# Patient Record
Sex: Male | Born: 1950 | Race: White | Hispanic: No | Smoking: Never smoker
Health system: Southern US, Community
[De-identification: ages and names within clinical notes are randomized; demographics above are authoritative.]

## PROBLEM LIST (undated history)

## (undated) DIAGNOSIS — Z974 Presence of external hearing-aid: Secondary | ICD-10-CM

## (undated) DIAGNOSIS — I10 Essential (primary) hypertension: Secondary | ICD-10-CM

## (undated) DIAGNOSIS — H3500 Unspecified background retinopathy: Secondary | ICD-10-CM

## (undated) DIAGNOSIS — K219 Gastro-esophageal reflux disease without esophagitis: Secondary | ICD-10-CM

## (undated) DIAGNOSIS — E785 Hyperlipidemia, unspecified: Secondary | ICD-10-CM

## (undated) DIAGNOSIS — M1990 Unspecified osteoarthritis, unspecified site: Secondary | ICD-10-CM

## (undated) HISTORY — DX: Essential (primary) hypertension: I10

## (undated) HISTORY — DX: Unspecified background retinopathy: H35.00

## (undated) HISTORY — PX: STAPEDES SURGERY: SHX789

## (undated) HISTORY — DX: Hyperlipidemia, unspecified: E78.5

## (undated) HISTORY — DX: Gastro-esophageal reflux disease without esophagitis: K21.9

---

## 2008-06-08 ENCOUNTER — Ambulatory Visit: Payer: Self-pay | Admitting: Unknown Physician Specialty

## 2008-08-26 ENCOUNTER — Ambulatory Visit: Payer: Self-pay | Admitting: Internal Medicine

## 2009-02-26 ENCOUNTER — Ambulatory Visit: Payer: Self-pay | Admitting: Internal Medicine

## 2010-09-15 ENCOUNTER — Emergency Department: Payer: Self-pay | Admitting: Emergency Medicine

## 2010-10-02 ENCOUNTER — Ambulatory Visit: Payer: Self-pay | Admitting: Surgery

## 2011-07-31 HISTORY — PX: COLONOSCOPY: SHX174

## 2011-08-11 ENCOUNTER — Ambulatory Visit: Payer: Self-pay

## 2011-08-11 LAB — URINALYSIS, COMPLETE
Bacteria: NEGATIVE
Bilirubin,UR: NEGATIVE
Glucose,UR: NEGATIVE mg/dL (ref 0–75)
Ketone: NEGATIVE
Specific Gravity: 1.015 (ref 1.003–1.030)
WBC UR: NONE SEEN /HPF (ref 0–5)

## 2011-08-11 LAB — OCCULT BLOOD X 1 CARD TO LAB, STOOL: Occult Blood, Feces: POSITIVE

## 2011-10-12 ENCOUNTER — Ambulatory Visit: Payer: Self-pay | Admitting: Unknown Physician Specialty

## 2011-10-15 LAB — PATHOLOGY REPORT

## 2014-03-01 DIAGNOSIS — H43819 Vitreous degeneration, unspecified eye: Secondary | ICD-10-CM | POA: Diagnosis not present

## 2014-03-08 DIAGNOSIS — Z79899 Other long term (current) drug therapy: Secondary | ICD-10-CM | POA: Diagnosis not present

## 2014-03-08 DIAGNOSIS — E785 Hyperlipidemia, unspecified: Secondary | ICD-10-CM | POA: Diagnosis not present

## 2014-03-08 DIAGNOSIS — I10 Essential (primary) hypertension: Secondary | ICD-10-CM | POA: Diagnosis not present

## 2014-03-08 DIAGNOSIS — K219 Gastro-esophageal reflux disease without esophagitis: Secondary | ICD-10-CM | POA: Diagnosis not present

## 2014-04-06 DIAGNOSIS — I10 Essential (primary) hypertension: Secondary | ICD-10-CM | POA: Diagnosis not present

## 2014-04-06 DIAGNOSIS — I498 Other specified cardiac arrhythmias: Secondary | ICD-10-CM | POA: Diagnosis not present

## 2014-04-06 DIAGNOSIS — E860 Dehydration: Secondary | ICD-10-CM | POA: Diagnosis not present

## 2014-04-09 ENCOUNTER — Ambulatory Visit: Payer: Self-pay | Admitting: Family Medicine

## 2014-04-09 DIAGNOSIS — R42 Dizziness and giddiness: Secondary | ICD-10-CM | POA: Diagnosis not present

## 2014-04-09 DIAGNOSIS — R7309 Other abnormal glucose: Secondary | ICD-10-CM | POA: Diagnosis not present

## 2014-04-09 LAB — CBC WITH DIFFERENTIAL/PLATELET
BASOS ABS: 0 10*3/uL (ref 0.0–0.1)
Basophil %: 0.4 %
EOS ABS: 0.1 10*3/uL (ref 0.0–0.7)
EOS PCT: 1.4 %
HCT: 50.7 % (ref 40.0–52.0)
HGB: 16.5 g/dL (ref 13.0–18.0)
Lymphocyte #: 1.8 10*3/uL (ref 1.0–3.6)
Lymphocyte %: 16.7 %
MCH: 29.5 pg (ref 26.0–34.0)
MCHC: 32.5 g/dL (ref 32.0–36.0)
MCV: 91 fL (ref 80–100)
MONO ABS: 0.9 x10 3/mm (ref 0.2–1.0)
Monocyte %: 8.4 %
Neutrophil #: 8 10*3/uL — ABNORMAL HIGH (ref 1.4–6.5)
Neutrophil %: 73.1 %
Platelet: 314 10*3/uL (ref 150–440)
RBC: 5.59 10*6/uL (ref 4.40–5.90)
RDW: 14 % (ref 11.5–14.5)
WBC: 10.9 10*3/uL — ABNORMAL HIGH (ref 3.8–10.6)

## 2014-04-09 LAB — RENAL FUNCTION PANEL
ANION GAP: 9 (ref 7–16)
Albumin: 4.1 g/dL (ref 3.4–5.0)
BUN: 14 mg/dL (ref 7–18)
CHLORIDE: 98 mmol/L (ref 98–107)
CO2: 30 mmol/L (ref 21–32)
CREATININE: 1.27 mg/dL (ref 0.60–1.30)
Calcium, Total: 9.3 mg/dL (ref 8.5–10.1)
EGFR (African American): >60
EGFR (Non-African Amer.): 59 — ABNORMAL LOW
Glucose: 129 mg/dL — ABNORMAL HIGH (ref 65–99)
OSMOLALITY: 276 (ref 275–301)
Phosphorus: 3 mg/dL (ref 2.5–4.9)
Potassium: 4.4 mmol/L (ref 3.5–5.1)
Sodium: 137 mmol/L (ref 136–145)

## 2014-04-09 LAB — HEMOGLOBIN A1C: HEMOGLOBIN A1C: 5.9 % (ref 4.2–6.3)

## 2014-04-14 DIAGNOSIS — H43819 Vitreous degeneration, unspecified eye: Secondary | ICD-10-CM | POA: Diagnosis not present

## 2014-04-14 DIAGNOSIS — H33309 Unspecified retinal break, unspecified eye: Secondary | ICD-10-CM | POA: Diagnosis not present

## 2014-04-14 DIAGNOSIS — H35719 Central serous chorioretinopathy, unspecified eye: Secondary | ICD-10-CM | POA: Diagnosis not present

## 2014-04-20 DIAGNOSIS — R42 Dizziness and giddiness: Secondary | ICD-10-CM | POA: Diagnosis not present

## 2014-04-20 DIAGNOSIS — J309 Allergic rhinitis, unspecified: Secondary | ICD-10-CM | POA: Diagnosis not present

## 2014-04-20 DIAGNOSIS — H902 Conductive hearing loss, unspecified: Secondary | ICD-10-CM | POA: Diagnosis not present

## 2014-04-20 DIAGNOSIS — H6120 Impacted cerumen, unspecified ear: Secondary | ICD-10-CM | POA: Diagnosis not present

## 2014-04-20 DIAGNOSIS — H8010 Otosclerosis involving oval window, obliterative, unspecified ear: Secondary | ICD-10-CM | POA: Diagnosis not present

## 2014-05-12 DIAGNOSIS — H33002 Unspecified retinal detachment with retinal break, left eye: Secondary | ICD-10-CM | POA: Diagnosis not present

## 2014-05-14 DIAGNOSIS — Z23 Encounter for immunization: Secondary | ICD-10-CM | POA: Diagnosis not present

## 2014-08-24 DIAGNOSIS — H6120 Impacted cerumen, unspecified ear: Secondary | ICD-10-CM | POA: Diagnosis not present

## 2014-08-24 DIAGNOSIS — H903 Sensorineural hearing loss, bilateral: Secondary | ICD-10-CM | POA: Diagnosis not present

## 2014-08-26 ENCOUNTER — Ambulatory Visit: Payer: Self-pay | Admitting: Family Medicine

## 2014-08-26 DIAGNOSIS — I10 Essential (primary) hypertension: Secondary | ICD-10-CM | POA: Diagnosis not present

## 2014-08-26 DIAGNOSIS — R000 Tachycardia, unspecified: Secondary | ICD-10-CM | POA: Diagnosis not present

## 2014-08-26 DIAGNOSIS — R6883 Chills (without fever): Secondary | ICD-10-CM | POA: Diagnosis not present

## 2014-08-26 DIAGNOSIS — K219 Gastro-esophageal reflux disease without esophagitis: Secondary | ICD-10-CM | POA: Diagnosis not present

## 2014-08-26 LAB — CBC WITH DIFFERENTIAL/PLATELET
BASOS PCT: 0.8 %
Basophil #: 0.1 10*3/uL (ref 0.0–0.1)
EOS ABS: 0.4 10*3/uL (ref 0.0–0.7)
Eosinophil %: 4.1 %
HCT: 45.1 % (ref 40.0–52.0)
HGB: 15.2 g/dL (ref 13.0–18.0)
Lymphocyte #: 2.8 10*3/uL (ref 1.0–3.6)
Lymphocyte %: 28.8 %
MCH: 29.3 pg (ref 26.0–34.0)
MCHC: 33.8 g/dL (ref 32.0–36.0)
MCV: 87 fL (ref 80–100)
MONO ABS: 1 x10 3/mm (ref 0.2–1.0)
Monocyte %: 9.7 %
NEUTROS ABS: 5.6 10*3/uL (ref 1.4–6.5)
Neutrophil %: 56.6 %
Platelet: 261 10*3/uL (ref 150–440)
RBC: 5.2 10*6/uL (ref 4.40–5.90)
RDW: 14.7 % — AB (ref 11.5–14.5)
WBC: 9.8 10*3/uL (ref 3.8–10.6)

## 2014-08-26 LAB — BASIC METABOLIC PANEL
ANION GAP: 10 (ref 7–16)
BUN: 16 mg/dL (ref 7–18)
CALCIUM: 8.8 mg/dL (ref 8.5–10.1)
CHLORIDE: 101 mmol/L (ref 98–107)
Co2: 26 mmol/L (ref 21–32)
Creatinine: 1.5 mg/dL — ABNORMAL HIGH (ref 0.60–1.30)
EGFR (African American): >60
EGFR (Non-African Amer.): 50 — ABNORMAL LOW
Glucose: 149 mg/dL — ABNORMAL HIGH (ref 65–99)
Osmolality: 278 (ref 275–301)
POTASSIUM: 3.7 mmol/L (ref 3.5–5.1)
Sodium: 137 mmol/L (ref 136–145)

## 2014-08-26 LAB — URINALYSIS, COMPLETE
BACTERIA: NEGATIVE
Bilirubin,UR: NEGATIVE
Blood: NEGATIVE
GLUCOSE, UR: NEGATIVE
Ketone: NEGATIVE
LEUKOCYTE ESTERASE: NEGATIVE
Nitrite: NEGATIVE
Ph: 6 (ref 5.0–8.0)
Protein: NEGATIVE
Specific Gravity: 1.005 (ref 1.000–1.030)
Squamous Epithelial: NONE SEEN
Transitional Epi: NONE SEEN
WBC UR: NONE SEEN /HPF (ref 0–5)

## 2014-08-26 LAB — TSH: Thyroid Stimulating Horm: 2.36 u[IU]/mL

## 2014-08-26 LAB — RAPID INFLUENZA A&B ANTIGENS

## 2014-09-14 DIAGNOSIS — R351 Nocturia: Secondary | ICD-10-CM | POA: Diagnosis not present

## 2014-09-14 DIAGNOSIS — E784 Other hyperlipidemia: Secondary | ICD-10-CM | POA: Diagnosis not present

## 2014-09-14 DIAGNOSIS — L309 Dermatitis, unspecified: Secondary | ICD-10-CM | POA: Diagnosis not present

## 2014-09-14 DIAGNOSIS — K219 Gastro-esophageal reflux disease without esophagitis: Secondary | ICD-10-CM | POA: Diagnosis not present

## 2014-09-14 DIAGNOSIS — I10 Essential (primary) hypertension: Secondary | ICD-10-CM | POA: Diagnosis not present

## 2014-09-14 DIAGNOSIS — Z1389 Encounter for screening for other disorder: Secondary | ICD-10-CM | POA: Diagnosis not present

## 2014-09-17 DIAGNOSIS — H33002 Unspecified retinal detachment with retinal break, left eye: Secondary | ICD-10-CM | POA: Diagnosis not present

## 2015-03-21 ENCOUNTER — Encounter: Payer: Self-pay | Admitting: Family Medicine

## 2015-03-21 ENCOUNTER — Ambulatory Visit (INDEPENDENT_AMBULATORY_CARE_PROVIDER_SITE_OTHER): Payer: Medicare Other | Admitting: Family Medicine

## 2015-03-21 ENCOUNTER — Other Ambulatory Visit: Payer: Self-pay

## 2015-03-21 VITALS — BP 130/80 | HR 80 | Ht 72.0 in | Wt 242.0 lb

## 2015-03-21 DIAGNOSIS — I10 Essential (primary) hypertension: Secondary | ICD-10-CM | POA: Insufficient documentation

## 2015-03-21 DIAGNOSIS — E785 Hyperlipidemia, unspecified: Secondary | ICD-10-CM | POA: Diagnosis not present

## 2015-03-21 DIAGNOSIS — H33002 Unspecified retinal detachment with retinal break, left eye: Secondary | ICD-10-CM | POA: Diagnosis not present

## 2015-03-21 DIAGNOSIS — K2100 Gastro-esophageal reflux disease with esophagitis, without bleeding: Secondary | ICD-10-CM | POA: Insufficient documentation

## 2015-03-21 MED ORDER — LANSOPRAZOLE 30 MG PO CPDR: 30.0000 mg | DELAYED_RELEASE_CAPSULE | Freq: Every day | ORAL | Status: DC

## 2015-03-21 MED ORDER — AMLODIPINE BESY-BENAZEPRIL HCL 10-20 MG PO CAPS: 1.0000 | ORAL_CAPSULE | Freq: Every day | ORAL | Status: DC

## 2015-03-21 MED ORDER — ATORVASTATIN CALCIUM 10 MG PO TABS
10.0000 mg | ORAL_TABLET | Freq: Every day | ORAL | Status: DC
Start: 2015-03-21 — End: 2015-09-19

## 2015-03-21 NOTE — Progress Notes (Signed)
Name: Juan Moreno   MRN: [id number]    DOB: [birth year]   Date:03/21/2015       Progress Note  Subjective  Chief Complaint  Chief Complaint  Patient presents with  . Gastrophageal Reflux  . Hypertension  . Hyperlipidemia    Gastrophageal Reflux He complains of heartburn. He reports no abdominal pain, no belching, no chest pain, no choking, no coughing, no dysphagia, no early satiety, no globus sensation, no hoarse voice, no nausea, no sore throat, no stridor, no tooth decay, no water brash or no wheezing. This is a recurrent problem. The current episode started more than 1 year ago. The problem occurs rarely. The problem has been gradually improving. The heartburn is located in the substernum. The heartburn is of mild intensity. The heartburn does not wake him from sleep. The heartburn does not limit his activity. The heartburn doesn't change with position. Nothing aggravates the symptoms. Pertinent negatives include no anemia, fatigue, melena, muscle weakness, orthopnea or weight loss. There are no known risk factors. Past procedures include an EGD.  Hypertension This is a chronic problem. The current episode started more than 1 year ago. The problem has been gradually improving since onset. Pertinent negatives include no anxiety, blurred vision, chest pain, headaches, malaise/fatigue, neck pain, orthopnea, palpitations, peripheral edema, PND, shortness of breath or sweats. There are no associated agents to hypertension. Risk factors for coronary artery disease include dyslipidemia and obesity. The current treatment provides moderate improvement. There are no compliance problems.  There is no history of angina, kidney disease, CAD/MI, CVA, heart failure, left ventricular hypertrophy, PVD, renovascular disease or retinopathy. There is no history of chronic renal disease.  Hyperlipidemia This is a chronic problem. The current episode started more than 1 year ago. The problem is controlled.  Recent lipid tests were reviewed and are normal. He has no history of chronic renal disease. There are no known factors aggravating his hyperlipidemia. Pertinent negatives include no chest pain, focal sensory loss, focal weakness, myalgias or shortness of breath. Current antihyperlipidemic treatment includes statins. The current treatment provides moderate improvement of lipids.    No problem-specific assessment & plan notes found for this encounter.   Past Medical History  Diagnosis Date  . Hypertension   . Hyperlipidemia   . GERD (gastroesophageal reflux disease)     Past Surgical History  Procedure Laterality Date  . Stapedes surgery    . Colonoscopy  2013    cleared for 4 yrs- Dr Vira Agar- [facility]    History reviewed. No pertinent family history.  Social History   Social History  . Marital Status: Married    Spouse Name: N/A  . Number of Children: N/A  . Years of Education: N/A   Occupational History  . Not on file.   Social History Main Topics  . Smoking status: Never Smoker   . Smokeless tobacco: Not on file  . Alcohol Use: 0.0 oz/week    0 Standard drinks or equivalent per week  . Drug Use: No  . Sexual Activity: No   Other Topics Concern  . Not on file   Social History Narrative  . No narrative on file    Allergies  Allergen Reactions  . Penicillins Rash     Review of Systems  Constitutional: Negative for fever, chills, weight loss, malaise/fatigue and fatigue.  HENT: Negative for ear discharge, ear pain, hoarse voice and sore throat.   Eyes: Negative for blurred vision.  Respiratory: Negative for cough, sputum production, choking, shortness  of breath and wheezing.   Cardiovascular: Negative for chest pain, palpitations, orthopnea, leg swelling and PND.  Gastrointestinal: Positive for heartburn. Negative for dysphagia, nausea, abdominal pain, diarrhea, constipation, blood in stool and melena.  Genitourinary: Negative for dysuria, urgency, frequency  and hematuria.  Musculoskeletal: Negative for myalgias, back pain, joint pain, muscle weakness and neck pain.  Skin: Negative for rash.  Neurological: Negative for dizziness, tingling, sensory change, focal weakness and headaches.  Endo/Heme/Allergies: Negative for environmental allergies and polydipsia. Does not bruise/bleed easily.  Psychiatric/Behavioral: Negative for depression and suicidal ideas. The patient is not nervous/anxious and does not have insomnia.      Objective  Filed Vitals:   03/21/15 0803  BP: 130/80  Pulse: 80  Height: 6' (1.829 m)  Weight: 242 lb (109.77 kg)    Physical Exam  Constitutional: He is oriented to person, place, and time and well-developed, well-nourished, and in no distress.  HENT:  Head: Normocephalic.  Right Ear: External ear normal.  Left Ear: External ear normal.  Nose: Nose normal.  Mouth/Throat: Oropharynx is clear and moist.  Eyes: Conjunctivae and EOM are normal. Pupils are equal, round, and reactive to light. Right eye exhibits no discharge. Left eye exhibits no discharge. No scleral icterus.  Neck: Normal range of motion. Neck supple. No JVD present. No tracheal deviation present. No thyromegaly present.  Cardiovascular: Normal rate, regular rhythm, normal heart sounds and intact distal pulses.  Exam reveals no gallop and no friction rub.   No murmur heard. Pulmonary/Chest: Breath sounds normal. No respiratory distress. He has no wheezes. He has no rales.  Abdominal: Soft. Bowel sounds are normal. He exhibits no mass. There is no hepatosplenomegaly. There is no tenderness. There is no rebound, no guarding and no CVA tenderness.  Musculoskeletal: Normal range of motion. He exhibits no edema or tenderness.  Lymphadenopathy:    He has no cervical adenopathy.  Neurological: He is alert and oriented to person, place, and time. He has normal sensation, normal strength, normal reflexes and intact cranial nerves. No cranial nerve deficit.   Skin: Skin is warm. No rash noted.  Psychiatric: Mood and affect normal.      Assessment & Plan  Problem List Items Addressed This Visit      Cardiovascular and Mediastinum   Essential hypertension - Primary   Relevant Medications   amLODipine-benazepril (LOTREL) 10-20 MG per capsule   atorvastatin (LIPITOR) 10 MG tablet   Other Relevant Orders   Renal Function Panel     Digestive   Gastroesophageal reflux disease with esophagitis   Relevant Medications   lansoprazole (PREVACID) 30 MG capsule     Other   Hyperlipidemia   Relevant Medications   amLODipine-benazepril (LOTREL) 10-20 MG per capsule   atorvastatin (LIPITOR) 10 MG tablet   Other Relevant Orders   Lipid Profile        Dr. Otilio Miu [facility]  03/21/2015

## 2015-03-22 LAB — LIPID PANEL
Chol/HDL Ratio: 2.9 ratio units (ref 0.0–5.0)
Cholesterol, Total: 127 mg/dL (ref 100–199)
HDL: 44 mg/dL (ref >39)
LDL Calculated: 61 mg/dL (ref 0–99)
TRIGLYCERIDES: 110 mg/dL (ref 0–149)
VLDL Cholesterol Cal: 22 mg/dL (ref 5–40)

## 2015-03-22 LAB — RENAL FUNCTION PANEL
Albumin: 4.5 g/dL (ref 3.6–4.8)
BUN/Creatinine Ratio: 10 (ref 10–22)
BUN: 12 mg/dL (ref 8–27)
CALCIUM: 9.3 mg/dL (ref 8.6–10.2)
CHLORIDE: 94 mmol/L — AB (ref 97–108)
CO2: 21 mmol/L (ref 18–29)
Creatinine, Ser: 1.21 mg/dL (ref 0.76–1.27)
GFR calc Af Amer: 72 mL/min/{1.73_m2} (ref >59)
GFR calc non Af Amer: 62 mL/min/{1.73_m2} (ref >59)
GLUCOSE: 71 mg/dL (ref 65–99)
PHOSPHORUS: 2.6 mg/dL (ref 2.5–4.5)
POTASSIUM: 4.4 mmol/L (ref 3.5–5.2)
SODIUM: 138 mmol/L (ref 134–144)

## 2015-04-11 ENCOUNTER — Ambulatory Visit (INDEPENDENT_AMBULATORY_CARE_PROVIDER_SITE_OTHER): Payer: Medicare Other | Admitting: Family Medicine

## 2015-04-11 ENCOUNTER — Encounter: Payer: Self-pay | Admitting: Family Medicine

## 2015-04-11 VITALS — BP 130/78 | HR 80 | Ht 72.0 in | Wt 247.0 lb

## 2015-04-11 DIAGNOSIS — R079 Chest pain, unspecified: Secondary | ICD-10-CM

## 2015-04-11 DIAGNOSIS — D229 Melanocytic nevi, unspecified: Secondary | ICD-10-CM

## 2015-04-11 DIAGNOSIS — E669 Obesity, unspecified: Secondary | ICD-10-CM | POA: Diagnosis not present

## 2015-04-11 DIAGNOSIS — E785 Hyperlipidemia, unspecified: Secondary | ICD-10-CM

## 2015-04-11 DIAGNOSIS — I10 Essential (primary) hypertension: Secondary | ICD-10-CM | POA: Diagnosis not present

## 2015-04-11 DIAGNOSIS — R05 Cough: Secondary | ICD-10-CM

## 2015-04-11 DIAGNOSIS — R059 Cough, unspecified: Secondary | ICD-10-CM

## 2015-04-11 MED ORDER — AMLODIPINE BESYLATE 10 MG PO TABS: 10.0000 mg | ORAL_TABLET | Freq: Every day | ORAL | Status: DC

## 2015-04-11 MED ORDER — LOSARTAN POTASSIUM 50 MG PO TABS: 50.0000 mg | ORAL_TABLET | Freq: Every day | ORAL | Status: DC

## 2015-04-11 NOTE — Progress Notes (Signed)
Name: Juan Moreno   MRN: [id number]    DOB: [birth year]   Date:04/11/2015       Progress Note  Subjective  Chief Complaint  Chief Complaint  Patient presents with  . Chest Pain    Chest Pain  This is a recurrent problem. The current episode started more than 1 year ago. The onset quality is gradual. The problem occurs intermittently. The problem has been waxing and waning. The pain is present in the lateral region. The pain is mild. Quality: "dull ache" The pain does not radiate. Associated symptoms include a cough. Pertinent negatives include no abdominal pain, back pain, diaphoresis, dizziness, exertional chest pressure, fever, headaches, irregular heartbeat, lower extremity edema, malaise/fatigue, nausea, near-syncope, palpitations, PND, shortness of breath or sputum production. Risk factors include male gender and obesity.  His past medical history is significant for hyperlipidemia and hypertension.  Pertinent negatives for past medical history include no CAD, no MI and no PE.    No problem-specific assessment & plan notes found for this encounter.   Past Medical History  Diagnosis Date  . Hypertension   . Hyperlipidemia   . GERD (gastroesophageal reflux disease)     Past Surgical History  Procedure Laterality Date  . Stapedes surgery    . Colonoscopy  2013    cleared for 4 yrs- Dr Vira Agar- [facility]    No family history on file.  Social History   Social History  . Marital Status: Married    Spouse Name: N/A  . Number of Children: N/A  . Years of Education: N/A   Occupational History  . Not on file.   Social History Main Topics  . Smoking status: Never Smoker   . Smokeless tobacco: Not on file  . Alcohol Use: 0.0 oz/week    0 Standard drinks or equivalent per week  . Drug Use: No  . Sexual Activity: No   Other Topics Concern  . Not on file   Social History Narrative    Allergies  Allergen Reactions  . Penicillins Rash     Review of Systems    Constitutional: Negative for fever, chills, weight loss, malaise/fatigue and diaphoresis.  HENT: Negative for ear discharge, ear pain and sore throat.   Eyes: Negative for blurred vision.  Respiratory: Positive for cough. Negative for sputum production, shortness of breath and wheezing.   Cardiovascular: Positive for chest pain. Negative for palpitations, leg swelling, PND and near-syncope.  Gastrointestinal: Negative for heartburn, nausea, abdominal pain, diarrhea, constipation, blood in stool and melena.  Genitourinary: Negative for dysuria, urgency, frequency and hematuria.  Musculoskeletal: Negative for myalgias, back pain, joint pain and neck pain.  Skin: Negative for rash.  Neurological: Negative for dizziness, tingling, sensory change, focal weakness and headaches.  Endo/Heme/Allergies: Negative for environmental allergies and polydipsia. Does not bruise/bleed easily.  Psychiatric/Behavioral: Negative for depression and suicidal ideas. The patient is not nervous/anxious and does not have insomnia.      Objective  Filed Vitals:   04/11/15 1459  BP: 130/78  Pulse: 80  Height: 6' (1.829 m)  Weight: 247 lb (112.038 kg)    Physical Exam  Constitutional: He is oriented to person, place, and time and well-developed, well-nourished, and in no distress.  HENT:  Head: Normocephalic.  Right Ear: External ear normal.  Left Ear: External ear normal.  Nose: Nose normal.  Mouth/Throat: Oropharynx is clear and moist.  Eyes: Conjunctivae and EOM are normal. Pupils are equal, round, and reactive to light. Right eye exhibits no  discharge. Left eye exhibits no discharge. No scleral icterus.  Neck: Normal range of motion. Neck supple. No JVD present. No tracheal deviation present. No thyromegaly present.  Cardiovascular: Normal rate, regular rhythm, normal heart sounds and intact distal pulses.  Exam reveals no gallop and no friction rub.   No murmur heard. Pulmonary/Chest: Breath sounds  normal. No respiratory distress. He has no wheezes. He has no rales.  Abdominal: Soft. Bowel sounds are normal. He exhibits no mass. There is no hepatosplenomegaly. There is no tenderness. There is no rebound, no guarding and no CVA tenderness.  Musculoskeletal: Normal range of motion. He exhibits no edema or tenderness.  No chest wall tenderness  Lymphadenopathy:    He has no cervical adenopathy.  Neurological: He is alert and oriented to person, place, and time. He has normal sensation, normal strength, normal reflexes and intact cranial nerves. No cranial nerve deficit.  Skin: Skin is warm. Lesion noted. No rash noted.     Nodular base with partial pigmentation  Psychiatric: Mood and affect normal.      Assessment & Plan  Problem List Items Addressed This Visit      Cardiovascular and Mediastinum   Essential hypertension   Relevant Medications   amLODipine (NORVASC) 10 MG tablet   losartan (COZAAR) 50 MG tablet     Other   Hyperlipidemia   Relevant Medications   amLODipine (NORVASC) 10 MG tablet   losartan (COZAAR) 50 MG tablet   Obesity    Other Visit Diagnoses    Chest pain at rest    -  Primary    Relevant Orders    EKG 12-Lead (Completed)    Ambulatory referral to Cardiology    Pigmented nevus        Relevant Orders    Ambulatory referral to Dermatology    Cough        likely ACE         Dr. Deanna Jones [facility]  04/11/2015

## 2015-04-14 DIAGNOSIS — L570 Actinic keratosis: Secondary | ICD-10-CM | POA: Diagnosis not present

## 2015-04-14 DIAGNOSIS — L821 Other seborrheic keratosis: Secondary | ICD-10-CM | POA: Diagnosis not present

## 2015-04-28 DIAGNOSIS — I209 Angina pectoris, unspecified: Secondary | ICD-10-CM | POA: Diagnosis not present

## 2015-04-28 DIAGNOSIS — E782 Mixed hyperlipidemia: Secondary | ICD-10-CM | POA: Diagnosis not present

## 2015-04-28 DIAGNOSIS — I495 Sick sinus syndrome: Secondary | ICD-10-CM | POA: Diagnosis not present

## 2015-04-28 DIAGNOSIS — I10 Essential (primary) hypertension: Secondary | ICD-10-CM | POA: Diagnosis not present

## 2015-05-12 DIAGNOSIS — I495 Sick sinus syndrome: Secondary | ICD-10-CM | POA: Diagnosis not present

## 2015-05-17 DIAGNOSIS — I495 Sick sinus syndrome: Secondary | ICD-10-CM | POA: Diagnosis not present

## 2015-05-17 DIAGNOSIS — E782 Mixed hyperlipidemia: Secondary | ICD-10-CM | POA: Diagnosis not present

## 2015-05-17 DIAGNOSIS — R0789 Other chest pain: Secondary | ICD-10-CM | POA: Diagnosis not present

## 2015-05-17 DIAGNOSIS — I209 Angina pectoris, unspecified: Secondary | ICD-10-CM | POA: Diagnosis not present

## 2015-05-17 DIAGNOSIS — I10 Essential (primary) hypertension: Secondary | ICD-10-CM | POA: Diagnosis not present

## 2015-08-22 ENCOUNTER — Other Ambulatory Visit: Payer: Self-pay

## 2015-08-29 ENCOUNTER — Ambulatory Visit (INDEPENDENT_AMBULATORY_CARE_PROVIDER_SITE_OTHER): Payer: Medicare Other | Admitting: Family Medicine

## 2015-08-29 ENCOUNTER — Encounter: Payer: Self-pay | Admitting: Family Medicine

## 2015-08-29 ENCOUNTER — Other Ambulatory Visit: Payer: Self-pay

## 2015-08-29 VITALS — BP 130/80 | HR 98 | Temp 98.0°F | Ht 72.0 in | Wt 245.0 lb

## 2015-08-29 DIAGNOSIS — J40 Bronchitis, not specified as acute or chronic: Secondary | ICD-10-CM | POA: Diagnosis not present

## 2015-08-29 DIAGNOSIS — J0100 Acute maxillary sinusitis, unspecified: Secondary | ICD-10-CM

## 2015-08-29 MED ORDER — GUAIFENESIN-CODEINE 100-10 MG/5ML PO SYRP: 5.0000 mL | ORAL_SOLUTION | Freq: Three times a day (TID) | ORAL | Status: DC | PRN

## 2015-08-29 MED ORDER — AZITHROMYCIN 250 MG PO TABS: ORAL_TABLET | ORAL | Status: DC

## 2015-08-29 NOTE — Progress Notes (Addendum)
Name: Juan Moreno   MRN: [id number]    DOB: 04/20/1951   Date:08/29/2015       Progress Note  Subjective  Chief Complaint  Chief Complaint  Patient presents with  . Sinusitis    cough and cong- thick production    Sinusitis This is a new problem. The current episode started in the past 7 days. The problem has been gradually worsening since onset. There has been no fever. He is experiencing no pain. Associated symptoms include congestion, coughing, sinus pressure and a sore throat. Pertinent negatives include no chills, diaphoresis, ear pain, headaches, hoarse voice, neck pain, shortness of breath, sneezing or swollen glands. Past treatments include nothing. The treatment provided mild relief.  Cough This is a new problem. The current episode started in the past 7 days. The problem has been gradually worsening. The cough is non-productive. Associated symptoms include a sore throat. Pertinent negatives include no chest pain, chills, ear congestion, ear pain, fever, headaches, heartburn, myalgias, rash, shortness of breath, weight loss or wheezing. There is no history of environmental allergies.    No problem-specific assessment & plan notes found for this encounter.   Past Medical History  Diagnosis Date  . Hypertension   . Hyperlipidemia   . GERD (gastroesophageal reflux disease)     Past Surgical History  Procedure Laterality Date  . Stapedes surgery    . Colonoscopy  2013    cleared for 4 yrs- Dr Vira Agar- [facility]    History reviewed. No pertinent family history.  Social History   Social History  . Marital Status: Married    Spouse Name: N/A  . Number of Children: N/A  . Years of Education: N/A   Occupational History  . Not on file.   Social History Main Topics  . Smoking status: Never Smoker   . Smokeless tobacco: Not on file  . Alcohol Use: 0.0 oz/week    0 Standard drinks or equivalent per week  . Drug Use: No  . Sexual Activity: No   Other Topics  Concern  . Not on file   Social History Narrative    Allergies  Allergen Reactions  . Penicillins Rash     Review of Systems  Constitutional: Negative for fever, chills, weight loss, malaise/fatigue and diaphoresis.  HENT: Positive for congestion, sinus pressure and sore throat. Negative for ear discharge, ear pain, hoarse voice and sneezing.   Eyes: Negative for blurred vision.  Respiratory: Positive for cough. Negative for sputum production, shortness of breath and wheezing.   Cardiovascular: Negative for chest pain, palpitations and leg swelling.  Gastrointestinal: Negative for heartburn, nausea, abdominal pain, diarrhea, constipation, blood in stool and melena.  Genitourinary: Negative for dysuria, urgency, frequency and hematuria.  Musculoskeletal: Negative for myalgias, back pain, joint pain and neck pain.  Skin: Negative for rash.  Neurological: Negative for dizziness, tingling, sensory change, focal weakness and headaches.  Endo/Heme/Allergies: Negative for environmental allergies and polydipsia. Does not bruise/bleed easily.  Psychiatric/Behavioral: Negative for depression and suicidal ideas. The patient is not nervous/anxious and does not have insomnia.      Objective  Filed Vitals:   08/29/15 1053  BP: 130/80  Pulse: 98  Temp: 98 F (36.7 C)  TempSrc: Oral  Height: 6' (1.829 m)  Weight: 245 lb (111.131 kg)    Physical Exam  Constitutional: He is oriented to person, place, and time and well-developed, well-nourished, and in no distress.  HENT:  Head: Normocephalic.  Right Ear: Tympanic membrane, external ear and  ear canal normal.  Left Ear: Tympanic membrane, external ear and ear canal normal.  Nose: Nose normal.  Mouth/Throat: Oropharynx is clear and moist.  Eyes: Conjunctivae and EOM are normal. Pupils are equal, round, and reactive to light. Right eye exhibits no discharge. Left eye exhibits no discharge. No scleral icterus.  Neck: Normal range of  motion. Neck supple. No JVD present. No tracheal deviation present. No thyromegaly present.  Cardiovascular: Normal rate, regular rhythm, normal heart sounds and intact distal pulses.  Exam reveals no gallop and no friction rub.   No murmur heard. Pulmonary/Chest: Breath sounds normal. No respiratory distress. He has no wheezes. He has no rales.  Abdominal: Soft. Bowel sounds are normal. He exhibits no mass. There is no hepatosplenomegaly. There is no tenderness. There is no rebound, no guarding and no CVA tenderness.  Musculoskeletal: Normal range of motion. He exhibits no edema or tenderness.  Lymphadenopathy:    He has no cervical adenopathy.  Neurological: He is alert and oriented to person, place, and time. He has normal sensation, normal strength, normal reflexes and intact cranial nerves. No cranial nerve deficit.  Skin: Skin is warm. No rash noted.  Psychiatric: Mood and affect normal.  Nursing note and vitals reviewed.     Assessment & Plan  Problem List Items Addressed This Visit    None    Visit Diagnoses    Bronchitis    -  Primary    Relevant Medications    azithromycin (ZITHROMAX) 250 MG tablet    Acute maxillary sinusitis, recurrence not specified        Relevant Medications    azithromycin (ZITHROMAX) 250 MG tablet    guaiFENesin-codeine (ROBITUSSIN AC) 100-10 MG/5ML syrup         Dr. Macon [facility]  08/29/2015

## 2015-09-19 ENCOUNTER — Ambulatory Visit (INDEPENDENT_AMBULATORY_CARE_PROVIDER_SITE_OTHER): Payer: Medicare Other | Admitting: Family Medicine

## 2015-09-19 ENCOUNTER — Encounter: Payer: Self-pay | Admitting: Family Medicine

## 2015-09-19 VITALS — BP 138/80 | HR 90 | Ht 72.0 in | Wt 247.0 lb

## 2015-09-19 DIAGNOSIS — K2100 Gastro-esophageal reflux disease with esophagitis, without bleeding: Secondary | ICD-10-CM

## 2015-09-19 DIAGNOSIS — Z23 Encounter for immunization: Secondary | ICD-10-CM | POA: Diagnosis not present

## 2015-09-19 DIAGNOSIS — I10 Essential (primary) hypertension: Secondary | ICD-10-CM | POA: Diagnosis not present

## 2015-09-19 DIAGNOSIS — Z1159 Encounter for screening for other viral diseases: Secondary | ICD-10-CM

## 2015-09-19 DIAGNOSIS — E785 Hyperlipidemia, unspecified: Secondary | ICD-10-CM | POA: Diagnosis not present

## 2015-09-19 MED ORDER — AMLODIPINE BESYLATE 10 MG PO TABS: 10.0000 mg | ORAL_TABLET | Freq: Every day | ORAL | Status: DC

## 2015-09-19 MED ORDER — LOSARTAN POTASSIUM 50 MG PO TABS: 50.0000 mg | ORAL_TABLET | Freq: Every day | ORAL | Status: DC

## 2015-09-19 MED ORDER — ATORVASTATIN CALCIUM 10 MG PO TABS: 10.0000 mg | ORAL_TABLET | Freq: Every day | ORAL | Status: DC

## 2015-09-19 MED ORDER — PANTOPRAZOLE SODIUM 40 MG PO TBEC: 40.0000 mg | DELAYED_RELEASE_TABLET | Freq: Every day | ORAL | Status: DC

## 2015-09-19 NOTE — Progress Notes (Signed)
Name: Juan Moreno   MRN: [id number]    DOB: 1951/04/10   Date:09/19/2015       Progress Note  Subjective  Chief Complaint  Chief Complaint  Patient presents with  . Hypertension  . Hyperlipidemia  . Gastroesophageal Reflux    change Prevacid to Protonix due to insurance    Hypertension This is a chronic problem. The current episode started more than 1 year ago. The problem is unchanged. The problem is controlled. Pertinent negatives include no anxiety, blurred vision, chest pain, headaches, malaise/fatigue, neck pain, orthopnea, palpitations, peripheral edema, PND, shortness of breath or sweats. There are no associated agents to hypertension. There are no known risk factors for coronary artery disease. Past treatments include calcium channel blockers and angiotensin blockers. The current treatment provides mild improvement. There are no compliance problems.  There is no history of angina, kidney disease, CAD/MI, CVA, heart failure, left ventricular hypertrophy, PVD, renovascular disease or retinopathy. There is no history of chronic renal disease or a hypertension causing med.  Hyperlipidemia This is a chronic problem. The current episode started more than 1 month ago. The problem is controlled. He has no history of chronic renal disease. There are no known factors aggravating his hyperlipidemia. Pertinent negatives include no chest pain, focal sensory loss, focal weakness, leg pain, myalgias or shortness of breath. Current antihyperlipidemic treatment includes statins. The current treatment provides moderate improvement of lipids. There are no compliance problems.   Gastroesophageal Reflux He complains of heartburn. He reports no abdominal pain, no chest pain, no coughing, no nausea, no sore throat or no wheezing. This is a recurrent problem. The problem has been waxing and waning. The heartburn duration is less than a minute. The heartburn is located in the substernum. The symptoms are  aggravated by certain foods. Pertinent negatives include no melena or weight loss. He has tried a histamine-2 antagonist for the symptoms. The treatment provided moderate relief. Past procedures do not include an abdominal ultrasound, an EGD, esophageal manometry, esophageal pH monitoring, H. pylori antibody titer or a UGI.    No problem-specific assessment & plan notes found for this encounter.   Past Medical History  Diagnosis Date  . Hypertension   . Hyperlipidemia   . GERD (gastroesophageal reflux disease)     Past Surgical History  Procedure Laterality Date  . Stapedes surgery    . Colonoscopy  2013    cleared for 4 yrs- Dr Vira Agar- [facility]    History reviewed. No pertinent family history.  Social History   Social History  . Marital Status: Married    Spouse Name: N/A  . Number of Children: N/A  . Years of Education: N/A   Occupational History  . Not on file.   Social History Main Topics  . Smoking status: Never Smoker   . Smokeless tobacco: Not on file  . Alcohol Use: 0.0 oz/week    0 Standard drinks or equivalent per week  . Drug Use: No  . Sexual Activity: No   Other Topics Concern  . Not on file   Social History Narrative    Allergies  Allergen Reactions  . Penicillins Rash     Review of Systems  Constitutional: Negative for fever, chills, weight loss and malaise/fatigue.  HENT: Negative for ear discharge, ear pain and sore throat.   Eyes: Negative for blurred vision.  Respiratory: Negative for cough, sputum production, shortness of breath and wheezing.   Cardiovascular: Negative for chest pain, palpitations, orthopnea, leg swelling and PND.  Gastrointestinal: Positive for heartburn. Negative for nausea, abdominal pain, diarrhea, constipation, blood in stool and melena.  Genitourinary: Negative for dysuria, urgency, frequency and hematuria.  Musculoskeletal: Negative for myalgias, back pain, joint pain and neck pain.  Skin: Negative for rash.   Neurological: Negative for dizziness, tingling, sensory change, focal weakness and headaches.  Endo/Heme/Allergies: Negative for environmental allergies and polydipsia. Does not bruise/bleed easily.  Psychiatric/Behavioral: Negative for depression and suicidal ideas. The patient is not nervous/anxious and does not have insomnia.      Objective  Filed Vitals:   09/19/15 0801  BP: 138/80  Pulse: 90  Height: 6' (1.829 m)  Weight: 247 lb (112.038 kg)    Physical Exam  Constitutional: He is oriented to person, place, and time and well-developed, well-nourished, and in no distress.  HENT:  Head: Normocephalic.  Right Ear: External ear normal.  Left Ear: External ear normal.  Nose: Nose normal.  Mouth/Throat: Oropharynx is clear and moist.  Eyes: Conjunctivae and EOM are normal. Pupils are equal, round, and reactive to light. Right eye exhibits no discharge. Left eye exhibits no discharge. No scleral icterus.  Neck: Normal range of motion. Neck supple. No JVD present. No tracheal deviation present. No thyromegaly present.  Cardiovascular: Normal rate, regular rhythm, normal heart sounds and intact distal pulses.  Exam reveals no gallop and no friction rub.   No murmur heard. Pulmonary/Chest: Breath sounds normal. No respiratory distress. He has no wheezes. He has no rales.  Abdominal: Soft. Bowel sounds are normal. He exhibits no mass. There is no hepatosplenomegaly. There is no tenderness. There is no rebound, no guarding and no CVA tenderness.  Musculoskeletal: Normal range of motion. He exhibits no edema or tenderness.  Lymphadenopathy:    He has no cervical adenopathy.  Neurological: He is alert and oriented to person, place, and time. He has normal sensation, normal strength and intact cranial nerves. No cranial nerve deficit.  Skin: Skin is warm. No rash noted.  Psychiatric: Mood and affect normal.      Assessment & Plan  Problem List Items Addressed This Visit       Cardiovascular and Mediastinum   Essential hypertension   Relevant Medications   losartan (COZAAR) 50 MG tablet   atorvastatin (LIPITOR) 10 MG tablet   amLODipine (NORVASC) 10 MG tablet     Digestive   Gastroesophageal reflux disease with esophagitis - Primary   Relevant Medications   pantoprazole (PROTONIX) 40 MG tablet     Other   Hyperlipidemia   Relevant Medications   losartan (COZAAR) 50 MG tablet   atorvastatin (LIPITOR) 10 MG tablet   amLODipine (NORVASC) 10 MG tablet    Other Visit Diagnoses    Need for hepatitis C screening test        Need for Tdap vaccination        Relevant Orders    Tdap vaccine greater than or equal to 7yo IM (Completed)    Need for pneumococcal vaccination        Relevant Orders    Pneumococcal conjugate vaccine 13-valent (Completed)         Dr. Macon [facility]  09/19/2015   -

## 2016-03-21 ENCOUNTER — Encounter: Payer: Self-pay | Admitting: Family Medicine

## 2016-03-21 ENCOUNTER — Ambulatory Visit (INDEPENDENT_AMBULATORY_CARE_PROVIDER_SITE_OTHER): Payer: Medicare Other | Admitting: Family Medicine

## 2016-03-21 VITALS — BP 120/82 | HR 64 | Ht 72.0 in | Wt 250.0 lb

## 2016-03-21 DIAGNOSIS — K2100 Gastro-esophageal reflux disease with esophagitis, without bleeding: Secondary | ICD-10-CM

## 2016-03-21 DIAGNOSIS — E785 Hyperlipidemia, unspecified: Secondary | ICD-10-CM | POA: Diagnosis not present

## 2016-03-21 DIAGNOSIS — I10 Essential (primary) hypertension: Secondary | ICD-10-CM

## 2016-03-21 LAB — POCT URINALYSIS DIPSTICK
Bilirubin, UA: NEGATIVE
Blood, UA: NEGATIVE
Glucose, UA: NEGATIVE
Ketones, UA: NEGATIVE
Leukocytes, UA: NEGATIVE
Nitrite, UA: NEGATIVE
Protein, UA: NEGATIVE
Spec Grav, UA: 1.015
Urobilinogen, UA: 0.2
pH, UA: 6

## 2016-03-21 MED ORDER — PANTOPRAZOLE SODIUM 40 MG PO TBEC: 40.0000 mg | DELAYED_RELEASE_TABLET | Freq: Every day | ORAL | 1 refills | Status: DC

## 2016-03-21 MED ORDER — AMLODIPINE BESYLATE 10 MG PO TABS: 10.0000 mg | ORAL_TABLET | Freq: Every day | ORAL | 1 refills | Status: DC

## 2016-03-21 MED ORDER — ATORVASTATIN CALCIUM 10 MG PO TABS: 10.0000 mg | ORAL_TABLET | Freq: Every day | ORAL | 1 refills | Status: DC

## 2016-03-21 MED ORDER — LOSARTAN POTASSIUM 50 MG PO TABS: 50.0000 mg | ORAL_TABLET | Freq: Every day | ORAL | 1 refills | Status: DC

## 2016-03-21 NOTE — Progress Notes (Signed)
Name: Juan Moreno   MRN: [id number]    DOB: 25-Jul-1951   Date:03/21/2016       Progress Note  Subjective  Chief Complaint  Chief Complaint  Patient presents with  . Gastroesophageal Reflux  . Hyperlipidemia  . Hypertension    Gastroesophageal Reflux  He reports no abdominal pain, no belching, no chest pain, no choking, no coughing, no dysphagia, no early satiety, no globus sensation, no heartburn, no hoarse voice, no nausea, no sore throat, no stridor, no tooth decay, no water brash or no wheezing. This is a chronic problem. The current episode started more than 1 year ago. The problem occurs rarely. The problem has been gradually improving. The symptoms are aggravated by certain foods. Pertinent negatives include no anemia, fatigue, melena, muscle weakness, orthopnea or weight loss. There are no known risk factors. He has tried a PPI for the symptoms. The treatment provided moderate relief.  Hyperlipidemia  This is a chronic problem. The current episode started more than 1 year ago. The problem is controlled. Recent lipid tests were reviewed and are normal. He has no history of chronic renal disease, diabetes, hypothyroidism, liver disease, obesity or nephrotic syndrome. Factors aggravating his hyperlipidemia include thiazides. Pertinent negatives include no chest pain, focal sensory loss, focal weakness, leg pain, myalgias or shortness of breath. Current antihyperlipidemic treatment includes statins. The current treatment provides mild improvement of lipids. There are no compliance problems.  There are no known risk factors for coronary artery disease.  Hypertension  This is a chronic problem. The current episode started more than 1 year ago. The problem has been waxing and waning since onset. The problem is controlled. Pertinent negatives include no anxiety, blurred vision, chest pain, headaches, malaise/fatigue, neck pain, orthopnea, palpitations, peripheral edema, PND, shortness of  breath or sweats. There are no associated agents to hypertension. There are no known risk factors for coronary artery disease. Past treatments include calcium channel blockers and angiotensin blockers. The current treatment provides moderate improvement. There are no compliance problems.  There is no history of angina, kidney disease, CAD/MI, CVA, heart failure, left ventricular hypertrophy, PVD, renovascular disease or retinopathy. There is no history of chronic renal disease or a hypertension causing med.    No problem-specific Assessment & Plan notes found for this encounter.   Past Medical History:  Diagnosis Date  . GERD (gastroesophageal reflux disease)   . Hyperlipidemia   . Hypertension     Past Surgical History:  Procedure Laterality Date  . COLONOSCOPY  2013   cleared for 4 yrs- Dr Vira Agar- [place]  . STAPEDES SURGERY      No family history on file.  Social History   Social History  . Marital status: Married    Spouse name: N/A  . Number of children: N/A  . Years of education: N/A   Occupational History  . Not on file.   Social History Main Topics  . Smoking status: Never Smoker  . Smokeless tobacco: Former Systems developer  . Alcohol use 0.0 oz/week  . Drug use: No  . Sexual activity: No   Other Topics Concern  . Not on file   Social History Narrative  . No narrative on file    Allergies  Allergen Reactions  . Penicillins Rash     Review of Systems  Constitutional: Negative for chills, fatigue, fever, malaise/fatigue and weight loss.  HENT: Negative for ear discharge, ear pain, hoarse voice and sore throat.   Eyes: Negative for blurred vision.  Respiratory:  Negative for cough, sputum production, choking, shortness of breath and wheezing.   Cardiovascular: Negative for chest pain, palpitations, orthopnea, leg swelling and PND.  Gastrointestinal: Negative for abdominal pain, blood in stool, constipation, diarrhea, dysphagia, heartburn, melena and nausea.   Genitourinary: Negative for dysuria, frequency, hematuria and urgency.  Musculoskeletal: Negative for back pain, joint pain, myalgias, muscle weakness and neck pain.  Skin: Negative for rash.  Neurological: Negative for dizziness, tingling, sensory change, focal weakness and headaches.  Endo/Heme/Allergies: Negative for environmental allergies and polydipsia. Does not bruise/bleed easily.  Psychiatric/Behavioral: Negative for depression and suicidal ideas. The patient is not nervous/anxious and does not have insomnia.      Objective  Vitals:   03/21/16 0758  BP: 120/82  Pulse: 64  Weight: 250 lb (113.4 kg)  Height: 6' (1.829 m)    Physical Exam  Constitutional: He is oriented to person, place, and time and well-developed, well-nourished, and in no distress.  HENT:  Head: Normocephalic.  Right Ear: External ear normal.  Left Ear: External ear normal.  Nose: Nose normal.  Mouth/Throat: Oropharynx is clear and moist.  Eyes: Conjunctivae and EOM are normal. Pupils are equal, round, and reactive to light. Right eye exhibits no discharge. Left eye exhibits no discharge. No scleral icterus.  Neck: Normal range of motion. Neck supple. No JVD present. No tracheal deviation present. No thyromegaly present.  Cardiovascular: Normal rate, regular rhythm, normal heart sounds and intact distal pulses.  Exam reveals no gallop and no friction rub.   No murmur heard. Pulmonary/Chest: Breath sounds normal. No respiratory distress. He has no wheezes. He has no rales.  Abdominal: Soft. Bowel sounds are normal. He exhibits no mass. There is no hepatosplenomegaly. There is no tenderness. There is no rebound, no guarding and no CVA tenderness.  Musculoskeletal: Normal range of motion. He exhibits no edema or tenderness.  Lymphadenopathy:    He has no cervical adenopathy.  Neurological: He is alert and oriented to person, place, and time. He has normal sensation, normal strength, normal reflexes and  intact cranial nerves. No cranial nerve deficit.  Skin: Skin is warm. No rash noted.  Psychiatric: Mood and affect normal.      Assessment & Plan  Problem List Items Addressed This Visit      Cardiovascular and Mediastinum   Essential hypertension - Primary   Relevant Medications   amLODipine (NORVASC) 10 MG tablet   losartan (COZAAR) 50 MG tablet   atorvastatin (LIPITOR) 10 MG tablet   Other Relevant Orders   Renal Function Panel   POCT urinalysis dipstick (Completed)     Digestive   Gastroesophageal reflux disease with esophagitis   Relevant Medications   pantoprazole (PROTONIX) 40 MG tablet     Other   Hyperlipidemia   Relevant Medications   amLODipine (NORVASC) 10 MG tablet   losartan (COZAAR) 50 MG tablet   atorvastatin (LIPITOR) 10 MG tablet   Other Relevant Orders   Lipid Profile    Other Visit Diagnoses   None.       Dr. [facility]  03/21/16

## 2016-03-22 LAB — RENAL FUNCTION PANEL
ALBUMIN: 4.5 g/dL (ref 3.6–4.8)
BUN/Creatinine Ratio: 10 (ref 10–24)
BUN: 12 mg/dL (ref 8–27)
CALCIUM: 9.5 mg/dL (ref 8.6–10.2)
CHLORIDE: 96 mmol/L (ref 96–106)
CO2: 25 mmol/L (ref 18–29)
Creatinine, Ser: 1.16 mg/dL (ref 0.76–1.27)
GFR calc Af Amer: 75 mL/min/{1.73_m2} (ref >59)
GFR calc non Af Amer: 65 mL/min/{1.73_m2} (ref >59)
GLUCOSE: 85 mg/dL (ref 65–99)
PHOSPHORUS: 3.2 mg/dL (ref 2.5–4.5)
POTASSIUM: 4.6 mmol/L (ref 3.5–5.2)
Sodium: 138 mmol/L (ref 134–144)

## 2016-03-22 LAB — LIPID PANEL
Chol/HDL Ratio: 2.7 ratio units (ref 0.0–5.0)
Cholesterol, Total: 118 mg/dL (ref 100–199)
HDL: 43 mg/dL (ref >39)
LDL Calculated: 52 mg/dL (ref 0–99)
Triglycerides: 116 mg/dL (ref 0–149)
VLDL Cholesterol Cal: 23 mg/dL (ref 5–40)

## 2016-04-16 DIAGNOSIS — Z872 Personal history of diseases of the skin and subcutaneous tissue: Secondary | ICD-10-CM | POA: Diagnosis not present

## 2016-04-16 DIAGNOSIS — D485 Neoplasm of uncertain behavior of skin: Secondary | ICD-10-CM | POA: Diagnosis not present

## 2016-04-16 DIAGNOSIS — Z08 Encounter for follow-up examination after completed treatment for malignant neoplasm: Secondary | ICD-10-CM | POA: Diagnosis not present

## 2016-04-16 DIAGNOSIS — Z1283 Encounter for screening for malignant neoplasm of skin: Secondary | ICD-10-CM | POA: Diagnosis not present

## 2016-04-16 DIAGNOSIS — L570 Actinic keratosis: Secondary | ICD-10-CM | POA: Diagnosis not present

## 2016-04-16 DIAGNOSIS — Z85828 Personal history of other malignant neoplasm of skin: Secondary | ICD-10-CM | POA: Diagnosis not present

## 2016-05-07 DIAGNOSIS — D485 Neoplasm of uncertain behavior of skin: Secondary | ICD-10-CM | POA: Diagnosis not present

## 2016-05-07 DIAGNOSIS — D225 Melanocytic nevi of trunk: Secondary | ICD-10-CM | POA: Diagnosis not present

## 2016-05-15 DIAGNOSIS — K219 Gastro-esophageal reflux disease without esophagitis: Secondary | ICD-10-CM | POA: Diagnosis not present

## 2016-05-15 DIAGNOSIS — I10 Essential (primary) hypertension: Secondary | ICD-10-CM | POA: Diagnosis not present

## 2016-05-15 DIAGNOSIS — E782 Mixed hyperlipidemia: Secondary | ICD-10-CM | POA: Diagnosis not present

## 2016-05-18 DIAGNOSIS — Z23 Encounter for immunization: Secondary | ICD-10-CM | POA: Diagnosis not present

## 2016-09-17 ENCOUNTER — Ambulatory Visit (INDEPENDENT_AMBULATORY_CARE_PROVIDER_SITE_OTHER): Payer: Medicare Other | Admitting: Family Medicine

## 2016-09-17 ENCOUNTER — Encounter: Payer: Self-pay | Admitting: Family Medicine

## 2016-09-17 VITALS — BP 130/80 | HR 80 | Ht 72.0 in | Wt 245.0 lb

## 2016-09-17 DIAGNOSIS — Z1211 Encounter for screening for malignant neoplasm of colon: Secondary | ICD-10-CM | POA: Diagnosis not present

## 2016-09-17 DIAGNOSIS — Z1159 Encounter for screening for other viral diseases: Secondary | ICD-10-CM

## 2016-09-17 DIAGNOSIS — K2100 Gastro-esophageal reflux disease with esophagitis, without bleeding: Secondary | ICD-10-CM

## 2016-09-17 DIAGNOSIS — Z6833 Body mass index (BMI) 33.0-33.9, adult: Secondary | ICD-10-CM | POA: Diagnosis not present

## 2016-09-17 DIAGNOSIS — I10 Essential (primary) hypertension: Secondary | ICD-10-CM

## 2016-09-17 DIAGNOSIS — E782 Mixed hyperlipidemia: Secondary | ICD-10-CM

## 2016-09-17 DIAGNOSIS — E669 Obesity, unspecified: Secondary | ICD-10-CM | POA: Diagnosis not present

## 2016-09-17 LAB — HEMOCCULT GUIAC POC 1CARD (OFFICE): Fecal Occult Blood, POC: NEGATIVE

## 2016-09-17 MED ORDER — PANTOPRAZOLE SODIUM 40 MG PO TBEC: 40.0000 mg | DELAYED_RELEASE_TABLET | Freq: Every day | ORAL | 1 refills | Status: DC

## 2016-09-17 MED ORDER — LOSARTAN POTASSIUM 50 MG PO TABS: 50.0000 mg | ORAL_TABLET | Freq: Every day | ORAL | 1 refills | Status: DC

## 2016-09-17 MED ORDER — ATORVASTATIN CALCIUM 10 MG PO TABS: 10.0000 mg | ORAL_TABLET | Freq: Every day | ORAL | 1 refills | Status: DC

## 2016-09-17 MED ORDER — AMLODIPINE BESYLATE 10 MG PO TABS: 10.0000 mg | ORAL_TABLET | Freq: Every day | ORAL | 1 refills | Status: DC

## 2016-09-17 NOTE — Patient Instructions (Signed)

## 2016-09-17 NOTE — Progress Notes (Signed)
Name: Juan Moreno   MRN: [id number]    DOB: Nov 08, 1950   Date:09/17/2016       Progress Note  Subjective  Chief Complaint  Chief Complaint  Patient presents with  . Hypertension  . Hyperlipidemia  . Gastroesophageal Reflux    Hypertension  This is a chronic problem. The current episode started more than 1 year ago. The problem has been gradually improving since onset. The problem is controlled. Pertinent negatives include no anxiety, blurred vision, chest pain, headaches, malaise/fatigue, neck pain, orthopnea, palpitations, peripheral edema, PND, shortness of breath or sweats. There are no associated agents to hypertension. Risk factors for coronary artery disease include male gender, dyslipidemia and obesity. Past treatments include angiotensin blockers and calcium channel blockers. The current treatment provides moderate improvement. There are no compliance problems.  There is no history of angina, kidney disease, CAD/MI, heart failure, left ventricular hypertrophy, PVD, renovascular disease or retinopathy. There is no history of chronic renal disease or a hypertension causing med.  Hyperlipidemia  This is a chronic problem. The current episode started more than 1 year ago. The problem is controlled. Recent lipid tests were reviewed and are normal. He has no history of chronic renal disease, diabetes, hypothyroidism, liver disease, obesity or nephrotic syndrome. There are no known factors aggravating his hyperlipidemia. Pertinent negatives include no chest pain, focal sensory loss, focal weakness, leg pain, myalgias or shortness of breath. Current antihyperlipidemic treatment includes statins. The current treatment provides moderate improvement of lipids. There are no compliance problems.   Gastroesophageal Reflux  He reports no abdominal pain, no belching, no chest pain, no choking, no coughing, no dysphagia, no early satiety, no globus sensation, no heartburn, no hoarse voice, no  nausea, no sore throat, no stridor, no tooth decay, no water brash or no wheezing. This is a recurrent problem. The problem occurs frequently. The problem has been gradually worsening. The symptoms are aggravated by certain foods. Pertinent negatives include no anemia, fatigue, melena, muscle weakness, orthopnea or weight loss. There are no known risk factors. He has tried a PPI for the symptoms. The treatment provided mild relief.    No problem-specific Assessment & Plan notes found for this encounter.   Past Medical History:  Diagnosis Date  . GERD (gastroesophageal reflux disease)   . Hyperlipidemia   . Hypertension     Past Surgical History:  Procedure Laterality Date  . COLONOSCOPY  2013   cleared for 4 yrs- Dr Vira Agar- [place]  . STAPEDES SURGERY      No family history on file.  Social History   Social History  . Marital status: Married    Spouse name: N/A  . Number of children: N/A  . Years of education: N/A   Occupational History  . Not on file.   Social History Main Topics  . Smoking status: Never Smoker  . Smokeless tobacco: Former Systems developer  . Alcohol use 0.0 oz/week  . Drug use: No  . Sexual activity: No   Other Topics Concern  . Not on file   Social History Narrative  . No narrative on file    Allergies  Allergen Reactions  . Penicillins Rash     Review of Systems  Constitutional: Negative for chills, fatigue, fever, malaise/fatigue and weight loss.  HENT: Negative for ear discharge, ear pain, hoarse voice and sore throat.   Eyes: Negative for blurred vision.  Respiratory: Negative for cough, sputum production, choking, shortness of breath and wheezing.   Cardiovascular: Negative for chest  pain, palpitations, orthopnea, leg swelling and PND.  Gastrointestinal: Negative for abdominal pain, blood in stool, constipation, diarrhea, dysphagia, heartburn, melena and nausea.  Genitourinary: Negative for dysuria, frequency, hematuria and urgency.   Musculoskeletal: Negative for back pain, joint pain, myalgias, muscle weakness and neck pain.  Skin: Negative for rash.  Neurological: Negative for dizziness, tingling, sensory change, focal weakness and headaches.  Endo/Heme/Allergies: Negative for environmental allergies and polydipsia. Does not bruise/bleed easily.  Psychiatric/Behavioral: Negative for depression and suicidal ideas. The patient is not nervous/anxious and does not have insomnia.      Objective  Vitals:   09/17/16 0809  BP: 130/80  Pulse: 80  Weight: 245 lb (111.1 kg)  Height: 6' (1.829 m)    Physical Exam  Constitutional: He is oriented to person, place, and time and well-developed, well-nourished, and in no distress.  HENT:  Head: Normocephalic.  Right Ear: Tympanic membrane, external ear and ear canal normal.  Left Ear: Tympanic membrane, external ear and ear canal normal.  Nose: Nose normal. No mucosal edema.  Mouth/Throat: Uvula is midline and oropharynx is clear and moist. No oropharyngeal exudate, posterior oropharyngeal edema or posterior oropharyngeal erythema.  Eyes: Conjunctivae and EOM are normal. Pupils are equal, round, and reactive to light. Right eye exhibits no discharge. Left eye exhibits no discharge. No scleral icterus.  Neck: Trachea normal and normal range of motion. Neck supple. Normal carotid pulses, no hepatojugular reflux and no JVD present. Carotid bruit is not present. No tracheal deviation present. No thyromegaly present.  Cardiovascular: Normal rate, regular rhythm, S1 normal, S2 normal, normal heart sounds and intact distal pulses.  Exam reveals no gallop, no S3, no S4 and no friction rub.   No murmur heard. Pulmonary/Chest: Breath sounds normal. No respiratory distress. He has no decreased breath sounds. He has no wheezes. He has no rhonchi. He has no rales.  Abdominal: Soft. Bowel sounds are normal. He exhibits no mass. There is no hepatosplenomegaly. There is no tenderness. There is  no rebound, no guarding and no CVA tenderness.  Genitourinary: Rectum normal and prostate normal. Rectal exam shows guaiac negative stool.  Musculoskeletal: Normal range of motion. He exhibits no edema or tenderness.  Lymphadenopathy:       Head (right side): No submental and no submandibular adenopathy present.       Head (left side): No submental and no submandibular adenopathy present.    He has no cervical adenopathy.    He has no axillary adenopathy.  Neurological: He is alert and oriented to person, place, and time. He has normal sensation, normal strength, normal reflexes and intact cranial nerves. No cranial nerve deficit.  Skin: Skin is warm. No rash noted.  Psychiatric: Mood and affect normal.  Nursing note and vitals reviewed.     Assessment & Plan  Problem List Items Addressed This Visit      Cardiovascular and Mediastinum   Essential hypertension - Primary   Relevant Medications   losartan (COZAAR) 50 MG tablet   amLODipine (NORVASC) 10 MG tablet   atorvastatin (LIPITOR) 10 MG tablet   Other Relevant Orders   Renal function panel     Digestive   Gastroesophageal reflux disease with esophagitis   Relevant Medications   pantoprazole (PROTONIX) 40 MG tablet     Other   Hyperlipidemia   Relevant Medications   losartan (COZAAR) 50 MG tablet   amLODipine (NORVASC) 10 MG tablet   atorvastatin (LIPITOR) 10 MG tablet   Other Relevant Orders  Lipid panel   Obesity   Relevant Orders   Lipid panel   Colon cancer screening   Relevant Orders   POCT occult blood stool (Completed)    Other Visit Diagnoses    Need for hepatitis C screening test       Relevant Orders   Hepatitis C antibody        Dr. [facility]  09/17/16

## 2016-09-18 LAB — RENAL FUNCTION PANEL
Albumin: 4.8 g/dL (ref 3.6–4.8)
BUN / CREAT RATIO: 11 (ref 10–24)
BUN: 14 mg/dL (ref 8–27)
CO2: 25 mmol/L (ref 18–29)
CREATININE: 1.24 mg/dL (ref 0.76–1.27)
Calcium: 9.8 mg/dL (ref 8.6–10.2)
Chloride: 95 mmol/L — ABNORMAL LOW (ref 96–106)
GFR, EST AFRICAN AMERICAN: 69 mL/min/{1.73_m2} (ref >59)
GFR, EST NON AFRICAN AMERICAN: 60 mL/min/{1.73_m2} (ref >59)
GLUCOSE: 76 mg/dL (ref 65–99)
PHOSPHORUS: 2.9 mg/dL (ref 2.5–4.5)
Potassium: 4.6 mmol/L (ref 3.5–5.2)
SODIUM: 139 mmol/L (ref 134–144)

## 2016-09-18 LAB — HEPATITIS C ANTIBODY

## 2016-09-18 LAB — LIPID PANEL
Chol/HDL Ratio: 2.8 ratio units (ref 0.0–5.0)
Cholesterol, Total: 119 mg/dL (ref 100–199)
HDL: 42 mg/dL (ref >39)
LDL CALC: 51 mg/dL (ref 0–99)
TRIGLYCERIDES: 129 mg/dL (ref 0–149)
VLDL Cholesterol Cal: 26 mg/dL (ref 5–40)

## 2016-10-22 DIAGNOSIS — Z86010 Personal history of colonic polyps: Secondary | ICD-10-CM | POA: Diagnosis not present

## 2016-10-22 DIAGNOSIS — K219 Gastro-esophageal reflux disease without esophagitis: Secondary | ICD-10-CM | POA: Diagnosis not present

## 2016-11-05 DIAGNOSIS — Z859 Personal history of malignant neoplasm, unspecified: Secondary | ICD-10-CM | POA: Diagnosis not present

## 2016-11-05 DIAGNOSIS — L570 Actinic keratosis: Secondary | ICD-10-CM | POA: Diagnosis not present

## 2016-11-05 DIAGNOSIS — Z86018 Personal history of other benign neoplasm: Secondary | ICD-10-CM | POA: Diagnosis not present

## 2016-11-12 DIAGNOSIS — H43813 Vitreous degeneration, bilateral: Secondary | ICD-10-CM | POA: Diagnosis not present

## 2016-12-26 ENCOUNTER — Encounter
Admission: RE | Disposition: A | Discharge status: Home / Self Care | Payer: Self-pay | Source: Ambulatory Visit | Attending: Unknown Physician Specialty

## 2016-12-26 ENCOUNTER — Ambulatory Visit: Payer: Medicare Other | Admitting: Certified Registered"

## 2016-12-26 ENCOUNTER — Ambulatory Visit
Admission: RE | Admit: 2016-12-26 | Discharge: 2016-12-26 | Disposition: A | Discharge status: Home / Self Care | Payer: Medicare Other | Source: Ambulatory Visit | Attending: Unknown Physician Specialty | Admitting: Unknown Physician Specialty

## 2016-12-26 DIAGNOSIS — Z7982 Long term (current) use of aspirin: Secondary | ICD-10-CM | POA: Diagnosis not present

## 2016-12-26 DIAGNOSIS — K219 Gastro-esophageal reflux disease without esophagitis: Secondary | ICD-10-CM | POA: Insufficient documentation

## 2016-12-26 DIAGNOSIS — E785 Hyperlipidemia, unspecified: Secondary | ICD-10-CM | POA: Diagnosis not present

## 2016-12-26 DIAGNOSIS — I10 Essential (primary) hypertension: Secondary | ICD-10-CM | POA: Diagnosis not present

## 2016-12-26 DIAGNOSIS — K5730 Diverticulosis of large intestine without perforation or abscess without bleeding: Secondary | ICD-10-CM | POA: Diagnosis not present

## 2016-12-26 DIAGNOSIS — K640 First degree hemorrhoids: Secondary | ICD-10-CM | POA: Diagnosis not present

## 2016-12-26 DIAGNOSIS — K648 Other hemorrhoids: Secondary | ICD-10-CM | POA: Diagnosis not present

## 2016-12-26 DIAGNOSIS — Z880 Allergy status to penicillin: Secondary | ICD-10-CM | POA: Insufficient documentation

## 2016-12-26 DIAGNOSIS — Z1211 Encounter for screening for malignant neoplasm of colon: Secondary | ICD-10-CM | POA: Diagnosis not present

## 2016-12-26 DIAGNOSIS — Z86010 Personal history of colonic polyps: Secondary | ICD-10-CM | POA: Insufficient documentation

## 2016-12-26 DIAGNOSIS — Z79899 Other long term (current) drug therapy: Secondary | ICD-10-CM | POA: Insufficient documentation

## 2016-12-26 HISTORY — PX: COLONOSCOPY WITH PROPOFOL: SHX5780

## 2016-12-26 SURGERY — COLONOSCOPY WITH PROPOFOL
Anesthesia: General

## 2016-12-26 MED ORDER — LIDOCAINE 2% (20 MG/ML) 5 ML SYRINGE
INTRAMUSCULAR | Status: DC | PRN
  Administered 2016-12-26: 60 mg via INTRAVENOUS

## 2016-12-26 MED ORDER — PROPOFOL 10 MG/ML IV BOLUS
INTRAVENOUS | Status: DC | PRN
  Administered 2016-12-26: 60 mg via INTRAVENOUS

## 2016-12-26 MED ORDER — SODIUM CHLORIDE 0.9 % IV SOLN
INTRAVENOUS | Status: DC
  Administered 2016-12-26: 1000 mL via INTRAVENOUS

## 2016-12-26 MED ORDER — PROPOFOL 500 MG/50ML IV EMUL
INTRAVENOUS | Status: DC | PRN
  Administered 2016-12-26: 120 ug/kg/min via INTRAVENOUS

## 2016-12-26 MED ORDER — PROPOFOL 500 MG/50ML IV EMUL
INTRAVENOUS | Status: AC
  Filled 2016-12-26: qty 50

## 2016-12-26 MED ORDER — SODIUM CHLORIDE 0.9 % IV SOLN: INTRAVENOUS | Status: DC

## 2016-12-26 NOTE — OR Nursing (Signed)
IV removed frp, Right hand with 22-gauge angiocath.  Catheter intact.  Site without redness or swelling.

## 2016-12-26 NOTE — Anesthesia Post-op Follow-up Note (Cosign Needed)
Anesthesia QCDR form completed.        

## 2016-12-26 NOTE — Anesthesia Preprocedure Evaluation (Signed)
Anesthesia Evaluation  Patient identified by MRN, date of birth, ID band Patient awake    Reviewed: Allergy & Precautions, NPO status , Patient's Chart, lab work & pertinent test results  Airway Mallampati: II       Dental  (+) Teeth Intact   Pulmonary neg pulmonary ROS,    Pulmonary exam normal        Cardiovascular hypertension, Pt. on medications  Rhythm:Regular     Neuro/Psych    GI/Hepatic Neg liver ROS, GERD  Medicated,  Endo/Other  negative endocrine ROS  Renal/GU negative Renal ROS     Musculoskeletal negative musculoskeletal ROS (+)   Abdominal Normal abdominal exam  (+)   Peds negative pediatric ROS (+)  Hematology negative hematology ROS (+)   Anesthesia Other Findings   Reproductive/Obstetrics                             Anesthesia Physical Anesthesia Plan  ASA: II  Anesthesia Plan: General   Post-op Pain Management:    Induction: Intravenous  Airway Management Planned: Natural Airway and Nasal Cannula  Additional Equipment:   Intra-op Plan:   Post-operative Plan:   Informed Consent: I have reviewed the patients History and Physical, chart, labs and discussed the procedure including the risks, benefits and alternatives for the proposed anesthesia with the patient or authorized representative who has indicated his/her understanding and acceptance.     Plan Discussed with: CRNA  Anesthesia Plan Comments:         Anesthesia Quick Evaluation

## 2016-12-26 NOTE — Transfer of Care (Addendum)
Immediate Anesthesia Transfer of Care Note  Patient: Juan Moreno  Procedure(s) Performed: Procedure(s): COLONOSCOPY WITH PROPOFOL (N/A)  Patient Location: PACU  Anesthesia Type:General  Level of Consciousness: sedated  Airway & Oxygen Therapy: Patient Spontanous Breathing and Patient connected to nasal cannula oxygen  Post-op Assessment: Report given to RN and Post -op Vital signs reviewed and stable  Post vital signs: Reviewed and stable  Last Vitals:  Vitals:   12/26/16 1434  BP: (!) 148/87  Pulse: 97  Resp: 20  Temp: 36.4 C    Last Pain:  Vitals:   12/26/16 1434  TempSrc: Oral      Patients Stated Pain Goal: 0 (47/20/72 1828)  Complications: No apparent anesthesia complications

## 2016-12-26 NOTE — H&P (Signed)
   Primary Care Physician:  Juline Patch, MD Primary Gastroenterologist:  Dr. Vira Agar  Pre-Procedure History & Physical: HPI:  Juan Moreno is a 66 y.o. male is here for an colonoscopy.   Past Medical History:  Diagnosis Date  . GERD (gastroesophageal reflux disease)   . Hyperlipidemia   . Hypertension     Past Surgical History:  Procedure Laterality Date  . COLONOSCOPY  2013   cleared for 4 yrs- Dr Vira Agar- Divert  . STAPEDES SURGERY      Prior to Admission medications   Medication Sig Start Date End Date Taking? Authorizing Provider  amLODipine (NORVASC) 10 MG tablet Take 1 tablet (10 mg total) by mouth daily. 09/17/16  Yes Juline Patch, MD  aspirin EC 81 MG tablet Take 81 mg by mouth daily.   Yes [provider]  atorvastatin (LIPITOR) 10 MG tablet Take 1 tablet (10 mg total) by mouth daily at 6 (six) AM. 09/17/16  Yes Juline Patch, MD  loratadine (CLARITIN) 10 MG tablet Take 10 mg by mouth daily.   Yes [provider]  losartan (COZAAR) 50 MG tablet Take 1 tablet (50 mg total) by mouth daily. 09/17/16  Yes Juline Patch, MD  pantoprazole (PROTONIX) 40 MG tablet Take 1 tablet (40 mg total) by mouth daily. 09/17/16  Yes Juline Patch, MD    Allergies as of 11/09/2016 - Review Complete 09/17/2016  Allergen Reaction Noted  . Penicillins Rash 03/21/2015    No family history on file.  Social History   Social History  . Marital status: Married    Spouse name: N/A  . Number of children: N/A  . Years of education: N/A   Occupational History  . Not on file.   Social History Main Topics  . Smoking status: Never Smoker  . Smokeless tobacco: Former Systems developer  . Alcohol use 0.0 oz/week  . Drug use: No  . Sexual activity: No   Other Topics Concern  . Not on file   Social History Narrative  . No narrative on file    Review of Systems: See HPI, otherwise negative ROS  Physical Exam: BP (!) 148/87   Pulse 97   Temp 97.5 F (36.4  C) (Oral)   Resp 20   Ht 6' (1.829 m)   Wt 113.4 kg (250 lb)   SpO2 100%   BMI 33.91 kg/m  General:   Alert,  pleasant and cooperative in NAD Head:  Normocephalic and atraumatic. Neck:  Supple; no masses or thyromegaly. Lungs:  Clear throughout to auscultation.    Heart:  Regular rate and rhythm. Abdomen:  Soft, nontender and nondistended. Normal bowel sounds, without guarding, and without rebound.   Neurologic:  Alert and  oriented x4;  grossly normal neurologically.  Impression/Plan: Juan Moreno is here for an colonoscopy to be performed for [facility] colon polyps  Risks, benefits, limitations, and alternatives regarding  colonoscopy have been reviewed with the patient.  Questions have been answered.  All parties agreeable.   Gaylyn Cheers, MD  12/26/2016, 3:07 PM

## 2016-12-26 NOTE — Anesthesia Postprocedure Evaluation (Signed)
Anesthesia Post Note  Patient: Kaulana Brindle  Procedure(s) Performed: Procedure(s) (LRB): COLONOSCOPY WITH PROPOFOL (N/A)  Patient location during evaluation: Endoscopy Anesthesia Type: General Level of consciousness: awake and alert Pain management: pain level controlled Vital Signs Assessment: post-procedure vital signs reviewed and stable Respiratory status: spontaneous breathing, nonlabored ventilation, respiratory function stable and patient connected to nasal cannula oxygen Cardiovascular status: blood pressure returned to baseline and stable Postop Assessment: no signs of nausea or vomiting Anesthetic complications: no     Last Vitals:  Vitals:   12/26/16 1554 12/26/16 1604  BP: (!) 140/94 140/84  Pulse: 93 85  Resp: 16 16  Temp:      Last Pain:  Vitals:   12/26/16 1434  TempSrc: Oral                 Precious Haws Buzz Axel

## 2016-12-26 NOTE — Op Note (Signed)
[facility] Gastroenterology Patient Name: Juan Moreno Procedure Date: 12/26/2016 3:08 PM MRN: [id number] Account #: [id number] Date of Birth: May 14, 1951 Admit Type: Outpatient Age: 66 Room: [facility] ENDO ROOM 3 Gender: Male Note Status: Finalized Procedure:            Colonoscopy Indications:          High risk colon cancer surveillance: Personal history                        of colonic polyps Providers:            Manya Silvas, MD Referring MD:         Juline Patch, MD (Referring MD) Medicines:            Propofol per Anesthesia Complications:        No immediate complications. Procedure:            Pre-Anesthesia Assessment:                       - After reviewing the risks and benefits, the patient                        was deemed in satisfactory condition to undergo the                        procedure.                       After obtaining informed consent, the colonoscope was                        passed under direct vision. Throughout the procedure,                        the patient's blood pressure, pulse, and oxygen                        saturations were monitored continuously. The                        Colonoscope was introduced through the anus and                        advanced to the the cecum, identified by appendiceal                        orifice and ileocecal valve. The colonoscopy was                        performed without difficulty. The patient tolerated the                        procedure well. The quality of the bowel preparation                        was excellent. Findings:      A few small-mouthed diverticula were found in the sigmoid colon.      Internal hemorrhoids were found during endoscopy. The hemorrhoids were       small and Grade I (internal hemorrhoids that do not prolapse).      The exam was  otherwise without abnormality. Impression:           - Diverticulosis in the sigmoid colon.                       -  Internal hemorrhoids.                       - The examination was otherwise normal.                       - No specimens collected. Recommendation:       - Repeat colonoscopy in 5 years for surveillance. Manya Silvas, MD 12/26/2016 3:26:21 PM This report has been signed electronically. Number of Addenda: 0 Note Initiated On: 12/26/2016 3:08 PM Scope Withdrawal Time: 0 hours 6 minutes 37 seconds  Total Procedure Duration: 0 hours 10 minutes 19 seconds       [facility]

## 2016-12-27 ENCOUNTER — Encounter: Payer: Self-pay | Admitting: Unknown Physician Specialty

## 2017-03-20 ENCOUNTER — Encounter: Payer: Self-pay | Admitting: Family Medicine

## 2017-03-20 ENCOUNTER — Ambulatory Visit (INDEPENDENT_AMBULATORY_CARE_PROVIDER_SITE_OTHER): Payer: Medicare Other | Admitting: Family Medicine

## 2017-03-20 VITALS — BP 120/80 | HR 80 | Ht 72.0 in | Wt 257.0 lb

## 2017-03-20 DIAGNOSIS — R351 Nocturia: Secondary | ICD-10-CM

## 2017-03-20 DIAGNOSIS — E782 Mixed hyperlipidemia: Secondary | ICD-10-CM

## 2017-03-20 DIAGNOSIS — I10 Essential (primary) hypertension: Secondary | ICD-10-CM | POA: Diagnosis not present

## 2017-03-20 DIAGNOSIS — Z6833 Body mass index (BMI) 33.0-33.9, adult: Secondary | ICD-10-CM

## 2017-03-20 DIAGNOSIS — Z23 Encounter for immunization: Secondary | ICD-10-CM

## 2017-03-20 DIAGNOSIS — E669 Obesity, unspecified: Secondary | ICD-10-CM | POA: Diagnosis not present

## 2017-03-20 DIAGNOSIS — K2100 Gastro-esophageal reflux disease with esophagitis, without bleeding: Secondary | ICD-10-CM

## 2017-03-20 MED ORDER — AMLODIPINE BESYLATE 10 MG PO TABS: 10.0000 mg | ORAL_TABLET | Freq: Every day | ORAL | 1 refills | Status: DC

## 2017-03-20 MED ORDER — ATORVASTATIN CALCIUM 10 MG PO TABS: 10.0000 mg | ORAL_TABLET | Freq: Every day | ORAL | 1 refills | Status: DC

## 2017-03-20 MED ORDER — PANTOPRAZOLE SODIUM 40 MG PO TBEC: 40.0000 mg | DELAYED_RELEASE_TABLET | Freq: Every day | ORAL | 1 refills | Status: DC

## 2017-03-20 MED ORDER — LOSARTAN POTASSIUM 50 MG PO TABS: 50.0000 mg | ORAL_TABLET | Freq: Every day | ORAL | 1 refills | Status: DC

## 2017-03-20 NOTE — Patient Instructions (Signed)

## 2017-03-20 NOTE — Progress Notes (Signed)
Name: Juan Moreno   MRN: [id number]    DOB: 11-27-1950   Date:03/20/2017       Progress Note  Subjective  Chief Complaint  Chief Complaint  Patient presents with  . Hypertension  . Hyperlipidemia  . Gastroesophageal Reflux    Hypertension  This is a chronic problem. The current episode started more than 1 year ago. The problem is unchanged. The problem is controlled. Pertinent negatives include no anxiety, blurred vision, chest pain, headaches, malaise/fatigue, neck pain, orthopnea, palpitations, peripheral edema, PND, shortness of breath or sweats. There are no associated agents to hypertension. Risk factors for coronary artery disease include dyslipidemia, obesity and male gender. Past treatments include angiotensin blockers and calcium channel blockers. The current treatment provides mild improvement. There are no compliance problems.  There is no history of angina, kidney disease, CAD/MI, CVA, heart failure, left ventricular hypertrophy or PVD. There is no history of chronic renal disease, a hypertension causing med or renovascular disease.  Hyperlipidemia  This is a chronic problem. The problem is controlled. Recent lipid tests were reviewed and are normal. He has no history of chronic renal disease. Pertinent negatives include no chest pain, focal weakness, myalgias or shortness of breath. Current antihyperlipidemic treatment includes statins. The current treatment provides mild improvement of lipids. There are no compliance problems.   Gastroesophageal Reflux  He reports no abdominal pain, no belching, no chest pain, no choking, no coughing, no dysphagia, no early satiety, no heartburn, no hoarse voice, no nausea, no sore throat, no stridor, no tooth decay, no water brash or no wheezing. This is a chronic problem. The problem occurs occasionally. The symptoms are aggravated by certain foods. Pertinent negatives include no anemia, fatigue, melena, muscle weakness, orthopnea or weight  loss. He has tried a PPI for the symptoms. The treatment provided moderate relief.  Benign Prostatic Hypertrophy  This is a new problem. The problem has been waxing and waning since onset. Irritative symptoms include frequency and nocturia. Irritative symptoms do not include urgency. Obstructive symptoms include incomplete emptying and a weak stream. Obstructive symptoms do not include dribbling, an intermittent stream, a slower stream or straining. Pertinent negatives include no chills, dysuria, hematuria, hesitancy, nausea or vomiting. Past treatments include nothing. The treatment provided mild relief.    No problem-specific Assessment & Plan notes found for this encounter.   Past Medical History:  Diagnosis Date  . GERD (gastroesophageal reflux disease)   . Hyperlipidemia   . Hypertension     Past Surgical History:  Procedure Laterality Date  . COLONOSCOPY  2013   cleared for 4 yrs- Dr Vira Agar- [place]  . COLONOSCOPY WITH PROPOFOL N/A 12/26/2016   Procedure: COLONOSCOPY WITH PROPOFOL;  Surgeon: Manya Silvas, MD;  Location: [facility] ENDOSCOPY;  Service: Endoscopy;  Laterality: N/A;  . STAPEDES SURGERY      No family history on file.  Social History   Social History  . Marital status: Married    Spouse name: N/A  . Number of children: N/A  . Years of education: N/A   Occupational History  . Not on file.   Social History Main Topics  . Smoking status: Never Smoker  . Smokeless tobacco: Former Systems developer  . Alcohol use 0.0 oz/week  . Drug use: No  . Sexual activity: No   Other Topics Concern  . Not on file   Social History Narrative  . No narrative on file    Allergies  Allergen Reactions  . Penicillins Rash  Outpatient Medications Prior to Visit  Medication Sig Dispense Refill  . aspirin EC 81 MG tablet Take 81 mg by mouth daily.    Marland Kitchen loratadine (CLARITIN) 10 MG tablet Take 10 mg by mouth daily. otc    . amLODipine (NORVASC) 10 MG tablet Take 1 tablet (10 mg  total) by mouth daily. 90 tablet 1  . atorvastatin (LIPITOR) 10 MG tablet Take 1 tablet (10 mg total) by mouth daily at 6 (six) AM. 90 tablet 1  . losartan (COZAAR) 50 MG tablet Take 1 tablet (50 mg total) by mouth daily. 90 tablet 1  . pantoprazole (PROTONIX) 40 MG tablet Take 1 tablet (40 mg total) by mouth daily. 90 tablet 1   No facility-administered medications prior to visit.     Review of Systems  Constitutional: Negative for chills, fatigue, fever, malaise/fatigue and weight loss.  HENT: Negative for ear discharge, ear pain, hoarse voice and sore throat.   Eyes: Negative for blurred vision.  Respiratory: Negative for cough, sputum production, choking, shortness of breath and wheezing.   Cardiovascular: Negative for chest pain, palpitations, orthopnea, leg swelling and PND.  Gastrointestinal: Negative for abdominal pain, blood in stool, constipation, diarrhea, dysphagia, heartburn, melena, nausea and vomiting.  Genitourinary: Positive for frequency, incomplete emptying and nocturia. Negative for dysuria, hematuria, hesitancy and urgency.       Nocturia  Musculoskeletal: Negative for back pain, joint pain, myalgias, muscle weakness and neck pain.  Skin: Negative for rash.  Neurological: Negative for dizziness, tingling, sensory change, focal weakness and headaches.  Endo/Heme/Allergies: Negative for environmental allergies and polydipsia. Does not bruise/bleed easily.  Psychiatric/Behavioral: Negative for depression and suicidal ideas. The patient is not nervous/anxious and does not have insomnia.      Objective  Vitals:   03/20/17 0806  BP: 120/80  Pulse: 80  Weight: 257 lb (116.6 kg)  Height: 6' (1.829 m)    Physical Exam  Constitutional: He is oriented to person, place, and time and well-developed, well-nourished, and in no distress.  HENT:  Head: Normocephalic.  Right Ear: Tympanic membrane, external ear and ear canal normal.  Left Ear: Tympanic membrane, external  ear and ear canal normal.  Nose: Nose normal.  Mouth/Throat: Oropharynx is clear and moist.  Eyes: Pupils are equal, round, and reactive to light. Conjunctivae and EOM are normal. Right eye exhibits no discharge. Left eye exhibits no discharge. No scleral icterus.  Neck: Normal range of motion. Neck supple. No JVD present. No tracheal deviation present. No thyromegaly present.  Cardiovascular: Normal rate, regular rhythm, normal heart sounds and intact distal pulses.  Exam reveals no gallop and no friction rub.   No murmur heard. Pulmonary/Chest: Breath sounds normal. No respiratory distress. He has no wheezes. He has no rales.  Abdominal: Soft. Normal aorta and bowel sounds are normal. He exhibits no mass. There is no hepatosplenomegaly. There is no tenderness. There is no rebound, no guarding and no CVA tenderness.  Genitourinary: Rectum normal and prostate normal. Prostate is not enlarged.  Musculoskeletal: Normal range of motion. He exhibits no edema or tenderness.  Lymphadenopathy:    He has no cervical adenopathy.  Neurological: He is alert and oriented to person, place, and time. He has normal sensation, normal strength, normal reflexes and intact cranial nerves. No cranial nerve deficit.  Skin: Skin is warm. No rash noted.  Psychiatric: Mood and affect normal.  Nursing note and vitals reviewed.     Assessment & Plan  Problem List Items Addressed This Visit  Cardiovascular and Mediastinum   Essential hypertension - Primary   Relevant Medications   amLODipine (NORVASC) 10 MG tablet   losartan (COZAAR) 50 MG tablet   atorvastatin (LIPITOR) 10 MG tablet   Other Relevant Orders   Renal function panel     Digestive   Gastroesophageal reflux disease with esophagitis   Relevant Medications   pantoprazole (PROTONIX) 40 MG tablet     Other   Hyperlipidemia   Relevant Medications   amLODipine (NORVASC) 10 MG tablet   losartan (COZAAR) 50 MG tablet   atorvastatin (LIPITOR)  10 MG tablet   Obesity    Other Visit Diagnoses    Nocturia       >2x/night   Relevant Orders   PSA   Need for 23-polyvalent pneumococcal polysaccharide vaccine          Meds ordered this encounter  Medications  . amLODipine (NORVASC) 10 MG tablet    Sig: Take 1 tablet (10 mg total) by mouth daily.    Dispense:  90 tablet    Refill:  1  . losartan (COZAAR) 50 MG tablet    Sig: Take 1 tablet (50 mg total) by mouth daily.    Dispense:  90 tablet    Refill:  1  . pantoprazole (PROTONIX) 40 MG tablet    Sig: Take 1 tablet (40 mg total) by mouth daily.    Dispense:  90 tablet    Refill:  1  . atorvastatin (LIPITOR) 10 MG tablet    Sig: Take 1 tablet (10 mg total) by mouth daily at 6 (six) AM.    Dispense:  90 tablet    Refill:  1      Dr. Dorota Heinrichs [facility]  03/20/17

## 2017-03-21 LAB — RENAL FUNCTION PANEL
Albumin: 4.6 g/dL (ref 3.6–4.8)
BUN/Creatinine Ratio: 11 (ref 10–24)
BUN: 14 mg/dL (ref 8–27)
CALCIUM: 9.7 mg/dL (ref 8.6–10.2)
CO2: 23 mmol/L (ref 20–29)
CREATININE: 1.31 mg/dL — AB (ref 0.76–1.27)
Chloride: 97 mmol/L (ref 96–106)
GFR calc Af Amer: 64 mL/min/{1.73_m2} (ref >59)
GFR, EST NON AFRICAN AMERICAN: 56 mL/min/{1.73_m2} — AB (ref >59)
GLUCOSE: 78 mg/dL (ref 65–99)
POTASSIUM: 4.4 mmol/L (ref 3.5–5.2)
Phosphorus: 3.2 mg/dL (ref 2.5–4.5)
Sodium: 140 mmol/L (ref 134–144)

## 2017-03-21 LAB — PSA: Prostate Specific Ag, Serum: 0.7 ng/mL (ref 0.0–4.0)

## 2017-04-11 ENCOUNTER — Ambulatory Visit (INDEPENDENT_AMBULATORY_CARE_PROVIDER_SITE_OTHER): Payer: Medicare Other | Admitting: Family Medicine

## 2017-04-11 ENCOUNTER — Encounter: Payer: Self-pay | Admitting: Family Medicine

## 2017-04-11 VITALS — BP 134/70 | HR 84 | Ht 72.0 in | Wt 257.0 lb

## 2017-04-11 DIAGNOSIS — L600 Ingrowing nail: Secondary | ICD-10-CM | POA: Diagnosis not present

## 2017-04-11 DIAGNOSIS — L03032 Cellulitis of left toe: Secondary | ICD-10-CM | POA: Diagnosis not present

## 2017-04-11 DIAGNOSIS — L603 Nail dystrophy: Secondary | ICD-10-CM

## 2017-04-11 MED ORDER — TRAMADOL HCL 50 MG PO TABS: 50.0000 mg | ORAL_TABLET | Freq: Three times a day (TID) | ORAL | 0 refills | Status: DC | PRN

## 2017-04-11 MED ORDER — CEPHALEXIN 500 MG PO CAPS: 500.0000 mg | ORAL_CAPSULE | Freq: Four times a day (QID) | ORAL | 1 refills | Status: DC

## 2017-04-11 NOTE — Progress Notes (Signed)
Name: Juan Moreno   MRN: [id number]    DOB: 30-Sep-1950   Date:04/11/2017       Progress Note  Subjective  Chief Complaint  Chief Complaint  Patient presents with  . Ingrown Toenail    L) great toe- nail is down in skin    Patient has history of ingrowing toenails. He is usually able to treat himself except for this episode.  No fever or purulence. Pain level 5/10 aggravated by walking.    No problem-specific Assessment & Plan notes found for this encounter.   Past Medical History:  Diagnosis Date  . GERD (gastroesophageal reflux disease)   . Hyperlipidemia   . Hypertension     Past Surgical History:  Procedure Laterality Date  . COLONOSCOPY  2013   cleared for 4 yrs- Dr Vira Agar- [place]  . COLONOSCOPY WITH PROPOFOL N/A 12/26/2016   Procedure: COLONOSCOPY WITH PROPOFOL;  Surgeon: Manya Silvas, MD;  Location: [facility] ENDOSCOPY;  Service: Endoscopy;  Laterality: N/A;  . STAPEDES SURGERY      No family history on file.  Social History   Social History  . Marital status: Married    Spouse name: N/A  . Number of children: N/A  . Years of education: N/A   Occupational History  . Not on file.   Social History Main Topics  . Smoking status: Never Smoker  . Smokeless tobacco: Former Systems developer  . Alcohol use 0.0 oz/week  . Drug use: No  . Sexual activity: No   Other Topics Concern  . Not on file   Social History Narrative  . No narrative on file    Allergies  Allergen Reactions  . Penicillins Rash    Outpatient Medications Prior to Visit  Medication Sig Dispense Refill  . amLODipine (NORVASC) 10 MG tablet Take 1 tablet (10 mg total) by mouth daily. 90 tablet 1  . aspirin EC 81 MG tablet Take 81 mg by mouth daily.    Marland Kitchen atorvastatin (LIPITOR) 10 MG tablet Take 1 tablet (10 mg total) by mouth daily at 6 (six) AM. 90 tablet 1  . loratadine (CLARITIN) 10 MG tablet Take 10 mg by mouth daily. otc    . losartan (COZAAR) 50 MG tablet Take 1 tablet (50 mg  total) by mouth daily. 90 tablet 1  . pantoprazole (PROTONIX) 40 MG tablet Take 1 tablet (40 mg total) by mouth daily. 90 tablet 1   No facility-administered medications prior to visit.     Review of Systems  Constitutional: Negative for chills, fever, malaise/fatigue and weight loss.  HENT: Negative for ear discharge, ear pain and sore throat.   Eyes: Negative for blurred vision.  Respiratory: Negative for cough, sputum production, shortness of breath and wheezing.   Cardiovascular: Negative for chest pain, palpitations and leg swelling.  Gastrointestinal: Negative for abdominal pain, blood in stool, constipation, diarrhea, heartburn, melena and nausea.  Genitourinary: Negative for dysuria, frequency, hematuria and urgency.  Musculoskeletal: Negative for back pain, joint pain, myalgias and neck pain.  Skin: Negative for rash.  Neurological: Negative for dizziness, tingling, sensory change, focal weakness and headaches.  Endo/Heme/Allergies: Negative for environmental allergies and polydipsia. Does not bruise/bleed easily.  Psychiatric/Behavioral: Negative for depression and suicidal ideas. The patient is not nervous/anxious and does not have insomnia.      Objective  Vitals:   04/11/17 1045  BP: 134/70  Pulse: 84  Weight: 257 lb (116.6 kg)  Height: 6' (1.829 m)    Physical Exam  Cardiovascular:  Normal heart sounds.   No murmur heard. Pulmonary/Chest: He has no wheezes. He has no rales.  Abdominal: Soft.  Skin: There is erythema.  Dystrophic/thickened nail / with medial erythema.  Nursing note and vitals reviewed.     Assessment & Plan  Problem List Items Addressed This Visit    None    Visit Diagnoses    Ingrown nail of great toe of left foot    -  Primary   cleaned betadine/ aneth 2 % xylocaine plain/ ecision  of medial aspect/ dressing   Relevant Medications   traMADol (ULTRAM) 50 MG tablet   Dystrophic nail       Cellulitis of toe of left foot        Relevant Medications   traMADol (ULTRAM) 50 MG tablet      Meds ordered this encounter  Medications  . cephALEXin (KEFLEX) 500 MG capsule    Sig: Take 1 capsule (500 mg total) by mouth 4 (four) times daily.    Dispense:  28 capsule    Refill:  1  . traMADol (ULTRAM) 50 MG tablet    Sig: Take 1 tablet (50 mg total) by mouth every 8 (eight) hours as needed.    Dispense:  30 tablet    Refill:  0      Dr. Zackeriah Kissler [facility]  04/11/17

## 2017-05-16 DIAGNOSIS — I10 Essential (primary) hypertension: Secondary | ICD-10-CM | POA: Diagnosis not present

## 2017-05-16 DIAGNOSIS — E782 Mixed hyperlipidemia: Secondary | ICD-10-CM | POA: Diagnosis not present

## 2017-05-17 DIAGNOSIS — Z23 Encounter for immunization: Secondary | ICD-10-CM | POA: Diagnosis not present

## 2017-06-04 DIAGNOSIS — H35711 Central serous chorioretinopathy, right eye: Secondary | ICD-10-CM | POA: Diagnosis not present

## 2017-06-18 DIAGNOSIS — H35711 Central serous chorioretinopathy, right eye: Secondary | ICD-10-CM | POA: Diagnosis not present

## 2017-07-29 DIAGNOSIS — H35051 Retinal neovascularization, unspecified, right eye: Secondary | ICD-10-CM | POA: Diagnosis not present

## 2017-08-09 ENCOUNTER — Ambulatory Visit: Payer: Medicare Other | Admitting: Family Medicine

## 2017-08-09 ENCOUNTER — Ambulatory Visit
Admission: EM | Admit: 2017-08-09 | Discharge: 2017-08-09 | Disposition: A | Discharge status: Home / Self Care | Payer: Medicare Other | Attending: Family Medicine | Admitting: Family Medicine

## 2017-08-09 ENCOUNTER — Other Ambulatory Visit: Payer: Self-pay

## 2017-08-09 ENCOUNTER — Encounter: Payer: Self-pay | Admitting: Emergency Medicine

## 2017-08-09 DIAGNOSIS — R221 Localized swelling, mass and lump, neck: Secondary | ICD-10-CM | POA: Diagnosis not present

## 2017-08-09 DIAGNOSIS — L723 Sebaceous cyst: Secondary | ICD-10-CM

## 2017-08-09 NOTE — ED Triage Notes (Signed)
Patient c/o abscess on his neck that has been there for years.  Patient states that last night it started to become tender.

## 2017-08-09 NOTE — Discharge Instructions (Signed)
Remove the packing in 24 hours.  Then routine care and keep it covered.  Call with concerns.  Take care  Dr. Lacinda Axon

## 2017-08-09 NOTE — ED Provider Notes (Signed)
[facility]    CSN: [id number] Arrival date & time: 08/09/17  1425  History   Chief Complaint Chief Complaint  Patient presents with  . Abscess   HPI  67 year old male presents with a lump of his posterior neck.  Patient states that he has had this area for many years.  Last night, it began to become tender and "inflamed".  Mild redness.  No drainage.  Given the tenderness and "inflammation" patient decided to come in for evaluation.  He is concerned that this is a developing abscess.  No fevers or chills.  No known exacerbating relieving factors.  No other associated symptoms.  No other complaints at this time.  Past Medical History:  Diagnosis Date  . GERD (gastroesophageal reflux disease)   . Hyperlipidemia   . Hypertension     Patient Active Problem List   Diagnosis Date Noted  . Colon cancer screening 09/17/2016  . Obesity 04/11/2015  . Essential hypertension 03/21/2015  . Hyperlipidemia 03/21/2015  . Gastroesophageal reflux disease with esophagitis 03/21/2015    Past Surgical History:  Procedure Laterality Date  . COLONOSCOPY  2013   cleared for 4 yrs- Dr Vira Agar- Divert  . COLONOSCOPY WITH PROPOFOL N/A 12/26/2016   Procedure: COLONOSCOPY WITH PROPOFOL;  Surgeon: Manya Silvas, MD;  Location: [facility] ENDOSCOPY;  Service: Endoscopy;  Laterality: N/A;  . STAPEDES SURGERY     Home Medications    Prior to Admission medications   Medication Sig Start Date End Date Taking? Authorizing Provider  amLODipine (NORVASC) 10 MG tablet Take 1 tablet (10 mg total) by mouth daily. 03/20/17  Yes Juline Patch, MD  atorvastatin (LIPITOR) 10 MG tablet Take 1 tablet (10 mg total) by mouth daily at 6 (six) AM. 03/20/17  Yes Juline Patch, MD  loratadine (CLARITIN) 10 MG tablet Take 10 mg by mouth daily. otc   Yes [provider]  losartan (COZAAR) 50 MG tablet Take 1 tablet (50 mg total) by mouth daily. 03/20/17  Yes Juline Patch, MD  pantoprazole  (PROTONIX) 40 MG tablet Take 1 tablet (40 mg total) by mouth daily. 03/20/17  Yes Juline Patch, MD    Family History Family History  Problem Relation Age of Onset  . Hypertension Mother   . Hypertension Father     Social History Social History   Tobacco Use  . Smoking status: Never Smoker  . Smokeless tobacco: Former Network engineer Use Topics  . Alcohol use: Yes    Alcohol/week: 0.0 oz  . Drug use: No     Allergies   Penicillins   Review of Systems Review of Systems  Constitutional: Negative.   Skin:       Lump, concern for abscess. No drainage. Mild redness.   Physical Exam Triage Vital Signs ED Triage Vitals  Enc Vitals Group     BP 08/09/17 1437 (!) 159/92     Pulse Rate 08/09/17 1437 (!) 106     Resp 08/09/17 1437 16     Temp 08/09/17 1437 97.9 F (36.6 C)     Temp Source 08/09/17 1437 Oral     SpO2 08/09/17 1437 98 %     Weight 08/09/17 1433 250 lb (113.4 kg)     Height 08/09/17 1433 6' (1.829 m)     Head Circumference --      Peak Flow --      Pain Score 08/09/17 1433 2     Pain Loc --  Pain Edu? --      Excl. in [place]? --    Updated Vital Signs BP (!) 159/92 (BP Location: Right Arm)   Pulse (!) 106   Temp 97.9 F (36.6 C) (Oral)   Resp 16   Ht 6' (1.829 m)   Wt 250 lb (113.4 kg)   SpO2 98%   BMI 33.91 kg/m   Physical Exam  Constitutional: He is oriented to person, place, and time. He appears well-developed. No distress.  Pulmonary/Chest: Effort normal. No respiratory distress.  Neurological: He is alert and oriented to person, place, and time.  Skin:  Posterior neck - 3 cm x 2.5 cm firm raised nodule noted.  No drainage.  Mild erythema.  Slightly tender.  Psychiatric: He has a normal mood and affect. His behavior is normal.  Nursing note and vitals reviewed.  UC Treatments / Results  Labs (all labs ordered are listed, but only abnormal results are displayed) Labs Reviewed - No data to display  EKG  EKG Interpretation None        Radiology No results found.  Procedures Incision and Drainage Date/Time: 08/09/2017 4:34 PM Performed by: Coral Spikes, DO Authorized by: Coral Spikes, DO   Consent:    Consent obtained:  Verbal   Consent given by:  Patient Location:    Type:  Cyst   Size:  3 cm x 2.5 cm   Location:  Neck   Neck location:  R posterior Pre-procedure details:    Skin preparation:  Betadine Anesthesia (see MAR for exact dosages):    Anesthesia method:  Local infiltration   Local anesthetic:  Lidocaine 1% WITH epi Procedure type:    Complexity:  Simple Procedure details:    Needle aspiration: no     Incision types:  Stab incision   Scalpel blade:  11   Drainage characteristics: Blood and cyst contents were removed along with cyst sac.   Drainage amount:  Moderate   Wound treatment:  Wound left open   Packing materials:  1/2 in iodoform gauze Post-procedure details:    Patient tolerance of procedure:  Tolerated well, no immediate complications   (including critical care time)  Medications Ordered in UC Medications - No data to display   Initial Impression / Assessment and Plan / UC Course  I have reviewed the triage vital signs and the nursing notes.  Pertinent labs & imaging results that were available during my care of the patient were reviewed by me and considered in my medical decision making (see chart for details).    67 year old male presents with a sebaceous cyst.  Given reports of tenderness and mild erythema, incision and drainage was performed with removal of cyst contents and sac.  Wound was left open and packed with iodoform gauze.  Patient can remove packing in 24 hours and then proceed with routine wound care.  Final Clinical Impressions(s) / UC Diagnoses   Final diagnoses:  Sebaceous cyst    ED Discharge Orders    None       Controlled Substance Prescriptions [place] Controlled Substance Registry consulted? Not Applicable   Coral Spikes, DO 08/09/17  1636

## 2017-08-12 ENCOUNTER — Telehealth: Payer: Self-pay | Admitting: Emergency Medicine

## 2017-08-12 NOTE — Telephone Encounter (Signed)
Called to follow up with patient after recent visit. Left message to call with any questions or concerns.

## 2017-09-03 DIAGNOSIS — H35051 Retinal neovascularization, unspecified, right eye: Secondary | ICD-10-CM | POA: Diagnosis not present

## 2017-09-23 ENCOUNTER — Encounter: Payer: Self-pay | Admitting: Family Medicine

## 2017-09-23 ENCOUNTER — Ambulatory Visit (INDEPENDENT_AMBULATORY_CARE_PROVIDER_SITE_OTHER): Payer: Medicare Other | Admitting: Family Medicine

## 2017-09-23 VITALS — BP 124/82 | HR 92 | Resp 16 | Ht 72.0 in | Wt 258.0 lb

## 2017-09-23 DIAGNOSIS — R351 Nocturia: Secondary | ICD-10-CM | POA: Diagnosis not present

## 2017-09-23 DIAGNOSIS — K2100 Gastro-esophageal reflux disease with esophagitis, without bleeding: Secondary | ICD-10-CM

## 2017-09-23 DIAGNOSIS — E782 Mixed hyperlipidemia: Secondary | ICD-10-CM | POA: Diagnosis not present

## 2017-09-23 DIAGNOSIS — I10 Essential (primary) hypertension: Secondary | ICD-10-CM

## 2017-09-23 MED ORDER — LOSARTAN POTASSIUM 50 MG PO TABS: 50.0000 mg | ORAL_TABLET | Freq: Every day | ORAL | 1 refills | Status: DC

## 2017-09-23 MED ORDER — ATORVASTATIN CALCIUM 10 MG PO TABS: 10.0000 mg | ORAL_TABLET | Freq: Every day | ORAL | 1 refills | Status: DC

## 2017-09-23 MED ORDER — AMLODIPINE BESYLATE 10 MG PO TABS: 10.0000 mg | ORAL_TABLET | Freq: Every day | ORAL | 1 refills | Status: DC

## 2017-09-23 MED ORDER — PANTOPRAZOLE SODIUM 40 MG PO TBEC: 40.0000 mg | DELAYED_RELEASE_TABLET | Freq: Every day | ORAL | 1 refills | Status: DC

## 2017-09-23 NOTE — Progress Notes (Signed)
Name: Job Holtsclaw   MRN: [id number]    DOB: 1951/04/05   Date:09/23/2017       Progress Note  Subjective  Chief Complaint  Chief Complaint  Patient presents with  . Hypertension    needs all refills   . Hyperlipidemia  . Gastroesophageal Reflux    Hypertension  This is a chronic problem. The current episode started more than 1 year ago. The problem is unchanged. The problem is controlled. Pertinent negatives include no anxiety, blurred vision, chest pain, headaches, malaise/fatigue, neck pain, orthopnea, palpitations, peripheral edema, PND, shortness of breath or sweats. There are no associated agents to hypertension. There are no known risk factors for coronary artery disease. Past treatments include calcium channel blockers and angiotensin blockers. The current treatment provides moderate improvement. There are no compliance problems.  There is no history of angina, kidney disease, CAD/MI, CVA, heart failure, left ventricular hypertrophy, PVD or retinopathy. There is no history of chronic renal disease, a hypertension causing med or renovascular disease.  Hyperlipidemia  This is a chronic problem. The problem is controlled. Recent lipid tests were reviewed and are normal. He has no history of chronic renal disease, diabetes, hypothyroidism, liver disease, obesity or nephrotic syndrome. There are no known factors aggravating his hyperlipidemia. Pertinent negatives include no chest pain, focal sensory loss, focal weakness, leg pain, myalgias or shortness of breath. Current antihyperlipidemic treatment includes statins. The current treatment provides moderate improvement of lipids. There are no compliance problems.  There are no known risk factors for coronary artery disease.  Gastroesophageal Reflux  He reports no abdominal pain, no belching, no chest pain, no choking, no coughing, no dysphagia, no early satiety, no globus sensation, no heartburn, no hoarse voice, no nausea, no sore  throat, no stridor or no wheezing. This is a chronic problem. The current episode started more than 1 year ago. The problem has been waxing and waning. The symptoms are aggravated by certain foods. Pertinent negatives include no anemia, fatigue, melena, muscle weakness, orthopnea or weight loss. There are no known risk factors. He has tried a PPI for the symptoms. The treatment provided moderate relief. Past procedures do not include an abdominal ultrasound, an EGD, esophageal manometry, esophageal pH monitoring, H. pylori antibody titer or a UGI.    No problem-specific Assessment & Plan notes found for this encounter.   Past Medical History:  Diagnosis Date  . GERD (gastroesophageal reflux disease)   . Hyperlipidemia   . Hypertension     Past Surgical History:  Procedure Laterality Date  . COLONOSCOPY  2013   cleared for 4 yrs- Dr Vira Agar- Divert  . COLONOSCOPY WITH PROPOFOL N/A 12/26/2016   Procedure: COLONOSCOPY WITH PROPOFOL;  Surgeon: Manya Silvas, MD;  Location: [facility] ENDOSCOPY;  Service: Endoscopy;  Laterality: N/A;  . STAPEDES SURGERY      Family History  Problem Relation Age of Onset  . Hypertension Mother   . Hypertension Father     Social History   Socioeconomic History  . Marital status: Married    Spouse name: Not on file  . Number of children: Not on file  . Years of education: Not on file  . Highest education level: Not on file  Social Needs  . Financial resource strain: Not on file  . Food insecurity - worry: Not on file  . Food insecurity - inability: Not on file  . Transportation needs - medical: Not on file  . Transportation needs - non-medical: Not on file  Occupational  History  . Not on file  Tobacco Use  . Smoking status: Never Smoker  . Smokeless tobacco: Former Network engineer and Sexual Activity  . Alcohol use: Yes    Alcohol/week: 0.0 oz  . Drug use: No  . Sexual activity: No  Other Topics Concern  . Not on file  Social History  Narrative  . Not on file    Allergies  Allergen Reactions  . Penicillins Rash    Outpatient Medications Prior to Visit  Medication Sig Dispense Refill  . loratadine (CLARITIN) 10 MG tablet Take 10 mg by mouth daily. otc    . amLODipine (NORVASC) 10 MG tablet Take 1 tablet (10 mg total) by mouth daily. 90 tablet 1  . atorvastatin (LIPITOR) 10 MG tablet Take 1 tablet (10 mg total) by mouth daily at 6 (six) AM. 90 tablet 1  . losartan (COZAAR) 50 MG tablet Take 1 tablet (50 mg total) by mouth daily. 90 tablet 1  . pantoprazole (PROTONIX) 40 MG tablet Take 1 tablet (40 mg total) by mouth daily. 90 tablet 1   No facility-administered medications prior to visit.     Review of Systems  Constitutional: Negative for chills, fatigue, fever, malaise/fatigue and weight loss.  HENT: Negative for ear discharge, ear pain, hoarse voice and sore throat.   Eyes: Negative for blurred vision.  Respiratory: Negative for cough, sputum production, choking, shortness of breath and wheezing.   Cardiovascular: Negative for chest pain, palpitations, orthopnea, leg swelling and PND.  Gastrointestinal: Negative for abdominal pain, blood in stool, constipation, diarrhea, dysphagia, heartburn, melena and nausea.  Genitourinary: Negative for dysuria, frequency, hematuria and urgency.  Musculoskeletal: Negative for back pain, joint pain, myalgias, muscle weakness and neck pain.  Skin: Negative for rash.  Neurological: Negative for dizziness, tingling, sensory change, focal weakness and headaches.  Endo/Heme/Allergies: Negative for environmental allergies and polydipsia. Does not bruise/bleed easily.  Psychiatric/Behavioral: Negative for depression and suicidal ideas. The patient is not nervous/anxious and does not have insomnia.      Objective  Vitals:   09/23/17 0817  BP: 124/82  Pulse: 92  Resp: 16  SpO2: 96%  Weight: 258 lb (117 kg)  Height: 6' (1.829 m)    Physical Exam  Constitutional: He is  oriented to person, place, and time and well-developed, well-nourished, and in no distress.  HENT:  Head: Normocephalic.  Right Ear: Tympanic membrane and external ear normal.  Left Ear: Tympanic membrane and external ear normal.  Nose: Nose normal.  Mouth/Throat: Oropharynx is clear and moist.  Eyes: Conjunctivae and EOM are normal. Pupils are equal, round, and reactive to light. Right eye exhibits no discharge. Left eye exhibits no discharge. No scleral icterus.  Neck: Normal range of motion. Neck supple. No JVD present. No tracheal deviation present. No thyromegaly present.  Cardiovascular: Normal rate, regular rhythm, S1 normal, S2 normal, normal heart sounds and intact distal pulses. Exam reveals no gallop, no S3, no S4 and no friction rub.  No murmur heard. Pulmonary/Chest: Breath sounds normal. No respiratory distress. He has no wheezes. He has no rales.  Abdominal: Soft. Normal aorta and bowel sounds are normal. He exhibits no mass. There is no hepatosplenomegaly. There is no tenderness. There is no rebound, no guarding and no CVA tenderness.  Genitourinary: Rectum normal and prostate normal.  Musculoskeletal: Normal range of motion. He exhibits no edema or tenderness.  Lymphadenopathy:    He has no cervical adenopathy.  Neurological: He is alert and oriented to person,  place, and time. He has normal sensation, normal strength, normal reflexes and intact cranial nerves. No cranial nerve deficit.  Skin: Skin is warm. No rash noted.  Psychiatric: Mood and affect normal.  Nursing note and vitals reviewed.     Assessment & Plan  Problem List Items Addressed This Visit      Cardiovascular and Mediastinum   Essential hypertension - Primary   Relevant Medications   atorvastatin (LIPITOR) 10 MG tablet   losartan (COZAAR) 50 MG tablet   amLODipine (NORVASC) 10 MG tablet   Other Relevant Orders   Renal Function Panel     Digestive   Gastroesophageal reflux disease with  esophagitis   Relevant Medications   pantoprazole (PROTONIX) 40 MG tablet     Other   Hyperlipidemia   Relevant Medications   atorvastatin (LIPITOR) 10 MG tablet   losartan (COZAAR) 50 MG tablet   amLODipine (NORVASC) 10 MG tablet    Other Visit Diagnoses    Nocturia more than twice per night       new   Relevant Orders   PSA      Meds ordered this encounter  Medications  . atorvastatin (LIPITOR) 10 MG tablet    Sig: Take 1 tablet (10 mg total) by mouth daily at 6 (six) AM.    Dispense:  90 tablet    Refill:  1  . pantoprazole (PROTONIX) 40 MG tablet    Sig: Take 1 tablet (40 mg total) by mouth daily.    Dispense:  90 tablet    Refill:  1  . losartan (COZAAR) 50 MG tablet    Sig: Take 1 tablet (50 mg total) by mouth daily.    Dispense:  90 tablet    Refill:  1  . amLODipine (NORVASC) 10 MG tablet    Sig: Take 1 tablet (10 mg total) by mouth daily.    Dispense:  90 tablet    Refill:  1      Dr. Otilio Miu [facility]  09/23/17

## 2017-09-24 LAB — RENAL FUNCTION PANEL
Albumin: 4.6 g/dL (ref 3.6–4.8)
BUN / CREAT RATIO: 10 (ref 10–24)
BUN: 14 mg/dL (ref 8–27)
CALCIUM: 9.6 mg/dL (ref 8.6–10.2)
CHLORIDE: 98 mmol/L (ref 96–106)
CO2: 26 mmol/L (ref 20–29)
CREATININE: 1.38 mg/dL — AB (ref 0.76–1.27)
GFR calc Af Amer: 60 mL/min/{1.73_m2} (ref >59)
GFR calc non Af Amer: 52 mL/min/{1.73_m2} — ABNORMAL LOW (ref >59)
Glucose: 107 mg/dL — ABNORMAL HIGH (ref 65–99)
PHOSPHORUS: 3.6 mg/dL (ref 2.5–4.5)
Potassium: 4.5 mmol/L (ref 3.5–5.2)
Sodium: 139 mmol/L (ref 134–144)

## 2017-09-24 LAB — PSA: PROSTATE SPECIFIC AG, SERUM: 0.8 ng/mL (ref 0.0–4.0)

## 2017-10-08 DIAGNOSIS — H35051 Retinal neovascularization, unspecified, right eye: Secondary | ICD-10-CM | POA: Diagnosis not present

## 2017-11-13 DIAGNOSIS — Z859 Personal history of malignant neoplasm, unspecified: Secondary | ICD-10-CM | POA: Diagnosis not present

## 2017-11-13 DIAGNOSIS — Z872 Personal history of diseases of the skin and subcutaneous tissue: Secondary | ICD-10-CM | POA: Diagnosis not present

## 2017-11-13 DIAGNOSIS — C44311 Basal cell carcinoma of skin of nose: Secondary | ICD-10-CM | POA: Diagnosis not present

## 2017-11-13 DIAGNOSIS — L578 Other skin changes due to chronic exposure to nonionizing radiation: Secondary | ICD-10-CM | POA: Diagnosis not present

## 2017-11-13 DIAGNOSIS — L570 Actinic keratosis: Secondary | ICD-10-CM | POA: Diagnosis not present

## 2017-11-13 DIAGNOSIS — D485 Neoplasm of uncertain behavior of skin: Secondary | ICD-10-CM | POA: Diagnosis not present

## 2017-11-13 DIAGNOSIS — Z86018 Personal history of other benign neoplasm: Secondary | ICD-10-CM | POA: Diagnosis not present

## 2017-11-26 DIAGNOSIS — H35711 Central serous chorioretinopathy, right eye: Secondary | ICD-10-CM | POA: Diagnosis not present

## 2017-11-26 DIAGNOSIS — H35051 Retinal neovascularization, unspecified, right eye: Secondary | ICD-10-CM | POA: Diagnosis not present

## 2017-11-27 DIAGNOSIS — C44311 Basal cell carcinoma of skin of nose: Secondary | ICD-10-CM | POA: Diagnosis not present

## 2017-12-29 ENCOUNTER — Ambulatory Visit
Admission: EM | Admit: 2017-12-29 | Discharge: 2017-12-29 | Disposition: A | Discharge status: Home / Self Care | Payer: Medicare Other

## 2017-12-29 ENCOUNTER — Other Ambulatory Visit: Payer: Self-pay

## 2017-12-29 DIAGNOSIS — K5900 Constipation, unspecified: Secondary | ICD-10-CM

## 2017-12-29 DIAGNOSIS — R1030 Lower abdominal pain, unspecified: Secondary | ICD-10-CM

## 2017-12-29 NOTE — ED Triage Notes (Signed)
Pt with low abd pain and constipation. Took Dulcolax last night and had BM in the middle of the night. Still has low abd discomfort. Concern over diverticulitis

## 2017-12-29 NOTE — Discharge Instructions (Signed)
Drink plenty of water.  Increase fiber in your diet.  Consider using Metamucil daily.  Follow-up with your primary care physician next week

## 2017-12-29 NOTE — ED Provider Notes (Signed)
[facility]    CSN: [id number] Arrival date & time: 12/29/17  1791     History   Chief Complaint Chief Complaint  Patient presents with  . Constipation  . Abdominal Pain  . Appointment    HPI Shomari Scicchitano is a 67 y.o. male.   HPI  67 year old male presents with lower (Indicating suprapubic) pain and constipation.  No vomiting has had loose stools from the use of the Dulcolax but no bleeding or mucus seen.  He has a history of diverticulitis which has been colonoscopy proven.  He had one attack of diverticulitis approximate 5 years ago.  Did not require hospitalization.  That time he has severe tenderness and pain in the left lower quadrant. This Time does not feel anything like previous diverticulitis and the pain is only mild and is much better today than it was yesterday and last night.  He is afebrile.  Pulse rate of 115 blood pressure 130/115 O2 sats are 100%.  He states that he has a tendency of increasing his pulse rate and blood pressure due to whitecoat syndrome.  States that he had a fairly good BM last night in the middle the night and small amounts today.  He does have a feeling of having to move his bowels more frequently than usual.  Is a widower and lives alone and has a very poor diet according to the patient.  He does not adhere to a high-fiber diet.        Past Medical History:  Diagnosis Date  . GERD (gastroesophageal reflux disease)   . Hyperlipidemia   . Hypertension     Patient Active Problem List   Diagnosis Date Noted  . Colon cancer screening 09/17/2016  . Obesity 04/11/2015  . Essential hypertension 03/21/2015  . Hyperlipidemia 03/21/2015  . Gastroesophageal reflux disease with esophagitis 03/21/2015    Past Surgical History:  Procedure Laterality Date  . COLONOSCOPY  2013   cleared for 4 yrs- Dr Vira Agar- Divert  . COLONOSCOPY WITH PROPOFOL N/A 12/26/2016   Procedure: COLONOSCOPY WITH PROPOFOL;  Surgeon: Manya Silvas, MD;  Location: [facility] ENDOSCOPY;  Service: Endoscopy;  Laterality: N/A;  . STAPEDES SURGERY         Home Medications    Prior to Admission medications   Medication Sig Start Date End Date Taking? Authorizing Provider  amLODipine (NORVASC) 10 MG tablet Take 1 tablet (10 mg total) by mouth daily. 09/23/17   Juline Patch, MD  atorvastatin (LIPITOR) 10 MG tablet Take 1 tablet (10 mg total) by mouth daily at 6 (six) AM. 09/23/17   Juline Patch, MD  loratadine (CLARITIN) 10 MG tablet Take 10 mg by mouth daily. otc    [provider]  losartan (COZAAR) 50 MG tablet Take 1 tablet (50 mg total) by mouth daily. 09/23/17   Juline Patch, MD  pantoprazole (PROTONIX) 40 MG tablet Take 1 tablet (40 mg total) by mouth daily. 09/23/17   Juline Patch, MD    Family History Family History  Problem Relation Age of Onset  . Hypertension Mother   . Hypertension Father     Social History Social History   Tobacco Use  . Smoking status: Never Smoker  . Smokeless tobacco: Former Network engineer Use Topics  . Alcohol use: Yes    Alcohol/week: 0.0 oz    Comment: rare  . Drug use: No     Allergies   Penicillins   Review of Systems  Review of Systems  Constitutional: Positive for activity change. Negative for appetite change, chills, fatigue and fever.  Gastrointestinal: Positive for abdominal pain and constipation. Negative for abdominal distention, blood in stool, nausea, rectal pain and vomiting.  All other systems reviewed and are negative.    Physical Exam Triage Vital Signs ED Triage Vitals  Enc Vitals Group     BP 12/29/17 0936 (!) 138/115     Pulse Rate 12/29/17 0936 (!) 115     Resp 12/29/17 0936 18     Temp 12/29/17 0936 98.4 F (36.9 C)     Temp Source 12/29/17 0936 Oral     SpO2 12/29/17 0936 100 %     Weight 12/29/17 0938 245 lb (111.1 kg)     Height 12/29/17 0938 5\' 11"  (1.803 m)     Head Circumference --      Peak Flow --      Pain Score 12/29/17  0938 3     Pain Loc --      Pain Edu? --      Excl. in [place]? --    No data found.  Updated Vital Signs BP (!) 138/115 (BP Location: Left Arm)   Pulse (!) 115   Temp 98.4 F (36.9 C) (Oral)   Resp 18   Ht 5\' 11"  (1.803 m)   Wt 245 lb (111.1 kg)   SpO2 100%   BMI 34.17 kg/m   Visual Acuity Right Eye Distance:   Left Eye Distance:   Bilateral Distance:    Right Eye Near:   Left Eye Near:    Bilateral Near:     Physical Exam  Constitutional: He is oriented to person, place, and time. He appears well-developed and well-nourished.  Non-toxic appearance. He does not appear ill. No distress.  HENT:  Head: Normocephalic.  Mouth/Throat: Oropharynx is clear and moist. No oropharyngeal exudate.  Eyes: Pupils are equal, round, and reactive to light.  Pulmonary/Chest: Effort normal and breath sounds normal.  Abdominal: Bowel sounds are normal. He exhibits no distension and no mass. There is no tenderness. There is no rigidity, no rebound and no guarding.  Neurological: He is alert and oriented to person, place, and time.  Skin: Skin is warm and dry.  Psychiatric: He has a normal mood and affect. His behavior is normal.  Nursing note and vitals reviewed.    UC Treatments / Results  Labs (all labs ordered are listed, but only abnormal results are displayed) Labs Reviewed - No data to display  EKG None  Radiology No results found.  Procedures Procedures (including critical care time)  Medications Ordered in UC Medications - No data to display  Initial Impression / Assessment and Plan / UC Course  I have reviewed the triage vital signs and the nursing notes.  Pertinent labs & imaging results that were available during my care of the patient were reviewed by me and considered in my medical decision making (see chart for details).     Plan: 1. Test/x-ray results and diagnosis reviewed with patient 2. rx as per orders; risks, benefits, potential side effects reviewed  with patient 3. Recommend supportive treatment with increased fluids and fiber in diet.  Guidelines were provided to the patient.  Recommended the use of the Metamucil to supplement his fiber since he is very poor in a good diet by his own admission.  Given parameters for returning to our clinic or going to the emergency room for increasing pain fever or chills secondary  to his diverticular disease that has had in the past.  decided against  radiation today because of his benign exam.  Also recommended he follow-up with his primary care physician next week. 4. F/u prn if symptoms worsen or don't improve  Final Clinical Impressions(s) / UC Diagnoses   Final diagnoses:  Constipation, unspecified constipation type     Discharge Instructions     Drink plenty of water.  Increase fiber in your diet.  Consider using Metamucil daily.  Follow-up with your primary care physician next week    ED Prescriptions    None     Controlled Substance Prescriptions [place] Controlled Substance Registry consulted? Not Applicable   Vivian, [place], PA-C 12/29/17 1018

## 2018-01-07 DIAGNOSIS — H35711 Central serous chorioretinopathy, right eye: Secondary | ICD-10-CM | POA: Diagnosis not present

## 2018-02-12 ENCOUNTER — Other Ambulatory Visit: Payer: Self-pay

## 2018-02-12 ENCOUNTER — Emergency Department: Payer: Medicare Other

## 2018-02-12 ENCOUNTER — Observation Stay
Admission: EM | Admit: 2018-02-12 | Discharge: 2018-02-13 | Disposition: A | Discharge status: Home / Self Care | Payer: Medicare Other | Attending: Internal Medicine | Admitting: Internal Medicine

## 2018-02-12 ENCOUNTER — Observation Stay
Admit: 2018-02-12 | Discharge: 2018-02-12 | Disposition: A | Discharge status: Home / Self Care | Payer: Medicare Other | Attending: Internal Medicine | Admitting: Internal Medicine

## 2018-02-12 ENCOUNTER — Observation Stay: Payer: Medicare Other

## 2018-02-12 DIAGNOSIS — I739 Peripheral vascular disease, unspecified: Secondary | ICD-10-CM | POA: Insufficient documentation

## 2018-02-12 DIAGNOSIS — E782 Mixed hyperlipidemia: Secondary | ICD-10-CM | POA: Diagnosis not present

## 2018-02-12 DIAGNOSIS — K219 Gastro-esophageal reflux disease without esophagitis: Secondary | ICD-10-CM | POA: Insufficient documentation

## 2018-02-12 DIAGNOSIS — Z8673 Personal history of transient ischemic attack (TIA), and cerebral infarction without residual deficits: Secondary | ICD-10-CM | POA: Diagnosis not present

## 2018-02-12 DIAGNOSIS — Z79899 Other long term (current) drug therapy: Secondary | ICD-10-CM | POA: Diagnosis not present

## 2018-02-12 DIAGNOSIS — I10 Essential (primary) hypertension: Secondary | ICD-10-CM | POA: Diagnosis not present

## 2018-02-12 DIAGNOSIS — G459 Transient cerebral ischemic attack, unspecified: Principal | ICD-10-CM | POA: Insufficient documentation

## 2018-02-12 DIAGNOSIS — J342 Deviated nasal septum: Secondary | ICD-10-CM | POA: Insufficient documentation

## 2018-02-12 DIAGNOSIS — Z880 Allergy status to penicillin: Secondary | ICD-10-CM | POA: Insufficient documentation

## 2018-02-12 DIAGNOSIS — I6522 Occlusion and stenosis of left carotid artery: Secondary | ICD-10-CM | POA: Diagnosis not present

## 2018-02-12 DIAGNOSIS — R42 Dizziness and giddiness: Secondary | ICD-10-CM | POA: Diagnosis present

## 2018-02-12 DIAGNOSIS — Z7982 Long term (current) use of aspirin: Secondary | ICD-10-CM | POA: Diagnosis not present

## 2018-02-12 DIAGNOSIS — R000 Tachycardia, unspecified: Secondary | ICD-10-CM | POA: Diagnosis not present

## 2018-02-12 DIAGNOSIS — R270 Ataxia, unspecified: Secondary | ICD-10-CM | POA: Insufficient documentation

## 2018-02-12 DIAGNOSIS — R55 Syncope and collapse: Secondary | ICD-10-CM | POA: Diagnosis not present

## 2018-02-12 LAB — COMPREHENSIVE METABOLIC PANEL
ALBUMIN: 4.7 g/dL (ref 3.5–5.0)
ALT: 39 U/L (ref 0–44)
AST: 39 U/L (ref 15–41)
Alkaline Phosphatase: 81 U/L (ref 38–126)
Anion gap: 9 (ref 5–15)
BUN: 16 mg/dL (ref 8–23)
CHLORIDE: 99 mmol/L (ref 98–111)
CO2: 26 mmol/L (ref 22–32)
CREATININE: 1.22 mg/dL (ref 0.61–1.24)
Calcium: 9.3 mg/dL (ref 8.9–10.3)
GFR calc Af Amer: >60 mL/min (ref >60)
GFR, EST NON AFRICAN AMERICAN: 59 mL/min — AB (ref >60)
GLUCOSE: 119 mg/dL — AB (ref 70–99)
POTASSIUM: 3.9 mmol/L (ref 3.5–5.1)
Sodium: 134 mmol/L — ABNORMAL LOW (ref 135–145)
Total Bilirubin: 1.7 mg/dL — ABNORMAL HIGH (ref 0.3–1.2)
Total Protein: 9 g/dL — ABNORMAL HIGH (ref 6.5–8.1)

## 2018-02-12 LAB — ECHOCARDIOGRAM COMPLETE
HEIGHTINCHES: 72 in
WEIGHTICAEL: 4054.4 [oz_av]

## 2018-02-12 LAB — CBC
HCT: 49.1 % (ref 40.0–52.0)
Hemoglobin: 16.6 g/dL (ref 13.0–18.0)
MCH: 28.7 pg (ref 26.0–34.0)
MCHC: 33.8 g/dL (ref 32.0–36.0)
MCV: 84.9 fL (ref 80.0–100.0)
PLATELETS: 301 10*3/uL (ref 150–440)
RBC: 5.78 MIL/uL (ref 4.40–5.90)
RDW: 15.6 % — ABNORMAL HIGH (ref 11.5–14.5)
WBC: 9.6 10*3/uL (ref 3.8–10.6)

## 2018-02-12 LAB — URINALYSIS, COMPLETE (UACMP) WITH MICROSCOPIC
BILIRUBIN URINE: NEGATIVE
Bacteria, UA: NONE SEEN
GLUCOSE, UA: NEGATIVE mg/dL
HGB URINE DIPSTICK: NEGATIVE
KETONES UR: NEGATIVE mg/dL
LEUKOCYTES UA: NEGATIVE
NITRITE: NEGATIVE
PH: 6 (ref 5.0–8.0)
Protein, ur: NEGATIVE mg/dL
SQUAMOUS EPITHELIAL / LPF: NONE SEEN (ref 0–5)
Specific Gravity, Urine: 1.004 — ABNORMAL LOW (ref 1.005–1.030)
WBC, UA: NONE SEEN WBC/hpf (ref 0–5)

## 2018-02-12 LAB — TSH: TSH: 1.943 u[IU]/mL (ref 0.350–4.500)

## 2018-02-12 LAB — TROPONIN I

## 2018-02-12 LAB — GLUCOSE, CAPILLARY: Glucose-Capillary: 121 mg/dL — ABNORMAL HIGH (ref 70–99)

## 2018-02-12 MED ORDER — ENOXAPARIN SODIUM 40 MG/0.4ML ~~LOC~~ SOLN
40.0000 mg | SUBCUTANEOUS | Status: DC
  Administered 2018-02-12: 22:00:00 40 mg via SUBCUTANEOUS
  Filled 2018-02-12: qty 0.4

## 2018-02-12 MED ORDER — LOSARTAN POTASSIUM 50 MG PO TABS
50.0000 mg | ORAL_TABLET | Freq: Every day | ORAL | Status: DC
  Administered 2018-02-13: 10:00:00 50 mg via ORAL
  Filled 2018-02-12: qty 1

## 2018-02-12 MED ORDER — ATORVASTATIN CALCIUM 20 MG PO TABS: 10.0000 mg | ORAL_TABLET | Freq: Every day | ORAL | Status: DC

## 2018-02-12 MED ORDER — ACETAMINOPHEN 325 MG PO TABS: 650.0000 mg | ORAL_TABLET | ORAL | Status: DC | PRN

## 2018-02-12 MED ORDER — LORATADINE 10 MG PO TABS
10.0000 mg | ORAL_TABLET | Freq: Every day | ORAL | Status: DC
  Administered 2018-02-13: 10:00:00 10 mg via ORAL
  Filled 2018-02-12: qty 1

## 2018-02-12 MED ORDER — PANTOPRAZOLE SODIUM 40 MG PO TBEC
40.0000 mg | DELAYED_RELEASE_TABLET | Freq: Every day | ORAL | Status: DC
  Administered 2018-02-13: 10:00:00 40 mg via ORAL
  Filled 2018-02-12: qty 1

## 2018-02-12 MED ORDER — AMLODIPINE BESYLATE 10 MG PO TABS
10.0000 mg | ORAL_TABLET | Freq: Every day | ORAL | Status: DC
  Administered 2018-02-13: 10:00:00 10 mg via ORAL
  Filled 2018-02-12: qty 1

## 2018-02-12 MED ORDER — ACETAMINOPHEN 650 MG RE SUPP: 650.0000 mg | RECTAL | Status: DC | PRN

## 2018-02-12 MED ORDER — ACETAMINOPHEN 160 MG/5ML PO SOLN
650.0000 mg | ORAL | Status: DC | PRN
  Filled 2018-02-12: qty 20.3

## 2018-02-12 MED ORDER — ASPIRIN 81 MG PO CHEW
324.0000 mg | CHEWABLE_TABLET | Freq: Once | ORAL | Status: AC
  Administered 2018-02-12: 324 mg via ORAL
  Filled 2018-02-12: qty 4

## 2018-02-12 MED ORDER — METOPROLOL TARTRATE 25 MG PO TABS
25.0000 mg | ORAL_TABLET | Freq: Two times a day (BID) | ORAL | Status: DC
  Administered 2018-02-12 – 2018-02-13 (×3): 25 mg via ORAL
  Filled 2018-02-12 (×3): qty 1

## 2018-02-12 MED ORDER — ASPIRIN EC 81 MG PO TBEC
81.0000 mg | DELAYED_RELEASE_TABLET | Freq: Every day | ORAL | Status: DC
Start: 2018-02-12 — End: 2018-02-13
  Administered 2018-02-12 – 2018-02-13 (×2): 81 mg via ORAL
  Filled 2018-02-12 (×2): qty 1

## 2018-02-12 MED ORDER — ATORVASTATIN CALCIUM 20 MG PO TABS
40.0000 mg | ORAL_TABLET | Freq: Every day | ORAL | Status: DC
  Administered 2018-02-13: 40 mg via ORAL
  Filled 2018-02-12: qty 2

## 2018-02-12 MED ORDER — STROKE: EARLY STAGES OF RECOVERY BOOK
Freq: Once | Status: AC
  Administered 2018-02-12: 11:00:00

## 2018-02-12 NOTE — Care Management Obs Status (Signed)
[place] NOTIFICATION   Patient Details  Name: Juan Moreno MRN: [id number] Date of Birth: 16-Apr-1951   Medicare Observation Status Notification Given:  Yes    Shelbie Ammons, RN 02/12/2018, 2:57 PM

## 2018-02-12 NOTE — Progress Notes (Signed)
Called from US-patient c/o "dizzy and room spinning" during carotid US.  Vitals checked-see flowsheet.  Sinus tach-rate 109-114.  A&O X 4.  No dyspnea.  Color pink/skin warm and dry.  Episode lasted approx. 2-3 minutes.    Report called to floor nurse.  Pt. Stable with no c/o at present.

## 2018-02-12 NOTE — Consult Note (Signed)
[facility] Cardiology Consultation Note  Patient ID: Juan Moreno, MRN: [id number], DOB/AGE: 1951-05-24 67 y.o. Admit date: 02/12/2018   Date of Consult: 02/12/2018 Primary Physician: Juline Patch, MD Primary Cardiologist: Nehemiah Massed  Chief Complaint:  Chief Complaint  Patient presents with  . Dizziness   Reason for Consult: Dizziness presyncope tachycardia  HPI: 67 y.o. male with known essential hypertension mixed hyperlipidemia who has had new onset of significant dizziness.  The patient woke up this morning having difficulty with unable to stand up or walk around due to severe significant dizziness.  This worsened with changing his head position.  He also was seen in the emergency room at the time with an EKG showing sinus tachycardia and prior levels which were normal.  There is been no chest pain shortness of breath or heart failure type symptoms.  Since then he had a carotid Doppler showing no evidence of significant atherosclerosis and had worsening vertigo type symptoms at that time.  Currently he is more stable.  The patient has had a CAT scan showing no evidence concerning vascular disease that is acute  Past Medical History:  Diagnosis Date  . GERD (gastroesophageal reflux disease)   . Hyperlipidemia   . Hypertension       Surgical History:  Past Surgical History:  Procedure Laterality Date  . COLONOSCOPY  2013   cleared for 4 yrs- Dr Vira Agar- Divert  . COLONOSCOPY WITH PROPOFOL N/A 12/26/2016   Procedure: COLONOSCOPY WITH PROPOFOL;  Surgeon: Manya Silvas, MD;  [facility]: [facility] END[facility]COPY;  Service: Endoscopy;  Laterality: N/A;  . STAPEDES SURGERY       Home Meds: Prior to Admission medications   Medication Sig Start Date End Date Taking? Authorizing Provider  amLODipine (NORVASC) 10 MG tablet Take 1 tablet (10 mg total) by mouth daily. 09/23/17  Yes Juline Patch, MD  atorvastatin (LIPITOR) 10 MG tablet Take 1 tablet (10 mg total) by mouth daily at 6  (six) AM. 09/23/17  Yes Juline Patch, MD  loratadine (CLARITIN) 10 MG tablet Take 10 mg by mouth daily.    Yes [provider]  losartan (COZAAR) 50 MG tablet Take 1 tablet (50 mg total) by mouth daily. 09/23/17  Yes Juline Patch, MD  pantoprazole (PROTONIX) 40 MG tablet Take 1 tablet (40 mg total) by mouth daily. 09/23/17  Yes Juline Patch, MD    Inpatient Medications:  . [START ON 02/13/2018] amLODipine  10 mg Oral Daily  . aspirin EC  81 mg Oral Daily  . [START ON 02/13/2018] atorvastatin  40 mg Oral Q0600  . enoxaparin (LOVENOX) injection  40 mg Subcutaneous Q24H  . [START ON 02/13/2018] loratadine  10 mg Oral Daily  . [START ON 02/13/2018] losartan  50 mg Oral Daily  . metoprolol tartrate  25 mg Oral BID  . [START ON 02/13/2018] pantoprazole  40 mg Oral Daily     Allergies:  Allergies  Allergen Reactions  . Penicillins Rash    Social History   Socioeconomic History  . Marital status: Widowed    Spouse name: Not on file  . Number of children: Not on file  . Years of education: Not on file  . Highest education level: Not on file  Occupational History  . Not on file  Social Needs  . Financial resource strain: Not on file  . Food insecurity:    Worry: Not on file    Inability: Not on file  . Transportation needs:  Medical: Not on file    Non-medical: Not on file  Tobacco Use  . Smoking status: Never Smoker  . Smokeless tobacco: Former Systems developer    Types: Chew  Substance and Sexual Activity  . Alcohol use: Yes    Alcohol/week: 0.0 oz    Comment: rare  . Drug use: No  . Sexual activity: Never  Lifestyle  . Physical activity:    Days per week: Not on file    Minutes per session: Not on file  . Stress: Not on file  Relationships  . Social connections:    Talks on phone: Not on file    Gets together: Not on file    Attends religious service: Not on file    Active member of club or organization: Not on file    Attends meetings of clubs or  organizations: Not on file    Relationship status: Not on file  . Intimate partner violence:    Fear of current or ex partner: Not on file    Emotionally abused: Not on file    Physically abused: Not on file    Forced sexual activity: Not on file  Other Topics Concern  . Not on file  Social History Narrative  . Not on file     Family History  Problem Relation Age of Onset  . Hypertension Mother   . Hypertension Father      Review of Systems Positive for vertigo dizziness Negative for: General:  chills, fever, night sweats or weight changes.  Cardiovascular: PND orthopnea syncope dizziness  Dermatological skin lesions rashes Respiratory: Cough congestion Urologic: Frequent urination urination at night and hematuria Abdominal: negative for nausea, vomiting, diarrhea, bright red blood per rectum, melena, or hematemesis Neurologic: negative for visual changes, and/or hearing changes  All other systems reviewed and are otherwise negative except as noted above.  Labs: Recent Labs    02/12/18 0816  TROPONINI <0.03   Lab Results  Component Value Date   WBC 9.6 02/12/2018   HGB 16.6 02/12/2018   HCT 49.1 02/12/2018   MCV 84.9 02/12/2018   PLT 301 02/12/2018    Recent Labs  Lab 02/12/18 0816  NA 134*  K 3.9  CL 99  CO2 26  BUN 16  CREATININE 1.22  CALCIUM 9.3  PROT 9.0*  BILITOT 1.7*  ALKPHOS 81  ALT 39  AST 39  GLUCOSE 119*   Lab Results  Component Value Date   CHOL 119 09/17/2016   HDL 42 09/17/2016   LDLCALC 51 09/17/2016   TRIG 129 09/17/2016   No results found for: DDIMER  Radiology/Studies:  Ct Head Wo Contrast  Result Date: 02/12/2018 CLINICAL DATA:  Dizziness EXAM: CT HEAD WITHOUT CONTRAST TECHNIQUE: Contiguous axial images were obtained from the base of the skull through the vertex without intravenous contrast. COMPARISON:  None. FINDINGS: Brain: There is mild diffuse atrophy. There is no intracranial mass, hemorrhage, extra-axial fluid  collection, or midline shift. There is mild patchy small vessel disease in the centra semiovale bilaterally. Elsewhere gray-white compartments appear unremarkable. No acute infarct evident. Vascular: There is no appreciable hyperdense vessel. There is calcification in each carotid siphon region. Skull: Bony calvarium appears intact. Sinuses/Orbits: There is mucosal thickening in several ethmoid air cells bilaterally. Other visualized paranasal sinuses are clear. There is mild leftward deviation of the anterior nasal septum. Orbits appear symmetric bilaterally. Other: Visualized mastoid air cells are clear. IMPRESSION: Atrophy with mild periventricular small vessel disease. No mass or hemorrhage. No evident  acute infarct. There are foci of arterial vascular calcification. There is mucosal thickening in several ethmoid air cells. Mild nasal septal deviation. Electronically Signed   By: Lowella Grip III M.D.   On: 02/12/2018 07:27   US Carotid Bilateral (at [facility] Only)  Result Date: 02/12/2018 CLINICAL DATA:  67 year old male with a history of TIA. Cardiovascular risk factors include hypertension, known stroke/TIA, hyperlipidemia, tobacco use EXAM: BILATERAL CAROTID DUPLEX ULTRASOUND TECHNIQUE: Pearline Cables scale imaging, color Doppler and duplex ultrasound were performed of bilateral carotid and vertebral arteries in the neck. COMPARISON:  None. FINDINGS: Criteria: Quantification of carotid stenosis is based on velocity parameters that correlate the residual internal carotid diameter with NASCET-based stenosis levels, using the diameter of the distal internal carotid lumen as the denominator for stenosis measurement. The following velocity measurements were obtained: RIGHT ICA:  Systolic 85 cm/sec, Diastolic 26 cm/sec CCA:  83 cm/sec SYSTOLIC ICA/CCA RATIO:  1.0 ECA:  85 cm/sec LEFT ICA:  Systolic 76 cm/sec, Diastolic 18 cm/sec CCA:  875 cm/sec SYSTOLIC ICA/CCA RATIO:  0.6 ECA:  108 cm/sec Right Brachial SBP:  Not acquired Left Brachial SBP: Not acquired RIGHT CAROTID ARTERY: No significant calcified disease of the right common carotid artery. Intermediate waveform maintained. Heterogeneous plaque without significant calcifications at the right carotid bifurcation. Low resistance waveform of the right ICA. No significant tortuosity. RIGHT VERTEBRAL ARTERY: Antegrade flow with low resistance waveform. LEFT CAROTID ARTERY: No significant calcified disease of the left common carotid artery. Intermediate waveform maintained. Heterogeneous plaque at the left carotid bifurcation without significant calcifications. Low resistance waveform of the left ICA. LEFT VERTEBRAL ARTERY:  Antegrade flow with low resistance waveform. IMPRESSION: Color duplex indicates minimal heterogeneous plaque, with no hemodynamically significant stenosis by duplex criteria in the extracranial cerebrovascular circulation. Signed, Dulcy Fanny. Dellia Nims, RPVI [facility] Electronically Signed   By: Corrie Mckusick D.O.   On: 02/12/2018 14:29    EKG: Sinus tachycardia  Weights: Filed Weights   02/12/18 0646 02/12/18 1034  Weight: 255 lb (115.7 kg) 253 lb 6.4 oz (114.9 kg)     Physical Exam: Blood pressure (!) 162/101, pulse (!) 114, temperature 98.7 F (37.1 C), temperature source Oral, resp. rate 18, height 6' (1.829 m), weight 253 lb 6.4 oz (114.9 kg), SpO2 98 %. Body mass index is 34.37 kg/m. General: Well developed, well nourished, in no acute distress. Head eyes ears nose throat: Normocephalic, atraumatic, sclera non-icteric, no xanthomas, nares are without discharge. No apparent thyromegaly and/or mass  Lungs: Normal respiratory effort.  no wheezes, no rales, no rhonchi.  Heart: RRR with normal S1 S2. no murmur gallop, no rub, PMI is normal size and placement, carotid upstroke normal without bruit, jugular venous pressure is normal Abdomen: Soft, non-tender, non-distended with  normoactive bowel sounds. No hepatomegaly. No rebound/guarding. No obvious abdominal masses. Abdominal aorta is normal size without bruit Extremities: No edema. no cyanosis, no clubbing, no ulcers  Peripheral : 2+ bilateral upper extremity pulses, 2+ bilateral femoral pulses, 2+ bilateral dorsal pedal pulse Neuro: Alert and oriented. No facial asymmetry. No focal deficit. Moves all extremities spontaneously. Musculoskeletal: Normal muscle tone without kyphosis Psych:  Responds to questions appropriately with a normal affect.    Assessment: 67 year old male with essential hypertension mixed hyperlipidemia with symptoms consistent with vertigo without evidence of vascular disease or cardiac disease at this time with sinus tachycardia likely secondary to current condition rather than acute primary source  Plan: 1.  Continue antihypertensive medication  management including losartan amlodipine and addition of low-dose of metoprolol for sinus tachycardia and malignant hypertension t 2.  High intensity cholesterol therapy 3.  Aspirin 4.  And follow for improvements of symptoms and further treatment of possible primary treatment of vertigo 5.  Further cardiac diagnostics necessary at this time  Signed, Corey Skains M.D. [facility] 02/12/2018, 5:13 PM

## 2018-02-12 NOTE — Progress Notes (Signed)
*  PRELIMINARY RESULTS* Echocardiogram 2D Echocardiogram has been performed.  Juan Moreno 02/12/2018, 2:59 PM

## 2018-02-12 NOTE — H&P (Signed)
[facility] [place] NAME: Juan Moreno    MR#:  [id number]  DATE OF BIRTH:  [birth year]  DATE OF ADMISSION:  02/12/2018  PRIMARY CARE PHYSICIAN: Juline Patch, MD   REQUESTING/REFERRING PHYSICIAN: Liam Graham MD  CHIEF COMPLAINT:   Chief Complaint  Patient presents with  . Dizziness    HISTORY OF PRESENT ILLNESS: Chistian Moreno  is a 67 y.o. male with a known history of essential hypertension, GERD, hyperlipidemia who woke up at 3 AM this morning and felt dizzy.  He  felt unsteady on his feet and could not walk.  Patient now back to baseline.  CT scan of the head is normal.  We are asked to admit the patient for possible TIA. PAST MEDICAL HISTORY:   Past Medical History:  Diagnosis Date  . GERD (gastroesophageal reflux disease)   . Hyperlipidemia   . Hypertension     PAST SURGICAL HISTORY:  Past Surgical History:  Procedure Laterality Date  . COLONOSCOPY  2013   cleared for 4 yrs- Dr Vira Agar- Divert  . COLONOSCOPY WITH PROPOFOL N/A 12/26/2016   Procedure: COLONOSCOPY WITH PROPOFOL;  Surgeon: Manya Silvas, MD;  [facility] ENDOSCOPY;  Service: Endoscopy;  Laterality: N/A;  . STAPEDES SURGERY      SOCIAL HISTORY:  Social History   Tobacco Use  . Smoking status: Never Smoker  . Smokeless tobacco: Former Network engineer Use Topics  . Alcohol use: Yes    Alcohol/week: 0.0 oz    Comment: rare    FAMILY HISTORY:  Family History  Problem Relation Age of Onset  . Hypertension Mother   . Hypertension Father     DRUG ALLERGIES:  Allergies  Allergen Reactions  . Penicillins Rash    REVIEW OF SYSTEMS:   CONSTITUTIONAL: No fever, fatigue or weakness.  EYES: No blurred or double vision.  EARS, NOSE, AND THROAT: No tinnitus or ear pain.  RESPIRATORY: No cough, shortness of breath, wheezing or hemoptysis.  CARDIOVASCULAR: No chest pain, orthopnea, edema.  GASTROINTESTINAL: No nausea, vomiting, diarrhea or  abdominal pain.  GENITOURINARY: No dysuria, hematuria.  ENDOCRINE: No polyuria, nocturia,  HEMATOLOGY: No anemia, easy bruising or bleeding SKIN: No rash or lesion. MUSCULOSKELETAL: No joint pain or arthritis.   NEUROLOGIC: No tingling, numbness, weakness.  PSYCHIATRY: No anxiety or depression.   MEDICATIONS AT HOME:  Prior to Admission medications   Medication Sig Start Date End Date Taking? Authorizing Provider  amLODipine (NORVASC) 10 MG tablet Take 1 tablet (10 mg total) by mouth daily. 09/23/17   Juline Patch, MD  atorvastatin (LIPITOR) 10 MG tablet Take 1 tablet (10 mg total) by mouth daily at 6 (six) AM. 09/23/17   Juline Patch, MD  loratadine (CLARITIN) 10 MG tablet Take 10 mg by mouth daily. otc    [provider]  losartan (COZAAR) 50 MG tablet Take 1 tablet (50 mg total) by mouth daily. 09/23/17   Juline Patch, MD  pantoprazole (PROTONIX) 40 MG tablet Take 1 tablet (40 mg total) by mouth daily. 09/23/17   Juline Patch, MD      PHYSICAL EXAMINATION:   VITAL SIGNS: Blood pressure 135/89, pulse 93, temperature 98.1 F (36.7 C), resp. rate 16, height 6' (1.829 m), weight 115.7 kg (255 lb), SpO2 96 %.  GENERAL:  67 y.o.-year-old patient lying in the bed with no acute distress.  EYES: Pupils equal, round, reactive to light and accommodation. No scleral icterus. Extraocular  muscles intact.  HEENT: Head atraumatic, normocephalic. Oropharynx and nasopharynx clear.  NECK:  Supple, no jugular venous distention. No thyroid enlargement, no tenderness.  LUNGS: Normal breath sounds bilaterally, no wheezing, rales,rhonchi or crepitation. No use of accessory muscles of respiration.  CARDIOVASCULAR: S1, S2 normal. No murmurs, rubs, or gallops.  ABDOMEN: Soft, nontender, nondistended. Bowel sounds present. No organomegaly or mass.  EXTREMITIES: No pedal edema, cyanosis, or clubbing.  NEUROLOGIC: Cranial nerves II through XII are intact. Muscle strength 5/5 in all  extremities. Sensation intact. Gait not checked.  PSYCHIATRIC: The patient is alert and oriented x 3.  SKIN: No obvious rash, lesion, or ulcer.   LABORATORY PANEL:   CBC Recent Labs  Lab 02/12/18 0816  WBC 9.6  HGB 16.6  HCT 49.1  PLT 301  MCV 84.9  MCH 28.7  MCHC 33.8  RDW 15.6*   ------------------------------------------------------------------------------------------------------------------  Chemistries  Recent Labs  Lab 02/12/18 0816  NA 134*  K 3.9  CL 99  CO2 26  GLUCOSE 119*  BUN 16  CREATININE 1.22  CALCIUM 9.3  AST 39  ALT 39  ALKPHOS 81  BILITOT 1.7*   ------------------------------------------------------------------------------------------------------------------ estimated creatinine clearance is 76.1 mL/min (by C-G formula based on SCr of 1.22 mg/dL). ------------------------------------------------------------------------------------------------------------------ No results for input(s): TSH, T4TOTAL, T3FREE, THYROIDAB in the last 72 hours.  Invalid input(s): FREET3   Coagulation profile No results for input(s): INR, PROTIME in the last 168 hours. ------------------------------------------------------------------------------------------------------------------- No results for input(s): DDIMER in the last 72 hours. -------------------------------------------------------------------------------------------------------------------  Cardiac Enzymes Recent Labs  Lab 02/12/18 0816  TROPONINI <0.03   ------------------------------------------------------------------------------------------------------------------ Invalid input(s): POCBNP  ---------------------------------------------------------------------------------------------------------------  Urinalysis    Component Value Date/Time   COLORURINE COLORLESS (A) 02/12/2018 0816   APPEARANCEUR CLEAR (A) 02/12/2018 0816   APPEARANCEUR CLEAR 08/26/2014 1921   LABSPEC 1.004 (L) 02/12/2018  0816   LABSPEC 1.005 08/26/2014 1921   PHURINE 6.0 02/12/2018 0816   GLUCOSEU NEGATIVE 02/12/2018 0816   GLUCOSEU NEGATIVE 08/26/2014 1921   HGBUR NEGATIVE 02/12/2018 0816   BILIRUBINUR NEGATIVE 02/12/2018 0816   BILIRUBINUR negative 03/21/2016 0843   BILIRUBINUR NEGATIVE 08/26/2014 1921   KETONESUR NEGATIVE 02/12/2018 0816   PROTEINUR NEGATIVE 02/12/2018 0816   UROBILINOGEN 0.2 03/21/2016 0843   NITRITE NEGATIVE 02/12/2018 0816   LEUKOCYTESUR NEGATIVE 02/12/2018 0816   LEUKOCYTESUR NEGATIVE 08/26/2014 1921     RADIOLOGY: Ct Head Wo Contrast  Result Date: 02/12/2018 CLINICAL DATA:  Dizziness EXAM: CT HEAD WITHOUT CONTRAST TECHNIQUE: Contiguous axial images were obtained from the base of the skull through the vertex without intravenous contrast. COMPARISON:  None. FINDINGS: Brain: There is mild diffuse atrophy. There is no intracranial mass, hemorrhage, extra-axial fluid collection, or midline shift. There is mild patchy small vessel disease in the centra semiovale bilaterally. Elsewhere gray-white compartments appear unremarkable. No acute infarct evident. Vascular: There is no appreciable hyperdense vessel. There is calcification in each carotid siphon region. Skull: Bony calvarium appears intact. Sinuses/Orbits: There is mucosal thickening in several ethmoid air cells bilaterally. Other visualized paranasal sinuses are clear. There is mild leftward deviation of the anterior nasal septum. Orbits appear symmetric bilaterally. Other: Visualized mastoid air cells are clear. IMPRESSION: Atrophy with mild periventricular small vessel disease. No mass or hemorrhage. No evident acute infarct. There are foci of arterial vascular calcification. There is mucosal thickening in several ethmoid air cells. Mild nasal septal deviation. Electronically Signed   By: Lowella Grip III M.D.   On: 02/12/2018 07:27    EKG: Orders placed or performed during  the hospital encounter of 02/12/18  . ED EKG  . ED  EKG  . EKG 12-Lead  . EKG 12-Lead    IMPRESSION AND PLAN: Patient is a 67 year old presenting presenting with dizziness and unsteady gait  1.  TIA We will  Place patient under observation overnight Cant do MRI due to metal in EAR Will obtain carotid Dopplers Echo of the heart Neurology evaluation Lipid panel in the a.m.  2.  Essential hypertension continue Norvasc and Cozaar  3.  Hyperlipidemia continue Lipitor  4.  GERD continue Protonix  5.  Lovenox for DVT prophylaxis   All the records are reviewed and case discussed with ED provider. Management plans discussed with the patient, family and they are in agreement.  CODE STATUS:    TOTAL TIME TAKING CARE OF THIS PATIENT:98minutes.    Dustin Flock M.D on 02/12/2018 at 9:46 AM  Between 7am to 6pm - Pager - [phone]  After 6pm go to [email or web address] - password [email or web address]  [employer] [facility] Office  [phone]  CC: Primary care physician; Juline Patch, MD

## 2018-02-12 NOTE — Progress Notes (Signed)
PT Cancellation Note  Patient Details Name: Juan Moreno MRN: [id number] DOB: Dec 04, 1950   Cancelled Treatment:    Reason Eval/Treat Not Completed: Medical issues which prohibited therapy.  Pt with dizzy spell and suspected TIA pending Neurology consult.  Per chart review, pt with repeat dizzy spell during carotid US.  Unable to have MRI due to metal in ear, per chart review. Will hold PT until Neuro consult complete and pt appropriate for PT eval.    Collie Siad PT, DPT 02/12/2018, 1:36 PM

## 2018-02-12 NOTE — ED Notes (Signed)
Discussed symptoms with Dr. Owens Shark which advised a code stroke not be called at this time.

## 2018-02-12 NOTE — ED Triage Notes (Addendum)
Pt states he woke up this am with co dizziness at 0300. Denies any weakness or pain, states got up at 0100 to use the restrooms and felt at baseline.  Pt denies any other deficits or symptoms.

## 2018-02-12 NOTE — ED Provider Notes (Signed)
[facility] Emergency Department Provider Note       Time seen: ----------------------------------------- 7:54 AM on 02/12/2018 -----------------------------------------   I have reviewed the triage vital signs and the nursing notes.  HISTORY   Chief Complaint Dizziness    HPI Juan Moreno is a 67 y.o. male with a history of GERD, hyperlipidemia and hypertension who presents to the ED for dizziness.  Patient states he woke up this morning and could not walk without falling.  He woke up around 3 AM with the symptoms.  He was denying any focal weakness, vertigo symptoms, nausea or other neurologic complaints.  Patient states he had a walk down the hall supporting himself by leaning against the wall.  Patient states his symptoms seem to have resolved at this time, when he woke up at 1 AM he was not having the symptoms.  Past Medical History:  Diagnosis Date  . GERD (gastroesophageal reflux disease)   . Hyperlipidemia   . Hypertension     Patient Active Problem List   Diagnosis Date Noted  . Colon cancer screening 09/17/2016  . Obesity 04/11/2015  . Essential hypertension 03/21/2015  . Hyperlipidemia 03/21/2015  . Gastroesophageal reflux disease with esophagitis 03/21/2015    Past Surgical History:  Procedure Laterality Date  . COLONOSCOPY  2013   cleared for 4 yrs- Dr Vira Agar- [place]  . COLONOSCOPY WITH PROPOFOL N/A 12/26/2016   Procedure: COLONOSCOPY WITH PROPOFOL;  Surgeon: Manya Silvas, MD;  Location: [facility] ENDOSCOPY;  Service: Endoscopy;  Laterality: N/A;  . STAPEDES SURGERY      Allergies Penicillins  Social History Social History   Tobacco Use  . Smoking status: Never Smoker  . Smokeless tobacco: Former Network engineer Use Topics  . Alcohol use: Yes    Alcohol/week: 0.0 oz    Comment: rare  . Drug use: No   Review of Systems Constitutional: Negative for fever. Cardiovascular: Negative for chest pain. Respiratory:  Negative for shortness of breath. Gastrointestinal: Negative for abdominal pain, vomiting and diarrhea. Musculoskeletal: Negative for back pain. Skin: Negative for rash. Neurological: Positive for difficulty walking  All systems negative/normal/unremarkable except as stated in the HPI  ____________________________________________   PHYSICAL EXAM:  VITAL SIGNS: ED Triage Vitals [02/12/18 0646]  Enc Vitals Group     BP (!) 159/98     Pulse Rate (!) 113     Resp 20     Temp 98.1 F (36.7 C)     Temp Source Oral     SpO2      Weight 255 lb (115.7 kg)     Height 6' (1.829 m)     Head Circumference      Peak Flow      Pain Score 0     Pain Loc      Pain Edu?      Excl. in [place]?    Constitutional: Alert and oriented. Well appearing and in no distress. Eyes: Conjunctivae are normal. Normal extraocular movements. ENT   Head: Normocephalic and atraumatic.   Nose: No congestion/rhinnorhea.   Mouth/Throat: Mucous membranes are moist.   Neck: No stridor. Cardiovascular: Normal rate, regular rhythm. No murmurs, rubs, or gallops. Respiratory: Normal respiratory effort without tachypnea nor retractions. Breath sounds are clear and equal bilaterally. No wheezes/rales/rhonchi. Gastrointestinal: Soft and nontender. Normal bowel sounds Musculoskeletal: Nontender with normal range of motion in extremities. No lower extremity tenderness nor edema. Neurologic:  Normal speech and language. No gross focal neurologic deficits are appreciated.  Negative Romberg, ambulates without any difficulty, strength, sensation, cranial nerves appear to be normal Skin:  Skin is warm, dry and intact. No rash noted. Psychiatric: Mood and affect are normal. Speech and behavior are normal.  ____________________________________________  EKG: Interpreted by me.  Sinus tachycardia with a rate of 103 bpm, normal PR interval, normal QRS size, normal QT, normal  axis  ____________________________________________  ED COURSE:  As part of my medical decision making, I reviewed the following data within the [place] History obtained from family if available, nursing notes, old chart and ekg, as well as notes from prior ED visits. Patient presented for dizziness, we will assess with labs and imaging as indicated at this time.   Procedures ____________________________________________   LABS (pertinent positives/negatives)  Labs Reviewed  CBC - Abnormal; Notable for the following components:      Result Value   RDW 15.6 (*)    All other components within normal limits  COMPREHENSIVE METABOLIC PANEL - Abnormal; Notable for the following components:   Sodium 134 (*)    Glucose, Bld 119 (*)    Total Protein 9.0 (*)    Total Bilirubin 1.7 (*)    GFR calc non Af Amer 59 (*)    All other components within normal limits  URINALYSIS, COMPLETE (UACMP) WITH MICROSCOPIC - Abnormal; Notable for the following components:   Color, Urine COLORLESS (*)    APPearance CLEAR (*)    Specific Gravity, Urine 1.004 (*)    All other components within normal limits  GLUCOSE, CAPILLARY - Abnormal; Notable for the following components:   Glucose-Capillary 121 (*)    All other components within normal limits  TROPONIN I  CBG MONITORING, ED    RADIOLOGY  CT head is unremarkable IMPRESSION: Atrophy with mild periventricular small vessel disease. No mass or hemorrhage. No evident acute infarct.  There are foci of arterial vascular calcification. There is mucosal thickening in several ethmoid air cells. Mild nasal septal deviation.  ____________________________________________  DIFFERENTIAL DIAGNOSIS   CVA, TIA, vertigo, orthostasis  FINAL ASSESSMENT AND PLAN  Ataxia  Plan: The patient had presented for ataxia which has resolved at this time. Patient's labs did not reveal any acute process. Patient's imaging revealed age-related  changes but no other acute abnormality.  This likely indicates TIA.  I will discuss with hospitalist for admission, he will receive oral aspirin.   Laurence Aly, MD   Note: This note was generated in part or whole with voice recognition software. Voice recognition is usually quite accurate but there are transcription errors that can and very often do occur. I apologize for any typographical errors that were not detected and corrected.     Earleen Newport, MD 02/12/18 [phone]

## 2018-02-12 NOTE — ED Notes (Signed)
Pt denies pain anywhere at this time. Only complaint is dizziness. Denies double or blurred vision

## 2018-02-12 NOTE — ED Notes (Signed)
Attempted report. Secretary reported RN is with another patient at this time and to call back in 10 minutes

## 2018-02-12 NOTE — Progress Notes (Signed)
SLP Cancellation Note  Patient Details Name: Juan Moreno MRN: [id number] DOB: 08-15-1950   Cancelled treatment:       Reason Eval/Treat Not Completed: SLP screened, no needs identified, will sign off(chart reviewed; consulted NSG then met w/ pt). Pt denied any difficulty swallowing and is currently on a regular diet eating his lunch meal; tolerates swallowing pills w/ water per NSG. Pt conversed at conversational level w/out deficits noted; pt and family in room denied any speech-language deficits.  No further skilled ST services indicated as pt appears at his baseline. Pt agreed. NSG to reconsult if any change in status.     Orinda Kenner, MS, CCC-SLP Zachary Lovins 02/12/2018, 2:00 PM

## 2018-02-12 NOTE — Progress Notes (Signed)
OT Cancellation Note  Patient Details Name: Juan Moreno MRN: [id number] DOB: 1950-11-07   Cancelled Treatment:    Reason Eval/Treat Not Completed: Patient at procedure or test/ unavailable. Order received, chart reviewed. Pt out of room for testing. Will re-attempt OT evaluation at later date/time as pt is available and medically appropriate.  Jeni Salles, MPH, MS, OTR/L ascom [phone] 02/12/18, 11:38 AM

## 2018-02-12 NOTE — Consult Note (Signed)
Reason for Consult:Vertigo Referring Physician: Posey Pronto  CC: Vertigo  HPI: Juan Moreno is an 67 y.o. male with a history of stapedes surgery who reports awakening at 0330 an on attempting to get out of bed having severe vertigo.  On lying back down symptoms resolved.  Tried to get up multiple times with the same result.  With no improvement in symptoms presented to the ED for further evaluation.  Seemed to be improving some today but on going to the vascular lab today again had return of vertigo with transfer that persisted for a period of time despite the fact that he was flat.  Patient with no previous similar symptoms.    Past Medical History:  Diagnosis Date  . GERD (gastroesophageal reflux disease)   . Hyperlipidemia   . Hypertension     Past Surgical History:  Procedure Laterality Date  . COLONOSCOPY  2013   cleared for 4 yrs- Dr Vira Agar- [place]  . COLONOSCOPY WITH PROPOFOL N/A 12/26/2016   Procedure: COLONOSCOPY WITH PROPOFOL;  Surgeon: Manya Silvas, MD;  Location: [facility] ENDOSCOPY;  Service: Endoscopy;  Laterality: N/A;  . STAPEDES SURGERY      Family History  Problem Relation Age of Onset  . Hypertension Mother   . Hypertension Father     Social History:  reports that he has never smoked. He has quit using smokeless tobacco. His smokeless tobacco use included chew. He reports that he drinks alcohol. He reports that he does not use drugs.  Allergies  Allergen Reactions  . Penicillins Rash    Medications:  I have reviewed the patient's current medications. Prior to Admission:  Medications Prior to Admission  Medication Sig Dispense Refill Last Dose  . amLODipine (NORVASC) 10 MG tablet Take 1 tablet (10 mg total) by mouth daily. 90 tablet 1 02/12/2018 at 0700  . atorvastatin (LIPITOR) 10 MG tablet Take 1 tablet (10 mg total) by mouth daily at 6 (six) AM. 90 tablet 1 02/12/2018 at 0700  . loratadine (CLARITIN) 10 MG tablet Take 10 mg by mouth daily.     02/12/2018 at 0700  . losartan (COZAAR) 50 MG tablet Take 1 tablet (50 mg total) by mouth daily. 90 tablet 1 02/12/2018 at 0700  . pantoprazole (PROTONIX) 40 MG tablet Take 1 tablet (40 mg total) by mouth daily. 90 tablet 1 02/12/2018 at 0700   Scheduled: . [START ON 02/13/2018] amLODipine  10 mg Oral Daily  . [START ON 02/13/2018] atorvastatin  10 mg Oral Q0600  . enoxaparin (LOVENOX) injection  40 mg Subcutaneous Q24H  . [START ON 02/13/2018] loratadine  10 mg Oral Daily  . [START ON 02/13/2018] losartan  50 mg Oral Daily  . metoprolol tartrate  25 mg Oral BID  . [START ON 02/13/2018] pantoprazole  40 mg Oral Daily    ROS: History obtained from the patient  General ROS: negative for - chills, fatigue, fever, night sweats, weight gain or weight loss Psychological ROS: negative for - behavioral disorder, hallucinations, memory difficulties, mood swings or suicidal ideation Ophthalmic ROS: negative for - blurry vision, double vision, eye pain or loss of vision ENT ROS:HOH using hearing aids Allergy and Immunology ROS: negative for - hives or itchy/watery eyes Hematological and Lymphatic ROS: negative for - bleeding problems, bruising or swollen lymph nodes Endocrine ROS: negative for - galactorrhea, hair pattern changes, polydipsia/polyuria or temperature intolerance Respiratory ROS: negative for - cough, hemoptysis, shortness of breath or wheezing Cardiovascular ROS: negative for - chest pain, dyspnea on  exertion, edema or irregular heartbeat Gastrointestinal ROS: negative for - abdominal pain, diarrhea, hematemesis, nausea/vomiting or stool incontinence Genito-Urinary ROS: negative for - dysuria, hematuria, incontinence or urinary frequency/urgency Musculoskeletal ROS: negative for - joint swelling or muscular weakness Neurological ROS: as noted in HPI Dermatological ROS: negative for rash and skin lesion changes  Physical Examination: Blood pressure (!) 183/98, pulse (!) 118, temperature  98.2 F (36.8 C), temperature source Oral, resp. rate 18, height 6' (1.829 m), weight 114.9 kg (253 lb 6.4 oz), SpO2 98 %.  HEENT-  Normocephalic, no lesions, without obvious abnormality.  Normal external eye and conjunctiva.  Normal TM's bilaterally.  Normal auditory canals and external ears. Normal external nose, mucus membranes and septum.  Normal pharynx. Cardiovascular- S1, S2 normal, pulses palpable throughout   Lungs- chest clear, no wheezing, rales, normal symmetric air entry Abdomen- soft, non-tender; bowel sounds normal; no masses,  no organomegaly Extremities- no edema Lymph-no adenopathy palpable Musculoskeletal-no joint tenderness, deformity or swelling Skin-warm and dry, no hyperpigmentation, vitiligo, or suspicious lesions  Neurological Examination   Mental Status: Alert, oriented, thought content appropriate.  Speech fluent without evidence of aphasia.  Able to follow 3 step commands without difficulty. Cranial Nerves: II: Discs flat bilaterally; Visual fields grossly normal, pupils equal, round, reactive to light and accommodation III,IV, VI: ptosis not present, extra-ocular motions intact bilaterally without nystagmus V,VII: smile symmetric, facial light touch sensation normal bilaterally VIII: hearing aid in place IX,X: gag reflex present XI: bilateral shoulder shrug XII: midline tongue extension Motor: Right : Upper extremity   5/5    Left:     Upper extremity   5/5  Lower extremity   5/5     Lower extremity   5/5 Tone and bulk:normal tone throughout; no atrophy noted Sensory: Pinprick and light touch intact throughout, bilaterally Deep Tendon Reflexes: 2+ and symmetric with absent AJ's bilaterally Plantars: Right: downgoing   Left: downgoing Cerebellar: Normal finger-to-nose, normal rapid alternating movements and normal heel-to-shin testing bilaterally Gait: not tested due to safety concerns    Laboratory Studies:   Basic Metabolic Panel: Recent Labs  Lab  02/12/18 0816  NA 134*  K 3.9  CL 99  CO2 26  GLUCOSE 119*  BUN 16  CREATININE 1.22  CALCIUM 9.3    Liver Function Tests: Recent Labs  Lab 02/12/18 0816  AST 39  ALT 39  ALKPHOS 81  BILITOT 1.7*  PROT 9.0*  ALBUMIN 4.7   No results for input(s): LIPASE, AMYLASE in the last 168 hours. No results for input(s): AMMONIA in the last 168 hours.  CBC: Recent Labs  Lab 02/12/18 0816  WBC 9.6  HGB 16.6  HCT 49.1  MCV 84.9  PLT 301    Cardiac Enzymes: Recent Labs  Lab 02/12/18 0816  TROPONINI <0.03    BNP: Invalid input(s): POCBNP  CBG: Recent Labs  Lab 02/12/18 0657  GLUCAP 121*    Microbiology: No results found for this or any previous visit.  Coagulation Studies: No results for input(s): LABPROT, INR in the last 72 hours.  Urinalysis:  Recent Labs  Lab 02/12/18 0816  COLORURINE COLORLESS*  LABSPEC 1.004*  PHURINE 6.0  GLUCOSEU NEGATIVE  HGBUR NEGATIVE  BILIRUBINUR NEGATIVE  KETONESUR NEGATIVE  PROTEINUR NEGATIVE  NITRITE NEGATIVE  LEUKOCYTESUR NEGATIVE    Lipid Panel:     Component Value Date/Time   CHOL 119 09/17/2016 0850   TRIG 129 09/17/2016 0850   HDL 42 09/17/2016 0850   CHOLHDL 2.8 09/17/2016 0850  LDLCALC 51 09/17/2016 0850    HgbA1C:  Lab Results  Component Value Date   HGBA1C 5.9 04/09/2014    Urine Drug Screen:  No results found for: LABOPIA, COCAINSCRNUR, LABBENZ, AMPHETMU, THCU, LABBARB  Alcohol Level: No results for input(s): ETH in the last 168 hours.  Other results: EKG: sinus tachycardia at 103 bpm.  Imaging: Ct Head Wo Contrast  Result Date: 02/12/2018 CLINICAL DATA:  Dizziness EXAM: CT HEAD WITHOUT CONTRAST TECHNIQUE: Contiguous axial images were obtained from the base of the skull through the vertex without intravenous contrast. COMPARISON:  None. FINDINGS: Brain: There is mild diffuse atrophy. There is no intracranial mass, hemorrhage, extra-axial fluid collection, or midline shift. There is mild  patchy small vessel disease in the centra semiovale bilaterally. Elsewhere gray-white compartments appear unremarkable. No acute infarct evident. Vascular: There is no appreciable hyperdense vessel. There is calcification in each carotid siphon region. Skull: Bony calvarium appears intact. Sinuses/Orbits: There is mucosal thickening in several ethmoid air cells bilaterally. Other visualized paranasal sinuses are clear. There is mild leftward deviation of the anterior nasal septum. Orbits appear symmetric bilaterally. Other: Visualized mastoid air cells are clear. IMPRESSION: Atrophy with mild periventricular small vessel disease. No mass or hemorrhage. No evident acute infarct. There are foci of arterial vascular calcification. There is mucosal thickening in several ethmoid air cells. Mild nasal septal deviation. Electronically Signed   By: Lowella Grip III M.D.   On: 02/12/2018 07:27     Assessment/Plan: 67 year old male presenting with acute onset, episodic vertigo.  Patient's symptoms appear somewhat positional and are not particularly consistent with acute cerebral ischemia.  Head CT reviewed and shows no acute changes.  MRI of the brain unable to be performed.  Patient is tachycardic, at times into the 130's.  Unclear if this may be related to his presentation.  Patient also will need ENT to evaluate for inner ear contribution. Echocardiogram and carotid dopplers are pending.   Recommendations: 1.  Cardiology evaluation 2.  ENT evaluation 3.  Orthostatic vitals 4.  ASA 81mg  daily 5.  May try Meclizine prn for symptom improvement but would not administer prior to ENT evaluation.    Alexis Goodell, MD Neurology [phone] 02/12/2018, 1:31 PM

## 2018-02-12 NOTE — Progress Notes (Signed)
   02/12/18 1505  Clinical Encounter Type  Visited With Patient and family together  Visit Type Initial  Referral From Nurse  Consult/Referral To [place]   [place] received an OR to educate the patient on an advance directive. [place] spoke with both the patient and his son, Helene Kelp. Paperwork was left with the patient to be completed if he chooses.

## 2018-02-13 DIAGNOSIS — R55 Syncope and collapse: Secondary | ICD-10-CM | POA: Diagnosis not present

## 2018-02-13 DIAGNOSIS — I10 Essential (primary) hypertension: Secondary | ICD-10-CM | POA: Diagnosis not present

## 2018-02-13 DIAGNOSIS — G459 Transient cerebral ischemic attack, unspecified: Secondary | ICD-10-CM | POA: Diagnosis not present

## 2018-02-13 DIAGNOSIS — R42 Dizziness and giddiness: Secondary | ICD-10-CM | POA: Diagnosis not present

## 2018-02-13 LAB — LIPID PANEL
Cholesterol: 101 mg/dL (ref 0–200)
HDL: 37 mg/dL — ABNORMAL LOW (ref >40)
LDL Cholesterol: 44 mg/dL (ref 0–99)
Total CHOL/HDL Ratio: 2.7 RATIO
Triglycerides: 100 mg/dL (ref <150)
VLDL: 20 mg/dL (ref 0–40)

## 2018-02-13 LAB — HEMOGLOBIN A1C
Hgb A1c MFr Bld: 6.3 % — ABNORMAL HIGH (ref 4.8–5.6)
MEAN PLASMA GLUCOSE: 134.11 mg/dL

## 2018-02-13 MED ORDER — METOPROLOL TARTRATE 25 MG PO TABS: 25.0000 mg | ORAL_TABLET | Freq: Two times a day (BID) | ORAL | 0 refills | Status: DC

## 2018-02-13 MED ORDER — ASPIRIN 81 MG PO TBEC: 81.0000 mg | DELAYED_RELEASE_TABLET | Freq: Every day | ORAL | Status: DC

## 2018-02-13 MED ORDER — AMLODIPINE BESYLATE 10 MG PO TABS: 5.0000 mg | ORAL_TABLET | Freq: Every day | ORAL | 1 refills | Status: DC

## 2018-02-13 NOTE — Progress Notes (Signed)
Physical Therapy Evaluation Patient Details Name: Juan Moreno MRN: [id number] DOB: Oct 22, 1950 Today's Date: 02/13/2018   History of Present Illness  Juan Moreno  is a 67 y.o. male with a known history of essential hypertension, GERD, hyperlipidemia who woke up at 3 AM yesterday morning and felt dizzy.  He  felt unsteady on his feet and could not walk.  Patient now back to baseline.  CT scan of the head is normal. Unable to get MRI due to implanted metal. Pt had another episode of dizziness yesterday in supine position with head rotated left during R carotid doppler exam.  All current exams up to this point have been negative.  Neurology has been consulted and has recommended possible referral to outpatient ENT. Patient reports no prior history of similar symptoms.  Denies any changes in vision or hearing during episode.  No aural fullness.  The patient denies any history of Mnire's disease or vestibular neuritis.  He denies any acute onset weakness or numbness/tingling in face or extremities.  Difficult to determine how long symptoms lasted but appeared to be multiple minutes in length and episodic in nature.  Patient relates symptoms to position changes and no known alleviating factors other than waiting for symptoms to pass.  Patient reports a history of tinnitus and he wears bilateral hearing aids due to chronic hearing loss.  Clinical Impression  Pt admitted with above diagnosis. Pt currently with functional limitations due to the deficits listed below (see PT Problem List).  Patient is independent with all bed mobility and transfers without an assistive device.  He is able to ambulate a full lap around nursing station without an assistive device. Able to perform horizontal and vertical head turns without change in gait speed or lateral gait deviation.  Speed is within functional limits for full community ambulation.  He is able to perform gait speech changes on command without lateral  gait deviation or imbalance as well.  All sensation and strength testing is within functional limits and symmetrical.  No face droop.  Extraocular motions intact with full motion.  Negative pronator drift, finger-to-nose, rapid alternating movements, finger opposition, and heel-to-shin bilaterally. No spontaneous or gaze evoked nystagmus noted.  VOR and VOR cancellation are both negative.  VOR thrust is mostly negative but possibly faint positive toward the right side.  Dix-Hallpike testing and horizontal canal testing are negative bilaterally.  Smooth pursuit is mildly saccadic and horizontal and vertical saccade testing is within normal limits.  Patient reports any chest pain or recent changes in medications.  Patient encouraged along with MD to follow-up with outpatient ENT to fully rule out any contribution of vestibular system to patient's symptoms.  Patient educated on BE FAST acronym for symptoms of stroke and encouraged to return to ER immediately if his symptoms return before he has followed up with his primary MD or ENT. No deficits identified during PT examination.  Order will be completed.  Please enter new order if status changes or needs arise.      Follow Up Recommendations No PT follow up    Equipment Recommendations  None recommended by PT    Recommendations for Other Services       Precautions / Restrictions Precautions Precautions: None Restrictions Weight Bearing Restrictions: No      Mobility  Bed Mobility Overal bed mobility: Independent             General bed mobility comments: Good speed and sequencing.  Transfers Overall transfer level: Independent Equipment used:  None             General transfer comment: Good speed, sequencing, and balance.  Steady and standing without assistive device.  No safety concerns.  Ambulation/Gait Ambulation/Gait assistance: Independent Gait Distance (Feet): 200 Feet Assistive device: None       General Gait  Details: Patient is able to ambulate a full lap around nursing station without an assistive device.  He is able to perform horizontal and vertical head turns without change in gait speed or lateral gait deviation.  Speed is within functional limits for full community ambulation.  He is able to perform gait speech changes on command without lateral gait deviation or imbalance.  Stairs            Wheelchair Mobility    Modified Rankin (Stroke Patients Only)       Balance Overall balance assessment: Needs assistance Sitting-balance support: No upper extremity supported Sitting balance-Leahy Scale: Normal     Standing balance support: No upper extremity supported Standing balance-Leahy Scale: Normal                               Pertinent Vitals/Pain Pain Assessment: No/denies pain    Home Living Family/patient expects to be discharged to:: Private residence Living Arrangements: Alone Available Help at Discharge: Family Type of Home: House Home Access: Stairs to enter Entrance Stairs-Rails: None Entrance Stairs-Number of Steps: 1 Home Layout: One level Home Equipment: None      Prior Function Level of Independence: Independent         Comments: Independent with ADLs/IADLs.  Ambulates without an assistive device for community distances.  Drives and works part-time 4 hours a day at [employer]. No falls     Hand Dominance   Dominant Hand: Right(Both)    Extremity/Trunk Assessment   Upper Extremity Assessment Upper Extremity Assessment: Overall WFL for tasks assessed    Lower Extremity Assessment Lower Extremity Assessment: Overall WFL for tasks assessed       Communication   Communication: No difficulties  Cognition Arousal/Alertness: Awake/alert Behavior During Therapy: WFL for tasks assessed/performed Overall Cognitive Status: Within Functional Limits for tasks assessed                                        General Comments       Exercises     Assessment/Plan    PT Assessment Patent does not need any further PT services  PT Problem List         PT Treatment Interventions      PT Goals (Current goals can be found in the Care Plan section)  Acute Rehab PT Goals PT Goal Formulation: All assessment and education complete, DC therapy    Frequency     Barriers to discharge        Co-evaluation               AM-PAC PT "6 Clicks" Daily Activity  Outcome Measure Difficulty turning over in bed (including adjusting bedclothes, sheets and blankets)?: None Difficulty moving from lying on back to sitting on the side of the bed? : None Difficulty sitting down on and standing up from a chair with arms (e.g., wheelchair, bedside commode, etc,.)?: None Help needed moving to and from a bed to chair (including a wheelchair)?: None Help needed walking in hospital room?: None  Help needed climbing 3-5 steps with a railing? : None 6 Click Score: 24    End of Session Equipment Utilized During Treatment: Gait belt Activity Tolerance: Patient tolerated treatment well Patient left: in bed;with call bell/phone within reach;with bed alarm set;with family/visitor present Nurse Communication: Other (comment)(Care manager notified that pt has no PT needs) PT Visit Diagnosis: Unsteadiness on feet (R26.81)    Time: 2567-2091 PT Time Calculation (min) (ACUTE ONLY): 37 min   Charges:   PT Evaluation $PT Eval Low Complexity: 1 Low     PT G Codes:        Stelios Kirby D Joesph Marcy PT, DPT, GCS   Chane Magner 02/13/2018, 9:41 AM

## 2018-02-13 NOTE — Care Management Note (Signed)
Case Management Note  Patient Details  Name: Juan Moreno MRN: [id number] Date of Birth: Jan 28, 1951  Subjective/Objective:                  Admitted to [facility] under observation status with the diagnosis of TIA. Lives alone. Son is [name] [phone]). Last seen Dr. Ronnald Ramp in February. Prescriptions are filled at [facility] in [place]. No home Health. No skilled Facility. No equipment in the home. Takes care of all basic and instrumental activities of daily living himself, drives. No falls. Good appetite. Son will transport  Action/Plan: No needs identified at this time.   Expected Discharge Date:  02/13/18               Expected Discharge Plan:     In-House Referral:     Discharge planning Services     Post Acute Care Choice:    Choice offered to:     DME Arranged:    DME Agency:     HH Arranged:    HH Agency:     Status of Service:     If discussed at [employer], dates discussed:    Additional Comments:  Shelbie Ammons, RN MSN CCM Care Management [phone] 02/13/2018, 8:32 AM

## 2018-02-13 NOTE — Progress Notes (Signed)
[facility] Cardiology [facility] Encounter Note  Patient: Juan Moreno / Admit Date: 02/12/2018 / Date of Encounter: 02/13/2018, 8:43 AM   Subjective: She feels much better today.  No evidence of significant vertigo and/or dizziness.  No chest pain shortness of breath or syncope.  Normal troponin no other significant EKG changes or telemetry changes  Review of Systems: Positive for: Vertigo Negative for: Vision change, hearing change, syncope, dizziness, nausea, vomiting,diarrhea, bloody stool, stomach pain, cough, congestion, diaphoresis, urinary frequency, urinary pain,skin lesions, skin rashes Others previously listed  Objective: Telemetry: Sinus rhythm Physical Exam: Blood pressure 112/75, pulse 70, temperature (!) 97.4 F (36.3 C), temperature source Oral, resp. rate 18, height 6' (1.829 m), weight 253 lb 6.4 oz (114.9 kg), SpO2 97 %. Body mass index is 34.37 kg/m. General: Well developed, well nourished, in no acute distress. Head: Normocephalic, atraumatic, sclera non-icteric, no xanthomas, nares are without discharge. Neck: No apparent masses Lungs: Normal respirations with no wheezes, no rhonchi, no rales , no crackles   Heart: Regular rate and rhythm, normal S1 S2, no murmur, no rub, no gallop, PMI is normal size and placement, carotid upstroke normal without bruit, jugular venous pressure normal Abdomen: Soft, non-tender, non-distended with normoactive bowel sounds. No hepatosplenomegaly. Abdominal aorta is normal size without bruit Extremities: No edema, no clubbing, no cyanosis, no ulcers,  Peripheral: 2+ radial, 2+ femoral, 2+ dorsal pedal pulses Neuro: Alert and oriented. Moves all extremities spontaneously. Psych:  Responds to questions appropriately with a normal affect.   Intake/Output Summary (Last 24 hours) at 02/13/2018 0843 Last data filed at 02/12/2018 1907 Gross per 24 hour  Intake 240 ml  Output 300 ml  Net -60 ml    Inpatient Medications:  .  amLODipine  10 mg Oral Daily  . aspirin EC  81 mg Oral Daily  . atorvastatin  40 mg Oral Q0600  . enoxaparin (LOVENOX) injection  40 mg Subcutaneous Q24H  . loratadine  10 mg Oral Daily  . losartan  50 mg Oral Daily  . metoprolol tartrate  25 mg Oral BID  . pantoprazole  40 mg Oral Daily   Infusions:   Labs: Recent Labs    02/12/18 0816  NA 134*  K 3.9  CL 99  CO2 26  GLUCOSE 119*  BUN 16  CREATININE 1.22  CALCIUM 9.3   Recent Labs    02/12/18 0816  AST 39  ALT 39  ALKPHOS 81  BILITOT 1.7*  PROT 9.0*  ALBUMIN 4.7   Recent Labs    02/12/18 0816  WBC 9.6  HGB 16.6  HCT 49.1  MCV 84.9  PLT 301   Recent Labs    02/12/18 0816  TROPONINI <0.03   Invalid input(s): POCBNP Recent Labs    02/13/18 0348  HGBA1C 6.3*     Weights: Filed Weights   02/12/18 0646 02/12/18 1034  Weight: 255 lb (115.7 kg) 253 lb 6.4 oz (114.9 kg)     Radiology/Studies:  Ct Head Wo Contrast  Result Date: 02/12/2018 CLINICAL DATA:  Dizziness EXAM: CT HEAD WITHOUT CONTRAST TECHNIQUE: Contiguous axial images were obtained from the base of the skull through the vertex without intravenous contrast. COMPARISON:  None. FINDINGS: Brain: There is mild diffuse atrophy. There is no intracranial mass, hemorrhage, extra-axial fluid collection, or midline shift. There is mild patchy small vessel disease in the centra semiovale bilaterally. Elsewhere gray-white compartments appear unremarkable. No acute infarct evident. Vascular: There is no appreciable hyperdense vessel. There is calcification in each  carotid siphon region. Skull: Bony calvarium appears intact. Sinuses/Orbits: There is mucosal thickening in several ethmoid air cells bilaterally. Other visualized paranasal sinuses are clear. There is mild leftward deviation of the anterior nasal septum. Orbits appear symmetric bilaterally. Other: Visualized mastoid air cells are clear. IMPRESSION: Atrophy with mild periventricular small vessel disease.  No mass or hemorrhage. No evident acute infarct. There are foci of arterial vascular calcification. There is mucosal thickening in several ethmoid air cells. Mild nasal septal deviation. Electronically Signed   By: Lowella Grip III M.D.   On: 02/12/2018 07:27   US Carotid Bilateral (at [facility] Only)  Result Date: 02/12/2018 CLINICAL DATA:  67 year old male with a history of TIA. Cardiovascular risk factors include hypertension, known stroke/TIA, hyperlipidemia, tobacco use EXAM: BILATERAL CAROTID DUPLEX ULTRASOUND TECHNIQUE: Pearline Cables scale imaging, color Doppler and duplex ultrasound were performed of bilateral carotid and vertebral arteries in the neck. COMPARISON:  None. FINDINGS: Criteria: Quantification of carotid stenosis is based on velocity parameters that correlate the residual internal carotid diameter with NASCET-based stenosis levels, using the diameter of the distal internal carotid lumen as the denominator for stenosis measurement. The following velocity measurements were obtained: RIGHT ICA:  Systolic 85 cm/sec, Diastolic 26 cm/sec CCA:  83 cm/sec SYSTOLIC ICA/CCA RATIO:  1.0 ECA:  85 cm/sec LEFT ICA:  Systolic 76 cm/sec, Diastolic 18 cm/sec CCA:  502 cm/sec SYSTOLIC ICA/CCA RATIO:  0.6 ECA:  108 cm/sec Right Brachial SBP: Not acquired Left Brachial SBP: Not acquired RIGHT CAROTID ARTERY: No significant calcified disease of the right common carotid artery. Intermediate waveform maintained. Heterogeneous plaque without significant calcifications at the right carotid bifurcation. Low resistance waveform of the right ICA. No significant tortuosity. RIGHT VERTEBRAL ARTERY: Antegrade flow with low resistance waveform. LEFT CAROTID ARTERY: No significant calcified disease of the left common carotid artery. Intermediate waveform maintained. Heterogeneous plaque at the left carotid bifurcation without significant calcifications. Low resistance waveform of the left ICA. LEFT VERTEBRAL ARTERY:   Antegrade flow with low resistance waveform. IMPRESSION: Color duplex indicates minimal heterogeneous plaque, with no hemodynamically significant stenosis by duplex criteria in the extracranial cerebrovascular circulation. Signed, Dulcy Fanny. Dellia Nims, RPVI [facility] Radiology Electronically Signed   By: Corrie Mckusick D.O.   On: 02/12/2018 14:29     Assessment and Recommendation  67 y.o. male with known essential hypertension mixed hyperlipidemia on a previously appropriate medication management with vertigo and no current evidence of myocardial infarction true syncope 1.  Continue supportive care and treatment of vertigo and possible inner ear issues 2.  Continue antihypertensive medication management as before without change 3.  High intensity cholesterol therapy 4.  No further cardiac diagnostics necessary at this time 5.  Okay for discharged home from cardiac standpoint with follow-up in 1 to 2 weeks for further assessment and treatment if necessary  Signed, Serafina Royals M.D. FACC

## 2018-02-13 NOTE — Discharge Summary (Signed)
[name], 67 y.o., DOB [birth year], MRN [id number]. Admission date: 02/12/2018 Discharge Date 02/13/2018 Primary MD Juline Patch, MD Admitting Physician Dustin Flock, MD  Admission Diagnosis  Ataxia [R27.0]  Discharge Diagnosis   Active Problems: TIA Essential HTN GERD Sinus tachycardia      Hospital Course  Juan Moreno  is a 67 y.o. male with a known history of essential hypertension, GERD, hyperlipidemia who woke up at 3 AM this morning and felt dizzy.    Patient was admitted for possible TIA.  He could not have an MRI.  Carotid Dopplers and echo showed no significant abnormality to explain his symptoms.  He never after being admitted patient had episodes of elevated heart rate and feeling dizzy.  Patient was started on metoprolol with improvement in his heart rate currently he is asymptomatic feeling better stable for discharge.           Consults  cardiology, neurology  Significant Tests:  See full reports for all details    Ct Head Wo Contrast  Result Date: 02/12/2018 CLINICAL DATA:  Dizziness EXAM: CT HEAD WITHOUT CONTRAST TECHNIQUE: Contiguous axial images were obtained from the base of the skull through the vertex without intravenous contrast. COMPARISON:  None. FINDINGS: Brain: There is mild diffuse atrophy. There is no intracranial mass, hemorrhage, extra-axial fluid collection, or midline shift. There is mild patchy small vessel disease in the centra semiovale bilaterally. Elsewhere gray-white compartments appear unremarkable. No acute infarct evident. Vascular: There is no appreciable hyperdense vessel. There is calcification in each carotid siphon region. Skull: Bony calvarium appears intact. Sinuses/Orbits: There is mucosal thickening in several ethmoid air cells bilaterally. Other visualized paranasal sinuses are clear. There is mild leftward deviation of the anterior nasal septum. Orbits appear  symmetric bilaterally. Other: Visualized mastoid air cells are clear. IMPRESSION: Atrophy with mild periventricular small vessel disease. No mass or hemorrhage. No evident acute infarct. There are foci of arterial vascular calcification. There is mucosal thickening in several ethmoid air cells. Mild nasal septal deviation. Electronically Signed   By: Lowella Grip III M.D.   On: 02/12/2018 07:27   US Carotid Bilateral (at [facility] Only)  Result Date: 02/12/2018 CLINICAL DATA:  67 year old male with a history of TIA. Cardiovascular risk factors include hypertension, known stroke/TIA, hyperlipidemia, tobacco use EXAM: BILATERAL CAROTID DUPLEX ULTRASOUND TECHNIQUE: Pearline Cables scale imaging, color Doppler and duplex ultrasound were performed of bilateral carotid and vertebral arteries in the neck. COMPARISON:  None. FINDINGS: Criteria: Quantification of carotid stenosis is based on velocity parameters that correlate the residual internal carotid diameter with NASCET-based stenosis levels, using the diameter of the distal internal carotid lumen as the denominator for stenosis measurement. The following velocity measurements were obtained: RIGHT ICA:  Systolic 85 cm/sec, Diastolic 26 cm/sec CCA:  83 cm/sec SYSTOLIC ICA/CCA RATIO:  1.0 ECA:  85 cm/sec LEFT ICA:  Systolic 76 cm/sec, Diastolic 18 cm/sec CCA:  250 cm/sec SYSTOLIC ICA/CCA RATIO:  0.6 ECA:  108 cm/sec Right Brachial SBP: Not acquired Left Brachial SBP: Not acquired RIGHT CAROTID ARTERY: No significant calcified disease of the right common carotid artery. Intermediate waveform maintained. Heterogeneous plaque without significant calcifications at the right carotid bifurcation. Low resistance waveform of the right ICA. No significant tortuosity. RIGHT VERTEBRAL ARTERY: Antegrade flow with low resistance waveform. LEFT CAROTID ARTERY: No significant calcified disease of the left common carotid artery. Intermediate waveform maintained. Heterogeneous plaque at  the left carotid bifurcation without  significant calcifications. Low resistance waveform of the left ICA. LEFT VERTEBRAL ARTERY:  Antegrade flow with low resistance waveform. IMPRESSION: Color duplex indicates minimal heterogeneous plaque, with no hemodynamically significant stenosis by duplex criteria in the extracranial cerebrovascular circulation. Signed, Dulcy Fanny. Dellia Nims, RPVI [facility] Radiology Electronically Signed   By: Corrie Mckusick D.O.   On: 02/12/2018 14:29       Today   Subjective:   Juan Moreno patient feeling better no further symptoms Objective:   Blood pressure 136/82, pulse 83, temperature 98.1 F (36.7 C), temperature source Oral, resp. rate 18, height 6' (1.829 m), weight 114.9 kg (253 lb 6.4 oz), SpO2 96 %.  .  Intake/Output Summary (Last 24 hours) at 02/13/2018 1327 Last data filed at 02/13/2018 1009 Gross per 24 hour  Intake 480 ml  Output -  Net 480 ml    Exam VITAL SIGNS: Blood pressure 136/82, pulse 83, temperature 98.1 F (36.7 C), temperature source Oral, resp. rate 18, height 6' (1.829 m), weight 114.9 kg (253 lb 6.4 oz), SpO2 96 %.  GENERAL:  67 y.o.-year-old patient lying in the bed with no acute distress.  EYES: Pupils equal, round, reactive to light and accommodation. No scleral icterus. Extraocular muscles intact.  HEENT: Head atraumatic, normocephalic. Oropharynx and nasopharynx clear.  NECK:  Supple, no jugular venous distention. No thyroid enlargement, no tenderness.  LUNGS: Normal breath sounds bilaterally, no wheezing, rales,rhonchi or crepitation. No use of accessory muscles of respiration.  CARDIOVASCULAR: S1, S2 normal. No murmurs, rubs, or gallops.  ABDOMEN: Soft, nontender, nondistended. Bowel sounds present. No organomegaly or mass.  EXTREMITIES: No pedal edema, cyanosis, or clubbing.  NEUROLOGIC: Cranial nerves II through XII are intact. Muscle strength 5/5 in all extremities.  Sensation intact. Gait not checked.  PSYCHIATRIC: The patient is alert and oriented x 3.  SKIN: No obvious rash, lesion, or ulcer.   Data Review     CBC w Diff:  Lab Results  Component Value Date   WBC 9.6 02/12/2018   HGB 16.6 02/12/2018   HGB 15.2 08/26/2014   HCT 49.1 02/12/2018   HCT 45.1 08/26/2014   PLT 301 02/12/2018   PLT 261 08/26/2014   LYMPHOPCT 28.8 08/26/2014   MONOPCT 9.7 08/26/2014   EOSPCT 4.1 08/26/2014   BASOPCT 0.8 08/26/2014   CMP:  Lab Results  Component Value Date   NA 134 (L) 02/12/2018   NA 139 09/23/2017   NA 137 08/26/2014   K 3.9 02/12/2018   K 3.7 08/26/2014   CL 99 02/12/2018   CL 101 08/26/2014   CO2 26 02/12/2018   CO2 26 08/26/2014   BUN 16 02/12/2018   BUN 14 09/23/2017   BUN 16 08/26/2014   CREATININE 1.22 02/12/2018   CREATININE 1.50 (H) 08/26/2014   PROT 9.0 (H) 02/12/2018   ALBUMIN 4.7 02/12/2018   ALBUMIN 4.6 09/23/2017   ALBUMIN 4.1 04/09/2014   BILITOT 1.7 (H) 02/12/2018   ALKPHOS 81 02/12/2018   AST 39 02/12/2018   ALT 39 02/12/2018  .  Micro Results No results found for this or any previous visit (from the past 240 hour(s)).      Code Status Orders  (From admission, onward)        Start     Ordered   02/12/18 1027  Full code  Continuous     02/12/18 1026    Code Status History    This patient has a current code status but no  historical code status.          Follow-up Information    Juline Patch, MD. Go on 02/24/2018.   Specialty:  Family Medicine Why:  @3 :15 PM Contact information: [address] [place] [address] [phone]        Beverly Gust, MD. Go on 02/14/2018.   Specialty:  Otolaryngology Why:  @1 :45 PM BRING ID/INSURANCE/MED LIST Contact information: [place] [address] [phone]           Discharge Medications   Allergies as of 02/13/2018      Reactions   Penicillins Rash      Medication List     TAKE these medications   amLODipine 10 MG tablet Commonly known as:  NORVASC Take 0.5 tablets (5 mg total) by mouth daily. What changed:  how much to take   aspirin 81 MG EC tablet Take 1 tablet (81 mg total) by mouth daily.   atorvastatin 10 MG tablet Commonly known as:  LIPITOR Take 1 tablet (10 mg total) by mouth daily at 6 (six) AM.   loratadine 10 MG tablet Commonly known as:  CLARITIN Take 10 mg by mouth daily.   losartan 50 MG tablet Commonly known as:  COZAAR Take 1 tablet (50 mg total) by mouth daily.   metoprolol tartrate 25 MG tablet Commonly known as:  LOPRESSOR Take 1 tablet (25 mg total) by mouth 2 (two) times daily.   pantoprazole 40 MG tablet Commonly known as:  PROTONIX Take 1 tablet (40 mg total) by mouth daily.          Total Time in preparing paper work, data evaluation and todays exam - 1 minutes  Dustin Flock M.D on 02/13/2018 at [place]  [phone]

## 2018-02-13 NOTE — Progress Notes (Signed)
Discharge instructions along with home medications and follow up gone over with patient. He verbalized that he understood instructions. No prescriptions given to patient. IV and tele removed. Pt being discharged home on room air, no distress noted. Ammie Dalton, RN

## 2018-02-13 NOTE — Progress Notes (Signed)
Subjective: Patient reports that vertigo continues although improved.  Was able t go to the bathroom last evening on his own.    Objective: Current vital signs: BP 136/82 (BP Location: Left Arm)   Pulse 83   Temp 98.1 F (36.7 C) (Oral)   Resp 18   Ht 6' (1.829 m)   Wt 114.9 kg (253 lb 6.4 oz)   SpO2 96%   BMI 34.37 kg/m  Vital signs in last 24 hours: Temp:  [97.3 F (36.3 C)-98.7 F (37.1 C)] 98.1 F (36.7 C) (07/18 0800) Pulse Rate:  [70-118] 83 (07/18 0800) Resp:  [16-18] 18 (07/18 0800) BP: (110-183)/(73-107) 136/82 (07/18 0800) SpO2:  [94 %-98 %] 96 % (07/18 0800) Weight:  [114.9 kg (253 lb 6.4 oz)] 114.9 kg (253 lb 6.4 oz) (07/17 1034)  Intake/Output from previous day: 07/17 0701 - 07/18 0700 In: 240 [P.O.:240] Out: 300 [Urine:300] Intake/Output this shift: Total I/O In: 240 [P.O.:240] Out: -  Nutritional status:  Diet Order           Diet - low sodium heart healthy        Diet Heart Room service appropriate? Yes; Fluid consistency: Thin  Diet effective now          Neurologic Exam: Mental Status: Alert, oriented, thought content appropriate.  Speech fluent without evidence of aphasia.  Able to follow 3 step commands without difficulty. Cranial Nerves: II: Discs flat bilaterally; Visual fields grossly normal, pupils equal, round, reactive to light and accommodation III,IV, VI: ptosis not present, extra-ocular motions intact bilaterally without nystagmus V,VII: smile symmetric, facial light touch sensation normal bilaterally VIII: hearing aid in place IX,X: gag reflex present XI: bilateral shoulder shrug XII: midline tongue extension Motor: 5/5 throughout  Lab Results: Basic Metabolic Panel: Recent Labs  Lab 02/12/18 0816  NA 134*  K 3.9  CL 99  CO2 26  GLUCOSE 119*  BUN 16  CREATININE 1.22  CALCIUM 9.3    Liver Function Tests: Recent Labs  Lab 02/12/18 0816  AST 39  ALT 39  ALKPHOS 81  BILITOT 1.7*  PROT 9.0*  ALBUMIN 4.7   No  results for input(s): LIPASE, AMYLASE in the last 168 hours. No results for input(s): AMMONIA in the last 168 hours.  CBC: Recent Labs  Lab 02/12/18 0816  WBC 9.6  HGB 16.6  HCT 49.1  MCV 84.9  PLT 301    Cardiac Enzymes: Recent Labs  Lab 02/12/18 0816  TROPONINI <0.03    Lipid Panel: Recent Labs  Lab 02/13/18 0348  CHOL 101  TRIG 100  HDL 37*  CHOLHDL 2.7  VLDL 20  LDLCALC 44    CBG: Recent Labs  Lab 02/12/18 0657  GLUCAP 121*    Microbiology: No results found for this or any previous visit.  Coagulation Studies: No results for input(s): LABPROT, INR in the last 72 hours.  Imaging: Ct Head Wo Contrast  Result Date: 02/12/2018 CLINICAL DATA:  Dizziness EXAM: CT HEAD WITHOUT CONTRAST TECHNIQUE: Contiguous axial images were obtained from the base of the skull through the vertex without intravenous contrast. COMPARISON:  None. FINDINGS: Brain: There is mild diffuse atrophy. There is no intracranial mass, hemorrhage, extra-axial fluid collection, or midline shift. There is mild patchy small vessel disease in the centra semiovale bilaterally. Elsewhere gray-white compartments appear unremarkable. No acute infarct evident. Vascular: There is no appreciable hyperdense vessel. There is calcification in each carotid siphon region. Skull: Bony calvarium appears intact. Sinuses/Orbits: There is mucosal thickening  in several ethmoid air cells bilaterally. Other visualized paranasal sinuses are clear. There is mild leftward deviation of the anterior nasal septum. Orbits appear symmetric bilaterally. Other: Visualized mastoid air cells are clear. IMPRESSION: Atrophy with mild periventricular small vessel disease. No mass or hemorrhage. No evident acute infarct. There are foci of arterial vascular calcification. There is mucosal thickening in several ethmoid air cells. Mild nasal septal deviation. Electronically Signed   By: Lowella Grip III M.D.   On: 02/12/2018 07:27   US  Carotid Bilateral (at [facility])  Result Date: 02/12/2018 CLINICAL DATA:  67 year old male with a history of TIA. Cardiovascular risk factors include hypertension, known stroke/TIA, hyperlipidemia, tobacco use EXAM: BILATERAL CAROTID DUPLEX ULTRASOUND TECHNIQUE: Pearline Cables scale imaging, color Doppler and duplex ultrasound were performed of bilateral carotid and vertebral arteries in the neck. COMPARISON:  None. FINDINGS: Criteria: Quantification of carotid stenosis is based on velocity parameters that correlate the residual internal carotid diameter with NASCET-based stenosis levels, using the diameter of the distal internal carotid lumen as the denominator for stenosis measurement. The following velocity measurements were obtained: RIGHT ICA:  Systolic 85 cm/sec, Diastolic 26 cm/sec CCA:  83 cm/sec SYSTOLIC ICA/CCA RATIO:  1.0 ECA:  85 cm/sec LEFT ICA:  Systolic 76 cm/sec, Diastolic 18 cm/sec CCA:  102 cm/sec SYSTOLIC ICA/CCA RATIO:  0.6 ECA:  108 cm/sec Right Brachial SBP: Not acquired Left Brachial SBP: Not acquired RIGHT CAROTID ARTERY: No significant calcified disease of the right common carotid artery. Intermediate waveform maintained. Heterogeneous plaque without significant calcifications at the right carotid bifurcation. Low resistance waveform of the right ICA. No significant tortuosity. RIGHT VERTEBRAL ARTERY: Antegrade flow with low resistance waveform. LEFT CAROTID ARTERY: No significant calcified disease of the left common carotid artery. Intermediate waveform maintained. Heterogeneous plaque at the left carotid bifurcation without significant calcifications. Low resistance waveform of the left ICA. LEFT VERTEBRAL ARTERY:  Antegrade flow with low resistance waveform. IMPRESSION: Color duplex indicates minimal heterogeneous plaque, with no hemodynamically significant stenosis by duplex criteria in the extracranial cerebrovascular circulation. Signed, Dulcy Fanny. Dellia Nims, RPVI [facility] Electronically Signed   By: Corrie Mckusick D.O.   On: 02/12/2018 14:29    Medications:  I have reviewed the patient's current medications. Scheduled: . amLODipine  10 mg Oral Daily  . aspirin EC  81 mg Oral Daily  . atorvastatin  40 mg Oral Q0600  . enoxaparin (LOVENOX) injection  40 mg Subcutaneous Q24H  . loratadine  10 mg Oral Daily  . losartan  50 mg Oral Daily  . metoprolol tartrate  25 mg Oral BID  . pantoprazole  40 mg Oral Daily    Assessment/Plan: Patient improved.  Cardiology involved in patient care.  Per patient to see ENT on an outpatient basis.    Recommendations: 1.  Meclizine 25mg  q 8 hours prn vertigo 2.  Patient to follow up with PCP at discharge.     LOS: 0 days   Alexis Goodell, MD Neurology [phone] 02/13/2018  10:31 AM

## 2018-02-14 ENCOUNTER — Telehealth: Payer: Self-pay

## 2018-02-14 DIAGNOSIS — R42 Dizziness and giddiness: Secondary | ICD-10-CM | POA: Diagnosis not present

## 2018-02-14 NOTE — Telephone Encounter (Signed)
Transition Care Management Follow-up Telephone Call  Date of discharge and from where: 02/13/18 from [facility]  How have you been since you were released from the hospital? States dizziness has improved. Denies chest pain/pressure, headache, dyspnea, nausea.   Any questions or concerns? No   Items Reviewed:  Did the pt receive and understand the discharge instructions provided? Yes   Medications obtained and verified? Yes   Any new allergies since your discharge? No   Dietary orders reviewed? Yes  Do you have support at home? Yes   Functional Questionnaire: (I = Independent and D = Dependent) ADL's: I  Bathing/Dressing- I   Meal Prep- I  Eating- I  Maintaining continence- I  Transferring/Ambulation- I  Managing Meds- I   Follow up appointments reviewed:    PCP Hospital f/u appt confirmed? Yes  Scheduled to see Dr. Ronnald Ramp on 02/24/18 @ 3:15pm.  [facility] f/u appt confirmed? Yes  Scheduled to see Dr. Tami Ribas on 02/14/18 @ 1:45pm.  Are transportation arrangements needed? No   If their condition worsens, is the pt aware to call  their PCP or go to the ED? Yes  Was the patient provided with contact information for the PCP's office or ED? Yes  Was the pt encouraged to call back with questions or concerns? Yes

## 2018-02-19 DIAGNOSIS — Z48817 Encounter for surgical aftercare following surgery on the skin and subcutaneous tissue: Secondary | ICD-10-CM | POA: Diagnosis not present

## 2018-02-19 DIAGNOSIS — L218 Other seborrheic dermatitis: Secondary | ICD-10-CM | POA: Diagnosis not present

## 2018-02-24 ENCOUNTER — Encounter: Payer: Self-pay | Admitting: Family Medicine

## 2018-02-24 ENCOUNTER — Ambulatory Visit (INDEPENDENT_AMBULATORY_CARE_PROVIDER_SITE_OTHER): Payer: Medicare Other | Admitting: Family Medicine

## 2018-02-24 VITALS — BP 112/80 | HR 84 | Ht 72.0 in | Wt 253.0 lb

## 2018-02-24 DIAGNOSIS — I10 Essential (primary) hypertension: Secondary | ICD-10-CM

## 2018-02-24 DIAGNOSIS — K2100 Gastro-esophageal reflux disease with esophagitis, without bleeding: Secondary | ICD-10-CM

## 2018-02-24 DIAGNOSIS — Z09 Encounter for follow-up examination after completed treatment for conditions other than malignant neoplasm: Secondary | ICD-10-CM | POA: Diagnosis not present

## 2018-02-24 DIAGNOSIS — E782 Mixed hyperlipidemia: Secondary | ICD-10-CM

## 2018-02-24 MED ORDER — AMLODIPINE BESYLATE 5 MG PO TABS: 5.0000 mg | ORAL_TABLET | Freq: Every day | ORAL | 1 refills | Status: DC

## 2018-02-24 MED ORDER — ATORVASTATIN CALCIUM 10 MG PO TABS: 10.0000 mg | ORAL_TABLET | Freq: Every day | ORAL | 1 refills | Status: DC

## 2018-02-24 MED ORDER — METOPROLOL SUCCINATE ER 25 MG PO TB24: 25.0000 mg | ORAL_TABLET | Freq: Every day | ORAL | 1 refills | Status: DC

## 2018-02-24 MED ORDER — LOSARTAN POTASSIUM 50 MG PO TABS: 50.0000 mg | ORAL_TABLET | Freq: Every day | ORAL | 1 refills | Status: DC

## 2018-02-24 MED ORDER — PANTOPRAZOLE SODIUM 40 MG PO TBEC: 40.0000 mg | DELAYED_RELEASE_TABLET | Freq: Every day | ORAL | 1 refills | Status: DC

## 2018-02-24 NOTE — Progress Notes (Signed)
Name: Juan Moreno   MRN: [id number]    DOB: [birth year]   Date:02/24/2018       Progress Note  Subjective  Chief Complaint  Chief Complaint  Patient presents with  . Follow-up    hospital follow up- d/c on 02/13/18. Dx Ataxia and possible TIA    Pt was recently admitted to [facility] on 02/12/18 for ataxia and possible TIA and was discharged on 02/13/18. Transition of care call placed on 02/14/18. By Hardie Pulley.Marland Kitchen   Hypertension  This is a chronic problem. The current episode started more than 1 year ago. The problem has been waxing and waning since onset. The problem is controlled. Pertinent negatives include no anxiety, blurred vision, chest pain, headaches, malaise/fatigue, neck pain, orthopnea, palpitations, peripheral edema, PND, shortness of breath or sweats. There are no associated agents to hypertension. There are no known risk factors for coronary artery disease. Past treatments include angiotensin blockers, calcium channel blockers and beta blockers. The current treatment provides moderate improvement. There is no history of angina, kidney disease, CAD/MI, CVA, heart failure, left ventricular hypertrophy, PVD or retinopathy. There is no history of chronic renal disease, a hypertension causing med or renovascular disease.  Hyperlipidemia  This is a chronic problem. The current episode started more than 1 year ago. The problem is controlled. Recent lipid tests were reviewed and are normal. He has no history of chronic renal disease. Pertinent negatives include no chest pain, focal weakness, myalgias or shortness of breath. Current antihyperlipidemic treatment includes statins. The current treatment provides moderate improvement of lipids. There are no compliance problems.   Gastroesophageal Reflux  He reports no abdominal pain, no chest pain, no choking, no coughing, no dysphagia, no heartburn, no hoarse voice, no nausea, no sore throat or no wheezing. This is a chronic  problem. The problem occurs rarely. The problem has been unchanged. The symptoms are aggravated by certain foods. Pertinent negatives include no anemia, fatigue, melena, muscle weakness or weight loss. He has tried a PPI (protonix) for the symptoms. The treatment provided moderate relief.    Essential hypertension Chronic. Controlled. Will continue losartan 50 mg and amlodipine 5 mg. Will switch to metoprolol 25 mg XL to consolidate to all morning dosing.  Gastroesophageal reflux disease with esophagitis Chronic. Controlled. Continue pantoprazole 40mg  daily   Hyperlipidemia Chronic Controlled Continue atorvastatin 10 mg daily.    Past Medical History:  Diagnosis Date  . GERD (gastroesophageal reflux disease)   . Hyperlipidemia   . Hypertension     Past Surgical History:  Procedure Laterality Date  . COLONOSCOPY  2013   cleared for 4 yrs- Dr Vira Agar- [place]  . COLONOSCOPY WITH PROPOFOL N/A 12/26/2016   Procedure: COLONOSCOPY WITH PROPOFOL;  Surgeon: Manya Silvas, MD;  Location: [facility] ENDOSCOPY;  Service: Endoscopy;  Laterality: N/A;  . STAPEDES SURGERY      Family History  Problem Relation Age of Onset  . Hypertension Mother   . Hypertension Father     Social History   Socioeconomic History  . Marital status: Widowed    Spouse name: Not on file  . Number of children: Not on file  . Years of education: Not on file  . Highest education level: Not on file  Occupational History  . Not on file  Social Needs  . Financial resource strain: Not on file  . Food insecurity:    Worry: Not on file    Inability: Not on file  . Transportation needs:    Medical:  Not on file    Non-medical: Not on file  Tobacco Use  . Smoking status: Never Smoker  . Smokeless tobacco: Former Systems developer    Types: Chew  Substance and Sexual Activity  . Alcohol use: Yes    Alcohol/week: 0.0 oz    Comment: rare  . Drug use: No  . Sexual activity: Never  Lifestyle  . Physical activity:    Days  per week: Not on file    Minutes per session: Not on file  . Stress: Not on file  Relationships  . Social connections:    Talks on phone: Not on file    Gets together: Not on file    Attends religious service: Not on file    Active member of club or organization: Not on file    Attends meetings of clubs or organizations: Not on file    Relationship status: Not on file  . Intimate partner violence:    Fear of current or ex partner: Not on file    Emotionally abused: Not on file    Physically abused: Not on file    Forced sexual activity: Not on file  Other Topics Concern  . Not on file  Social History Narrative  . Not on file    Allergies  Allergen Reactions  . Penicillins Rash    Outpatient Medications Prior to Visit  Medication Sig Dispense Refill  . aspirin EC 81 MG EC tablet Take 1 tablet (81 mg total) by mouth daily.    Marland Kitchen loratadine (CLARITIN) 10 MG tablet Take 10 mg by mouth daily.     Marland Kitchen amLODipine (NORVASC) 10 MG tablet Take 0.5 tablets (5 mg total) by mouth daily. 90 tablet 1  . atorvastatin (LIPITOR) 10 MG tablet Take 1 tablet (10 mg total) by mouth daily at 6 (six) AM. 90 tablet 1  . losartan (COZAAR) 50 MG tablet Take 1 tablet (50 mg total) by mouth daily. 90 tablet 1  . metoprolol tartrate (LOPRESSOR) 25 MG tablet Take 1 tablet (25 mg total) by mouth 2 (two) times daily. 60 tablet 0  . pantoprazole (PROTONIX) 40 MG tablet Take 1 tablet (40 mg total) by mouth daily. 90 tablet 1   No facility-administered medications prior to visit.     Review of Systems  Constitutional: Negative for chills, fatigue, fever, malaise/fatigue and weight loss.  HENT: Negative for ear discharge, ear pain, hoarse voice and sore throat.   Eyes: Negative for blurred vision.  Respiratory: Negative for cough, sputum production, choking, shortness of breath and wheezing.   Cardiovascular: Negative for chest pain, palpitations, orthopnea, leg swelling and PND.  Gastrointestinal: Negative  for abdominal pain, blood in stool, constipation, diarrhea, dysphagia, heartburn, melena and nausea.  Genitourinary: Negative for dysuria, frequency, hematuria and urgency.  Musculoskeletal: Negative for back pain, joint pain, myalgias, muscle weakness and neck pain.  Skin: Negative for rash.  Neurological: Negative for dizziness, tingling, sensory change, focal weakness and headaches.  Endo/Heme/Allergies: Negative for environmental allergies and polydipsia. Does not bruise/bleed easily.  Psychiatric/Behavioral: Negative for depression and suicidal ideas. The patient is not nervous/anxious and does not have insomnia.      Objective  Vitals:   02/24/18 1515  BP: 112/80  Pulse: 84  Weight: 253 lb (114.8 kg)  Height: 6' (1.829 m)    Physical Exam  Constitutional: He is oriented to person, place, and time.  HENT:  Head: Normocephalic.  Right Ear: External ear normal.  Left Ear: External ear normal.  Nose:  Nose normal.  Mouth/Throat: Oropharynx is clear and moist.  Eyes: Pupils are equal, round, and reactive to light. Conjunctivae and EOM are normal. Right eye exhibits no discharge. Left eye exhibits no discharge. No scleral icterus.  Neck: Normal range of motion. Neck supple. No JVD present. No tracheal deviation present. No thyromegaly present.  Cardiovascular: Normal rate, regular rhythm, normal heart sounds and intact distal pulses. Exam reveals no gallop and no friction rub.  No murmur heard. Pulmonary/Chest: Breath sounds normal. No respiratory distress. He has no wheezes. He has no rales.  Abdominal: Soft. Bowel sounds are normal. He exhibits no mass. There is no hepatosplenomegaly. There is no tenderness. There is no rebound, no guarding and no CVA tenderness.  Musculoskeletal: Normal range of motion. He exhibits no edema or tenderness.  Lymphadenopathy:    He has no cervical adenopathy.  Neurological: He is alert and oriented to person, place, and time. He has normal  strength and normal reflexes. No cranial nerve deficit.  Skin: Skin is warm. No rash noted.  Nursing note and vitals reviewed.     Assessment & Plan  Problem List Items Addressed This Visit      Cardiovascular and Mediastinum   Essential hypertension    Chronic. Controlled. Will continue losartan 50 mg and amlodipine 5 mg. Will switch to metoprolol 25 mg XL to consolidate to all morning dosing.      Relevant Medications   metoprolol succinate (TOPROL-XL) 25 MG 24 hr tablet   losartan (COZAAR) 50 MG tablet   amLODipine (NORVASC) 5 MG tablet   atorvastatin (LIPITOR) 10 MG tablet     Digestive   Gastroesophageal reflux disease with esophagitis    Chronic. Controlled. Continue pantoprazole 40mg  daily       Relevant Medications   pantoprazole (PROTONIX) 40 MG tablet     Other   Hyperlipidemia    Chronic Controlled Continue atorvastatin 10 mg daily.       Relevant Medications   metoprolol succinate (TOPROL-XL) 25 MG 24 hr tablet   losartan (COZAAR) 50 MG tablet   amLODipine (NORVASC) 5 MG tablet   atorvastatin (LIPITOR) 10 MG tablet    Other Visit Diagnoses    Hospital discharge follow-up    -  Primary   Patient for transfer of care for evaluation of ataxia/ benign positional vertigo and hypertension management.      Meds ordered this encounter  Medications  . metoprolol succinate (TOPROL-XL) 25 MG 24 hr tablet    Sig: Take 1 tablet (25 mg total) by mouth daily.    Dispense:  90 tablet    Refill:  1  . losartan (COZAAR) 50 MG tablet    Sig: Take 1 tablet (50 mg total) by mouth daily.    Dispense:  90 tablet    Refill:  1  . amLODipine (NORVASC) 5 MG tablet    Sig: Take 1 tablet (5 mg total) by mouth daily.    Dispense:  90 tablet    Refill:  1  . atorvastatin (LIPITOR) 10 MG tablet    Sig: Take 1 tablet (10 mg total) by mouth daily at 6 (six) AM.    Dispense:  90 tablet    Refill:  1  . pantoprazole (PROTONIX) 40 MG tablet    Sig: Take 1 tablet (40 mg  total) by mouth daily.    Dispense:  90 tablet    Refill:  1      Dr. Otilio Miu [facility]  02/24/18

## 2018-02-24 NOTE — Assessment & Plan Note (Signed)
Chronic Controlled Continue pantoprazole 40 mg daily 

## 2018-02-24 NOTE — Assessment & Plan Note (Signed)
Chronic. Controlled. Will continue losartan 50 mg and amlodipine 5 mg. Will switch to metoprolol 25 mg XL to consolidate to all morning dosing.

## 2018-02-24 NOTE — Assessment & Plan Note (Signed)
Chronic Controlled Continue atorvastatin 10 mg daily.

## 2018-03-10 DIAGNOSIS — H35051 Retinal neovascularization, unspecified, right eye: Secondary | ICD-10-CM | POA: Diagnosis not present

## 2018-03-17 ENCOUNTER — Other Ambulatory Visit: Payer: Self-pay | Admitting: Family Medicine

## 2018-03-18 ENCOUNTER — Other Ambulatory Visit: Payer: Self-pay

## 2018-04-11 ENCOUNTER — Telehealth: Payer: Self-pay

## 2018-04-11 NOTE — Telephone Encounter (Signed)
Patient was on mychart and saw TIA in [place] lists and wants to know why that is what [facility] Dx him with. He was told Vertigo/ Advised that Baxter Flattery is out and he asked that she review by end of day today and I explained we have to treat patients also and Baxter Flattery is out today. I said she will need a few days to review and he said  okay

## 2018-04-15 ENCOUNTER — Encounter: Payer: Self-pay | Admitting: Family Medicine

## 2018-04-28 DIAGNOSIS — H35711 Central serous chorioretinopathy, right eye: Secondary | ICD-10-CM | POA: Diagnosis not present

## 2018-05-06 ENCOUNTER — Ambulatory Visit
Admission: EM | Admit: 2018-05-06 | Discharge: 2018-05-06 | Disposition: A | Discharge status: Home / Self Care | Payer: Medicare Other | Attending: Family Medicine | Admitting: Family Medicine

## 2018-05-06 ENCOUNTER — Encounter: Payer: Self-pay | Admitting: Emergency Medicine

## 2018-05-06 ENCOUNTER — Other Ambulatory Visit: Payer: Self-pay

## 2018-05-06 DIAGNOSIS — L03319 Cellulitis of trunk, unspecified: Secondary | ICD-10-CM | POA: Diagnosis not present

## 2018-05-06 DIAGNOSIS — L02219 Cutaneous abscess of trunk, unspecified: Secondary | ICD-10-CM

## 2018-05-06 MED ORDER — MUPIROCIN 2 % EX OINT: 1.0000 "application " | TOPICAL_OINTMENT | Freq: Three times a day (TID) | CUTANEOUS | 0 refills | Status: DC

## 2018-05-06 MED ORDER — DOXYCYCLINE HYCLATE 100 MG PO CAPS: 100.0000 mg | ORAL_CAPSULE | Freq: Two times a day (BID) | ORAL | 0 refills | Status: DC

## 2018-05-06 NOTE — ED Provider Notes (Signed)
[facility]    CSN: [id number] Arrival date & time: 05/06/18  1447     History   Chief Complaint Chief Complaint  Patient presents with  . Cyst    HPI Juan Moreno is a 67 y.o. male.   HPI  67 year old male presents with a cyst on his chest.  States that it is been bothering him for the last week or so.  Last night he pressed on it had significant drainage on 3 occasions.  Mostl cheesy type material along with some blood.  Had no fever or chills.  Had a sebaceous cyst excised from his neck on a previous visit to our clinic.     Past Medical History:  Diagnosis Date  . GERD (gastroesophageal reflux disease)   . Hyperlipidemia   . Hypertension     Patient Active Problem List   Diagnosis Date Noted  . Vertigo 02/12/2018  . Colon cancer screening 09/17/2016  . Obesity 04/11/2015  . Essential hypertension 03/21/2015  . Hyperlipidemia 03/21/2015  . Gastroesophageal reflux disease with esophagitis 03/21/2015    Past Surgical History:  Procedure Laterality Date  . COLONOSCOPY  2013   cleared for 4 yrs- Dr Vira Agar- Divert  . COLONOSCOPY WITH PROPOFOL N/A 12/26/2016   Procedure: COLONOSCOPY WITH PROPOFOL;  Surgeon: Manya Silvas, MD;  Location: [facility] ENDOSCOPY;  Service: Endoscopy;  Laterality: N/A;  . STAPEDES SURGERY         Home Medications    Prior to Admission medications   Medication Sig Start Date End Date Taking? Authorizing Provider  amLODipine (NORVASC) 5 MG tablet Take 1 tablet (5 mg total) by mouth daily. 02/24/18  Yes Juline Patch, MD  aspirin EC 81 MG EC tablet Take 1 tablet (81 mg total) by mouth daily. 02/13/18  Yes Dustin Flock, MD  atorvastatin (LIPITOR) 10 MG tablet Take 1 tablet (10 mg total) by mouth daily at 6 (six) AM. 02/24/18  Yes Juline Patch, MD  loratadine (CLARITIN) 10 MG tablet Take 10 mg by mouth daily.    Yes [provider]  losartan (COZAAR) 50 MG tablet Take 1 tablet (50 mg total) by mouth  daily. 02/24/18  Yes Juline Patch, MD  metoprolol succinate (TOPROL-XL) 25 MG 24 hr tablet Take 1 tablet (25 mg total) by mouth daily. 02/24/18  Yes Juline Patch, MD  pantoprazole (PROTONIX) 40 MG tablet Take 1 tablet (40 mg total) by mouth daily. 02/24/18  Yes Juline Patch, MD  doxycycline (VIBRAMYCIN) 100 MG capsule Take 1 capsule (100 mg total) by mouth 2 (two) times daily. 05/06/18   Lorin Picket, PA-C  mupirocin ointment (BACTROBAN) 2 % Apply 1 application topically 3 (three) times daily. 05/06/18   Lorin Picket, PA-C    Family History Family History  Problem Relation Age of Onset  . Hypertension Mother   . Hypertension Father     Social History Social History   Tobacco Use  . Smoking status: Never Smoker  . Smokeless tobacco: Former Systems developer    Types: Chew  Substance Use Topics  . Alcohol use: Yes    Alcohol/week: 0.0 standard drinks    Comment: rare  . Drug use: No     Allergies   Penicillins   Review of Systems Review of Systems  Constitutional: Positive for activity change. Negative for chills, fatigue and fever.  Skin: Positive for color change and wound.  All other systems reviewed and are negative.    Physical Exam  Triage Vital Signs ED Triage Vitals  Enc Vitals Group     BP 05/06/18 1455 (!) 177/98     Pulse Rate 05/06/18 1455 100     Resp 05/06/18 1455 18     Temp 05/06/18 1455 98.2 F (36.8 C)     Temp Source 05/06/18 1455 Oral     SpO2 05/06/18 1455 97 %     Weight 05/06/18 1457 250 lb (113.4 kg)     Height 05/06/18 1457 6' (1.829 m)     Head Circumference --      Peak Flow --      Pain Score 05/06/18 1457 3     Pain Loc --      Pain Edu? --      Excl. in [place]? --    No data found.  Updated Vital Signs BP (!) 177/98 (BP Location: Left Arm)   Pulse 100   Temp 98.2 F (36.8 C) (Oral)   Resp 18   Ht 6' (1.829 m)   Wt 250 lb (113.4 kg)   SpO2 97%   BMI 33.91 kg/m   Visual Acuity Right Eye Distance:   Left Eye Distance:     Bilateral Distance:    Right Eye Near:   Left Eye Near:    Bilateral Near:     Physical Exam  Constitutional: He is oriented to person, place, and time. He appears well-developed and well-nourished. No distress.  HENT:  Head: Normocephalic.  Eyes: Pupils are equal, round, and reactive to light. Right eye exhibits no discharge. Left eye exhibits no discharge.  Neck: Normal range of motion.  Pulmonary/Chest: He exhibits tenderness.  Musculoskeletal: Normal range of motion.  Neurological: He is alert and oriented to person, place, and time.  Skin: Skin is warm and dry. He is not diaphoretic.  Area over the left pectoralis just lateral to the lateral sternal border shows an area of erythema that is blanchable with a central punctate area.  It is not pointing at the present time.  It is already been decompressed by the patient.  Gentle squeezing of the area produces only scant serous fluid.  No bleeding is present.  Area of induration extends 1and half centimeters from the punctate lesion.  There is no fluctuance present today.  Psychiatric: He has a normal mood and affect. His behavior is normal. Judgment and thought content normal.  Nursing note and vitals reviewed.    UC Treatments / Results  Labs (all labs ordered are listed, but only abnormal results are displayed) Labs Reviewed - No data to display  EKG None  Radiology No results found.  Procedures Procedures (including critical care time)  Medications Ordered in UC Medications - No data to display  Initial Impression / Assessment and Plan / UC Course  I have reviewed the triage vital signs and the nursing notes.  Pertinent labs & imaging results that were available during my care of the patient were reviewed by me and considered in my medical decision making (see chart for details).     We will have the patient apply warm compresses 3-4 times daily until the abscess is pointing or has resolved.  He will apply  mupirocin ointment to thoroughly drying from the warm compresses.  In addition I will prescribe doxycycline for 5 days.  He will return to our clinic if the area points or is not improving.  Otherwise I would recommend he follow-up with his primary care physician Final Clinical Impressions(s) / UC Diagnoses  Final diagnoses:  Cellulitis and abscess of trunk     Discharge Instructions     Wet a washcloth under the faucet and then place in the microwave oven for 10 to 15 seconds.  Place the cloth over the abscess leaving it there for 10 minutes.  Dry the area thoroughly and apply mupirocin ointment to all areas of hardness.  Perform this 3-4 times each and every day until the abscess has resolved.  Be certain to take all of the doxycycline.  If the area appears worsening return to our clinic or go to your primary care physician   ED Prescriptions    Medication Sig Dispense Auth. Provider   mupirocin ointment (BACTROBAN) 2 % Apply 1 application topically 3 (three) times daily. 22 g Crecencio Mc P, PA-C   doxycycline (VIBRAMYCIN) 100 MG capsule Take 1 capsule (100 mg total) by mouth 2 (two) times daily. 10 capsule Lorin Picket, PA-C     Controlled Substance Prescriptions  Controlled Substance Registry consulted? Not Applicable   Marvis, Saefong, PA-C 05/06/18 1549

## 2018-05-06 NOTE — ED Triage Notes (Signed)
Patient stated he has a cyst on his chest that is painful. He stated he pressed on it last night and had drainage. Denies fever.

## 2018-05-06 NOTE — Discharge Instructions (Signed)
Wet a washcloth under the faucet and then place in the microwave oven for 10 to 15 seconds.  Place the cloth over the abscess leaving it there for 10 minutes.  Dry the area thoroughly and apply mupirocin ointment to all areas of hardness.  Perform this 3-4 times each and every day until the abscess has resolved.  Be certain to take all of the doxycycline.  If the area appears worsening return to our clinic or go to your primary care physician  ° ° °

## 2018-05-08 ENCOUNTER — Ambulatory Visit (INDEPENDENT_AMBULATORY_CARE_PROVIDER_SITE_OTHER): Payer: Medicare Other

## 2018-05-08 DIAGNOSIS — Z23 Encounter for immunization: Secondary | ICD-10-CM | POA: Diagnosis not present

## 2018-05-19 DIAGNOSIS — E782 Mixed hyperlipidemia: Secondary | ICD-10-CM | POA: Diagnosis not present

## 2018-05-19 DIAGNOSIS — I10 Essential (primary) hypertension: Secondary | ICD-10-CM | POA: Diagnosis not present

## 2018-05-27 ENCOUNTER — Other Ambulatory Visit: Payer: Self-pay | Admitting: Family Medicine

## 2018-05-27 DIAGNOSIS — E782 Mixed hyperlipidemia: Secondary | ICD-10-CM

## 2018-05-27 DIAGNOSIS — K2100 Gastro-esophageal reflux disease with esophagitis, without bleeding: Secondary | ICD-10-CM

## 2018-06-03 DIAGNOSIS — H35711 Central serous chorioretinopathy, right eye: Secondary | ICD-10-CM | POA: Diagnosis not present

## 2018-06-03 DIAGNOSIS — H35051 Retinal neovascularization, unspecified, right eye: Secondary | ICD-10-CM | POA: Diagnosis not present

## 2018-06-13 ENCOUNTER — Other Ambulatory Visit: Payer: Self-pay

## 2018-06-15 ENCOUNTER — Other Ambulatory Visit: Payer: Self-pay | Admitting: Family Medicine

## 2018-06-15 DIAGNOSIS — I10 Essential (primary) hypertension: Secondary | ICD-10-CM

## 2018-07-14 DIAGNOSIS — H353211 Exudative age-related macular degeneration, right eye, with active choroidal neovascularization: Secondary | ICD-10-CM | POA: Diagnosis not present

## 2018-08-18 DIAGNOSIS — H353211 Exudative age-related macular degeneration, right eye, with active choroidal neovascularization: Secondary | ICD-10-CM | POA: Diagnosis not present

## 2018-08-26 ENCOUNTER — Ambulatory Visit (INDEPENDENT_AMBULATORY_CARE_PROVIDER_SITE_OTHER): Payer: Medicare Other | Admitting: Family Medicine

## 2018-08-26 ENCOUNTER — Encounter: Payer: Self-pay | Admitting: Family Medicine

## 2018-08-26 VITALS — BP 130/88 | HR 80 | Ht 72.0 in | Wt 260.0 lb

## 2018-08-26 DIAGNOSIS — K2100 Gastro-esophageal reflux disease with esophagitis, without bleeding: Secondary | ICD-10-CM

## 2018-08-26 DIAGNOSIS — E782 Mixed hyperlipidemia: Secondary | ICD-10-CM | POA: Diagnosis not present

## 2018-08-26 DIAGNOSIS — Z6833 Body mass index (BMI) 33.0-33.9, adult: Secondary | ICD-10-CM | POA: Diagnosis not present

## 2018-08-26 DIAGNOSIS — R7303 Prediabetes: Secondary | ICD-10-CM

## 2018-08-26 DIAGNOSIS — I10 Essential (primary) hypertension: Secondary | ICD-10-CM

## 2018-08-26 DIAGNOSIS — E669 Obesity, unspecified: Secondary | ICD-10-CM

## 2018-08-26 MED ORDER — LOSARTAN POTASSIUM 50 MG PO TABS: ORAL_TABLET | ORAL | 1 refills | Status: DC

## 2018-08-26 MED ORDER — PANTOPRAZOLE SODIUM 40 MG PO TBEC: 40.0000 mg | DELAYED_RELEASE_TABLET | Freq: Every day | ORAL | 1 refills | Status: DC

## 2018-08-26 MED ORDER — AMLODIPINE BESYLATE 5 MG PO TABS: 5.0000 mg | ORAL_TABLET | Freq: Every day | ORAL | 1 refills | Status: DC

## 2018-08-26 MED ORDER — METOPROLOL SUCCINATE ER 25 MG PO TB24: 25.0000 mg | ORAL_TABLET | Freq: Every day | ORAL | 1 refills | Status: DC

## 2018-08-26 MED ORDER — ATORVASTATIN CALCIUM 10 MG PO TABS: 10.0000 mg | ORAL_TABLET | Freq: Every day | ORAL | 1 refills | Status: DC

## 2018-08-26 NOTE — Patient Instructions (Signed)

## 2018-08-26 NOTE — Progress Notes (Signed)
Date:  08/26/2018   Name:  Juan Moreno   DOB:  October 27, 1950   MRN:  [id number]   Chief Complaint: Hyperlipidemia; Hypertension; and Gastroesophageal Reflux  Hyperlipidemia  This is a chronic problem. The current episode started more than 1 year ago. The problem is controlled. Recent lipid tests were reviewed and are normal. He has no history of chronic renal disease, diabetes, hypothyroidism, liver disease, obesity or nephrotic syndrome. There are no known factors aggravating his hyperlipidemia. Pertinent negatives include no chest pain, focal sensory loss, focal weakness, leg pain, myalgias or shortness of breath. He is currently on no antihyperlipidemic treatment. The current treatment provides moderate improvement of lipids. There are no compliance problems.  There are no known risk factors for coronary artery disease.  Hypertension  This is a chronic problem. The current episode started more than 1 year ago. The problem is unchanged. The problem is controlled. Pertinent negatives include no anxiety, blurred vision, chest pain, headaches, malaise/fatigue, neck pain, palpitations, peripheral edema, PND, shortness of breath or sweats. Risk factors for coronary artery disease include dyslipidemia, male gender, obesity and post-menopausal state. Past treatments include angiotensin blockers, calcium channel blockers and beta blockers. The current treatment provides moderate improvement. There are no compliance problems.  There is no history of angina, kidney disease, CAD/MI, CVA, heart failure, left ventricular hypertrophy, PVD or retinopathy. There is no history of chronic renal disease, a hypertension causing med or renovascular disease.  Gastroesophageal Reflux  He reports no abdominal pain, no belching, no chest pain, no choking, no coughing, no dysphagia, no early satiety, no globus sensation, no heartburn, no hoarse voice, no nausea, no sore throat, no stridor, no tooth decay, no water  brash or no wheezing. The problem occurs constantly. The problem has been gradually worsening. Nothing aggravates the symptoms.    Review of Systems  Constitutional: Negative for chills, fever and malaise/fatigue.  HENT: Negative for drooling, ear discharge, ear pain, hoarse voice and sore throat.   Eyes: Negative for blurred vision.  Respiratory: Negative for cough, choking, shortness of breath and wheezing.   Cardiovascular: Negative for chest pain, palpitations, leg swelling and PND.  Gastrointestinal: Negative for abdominal pain, blood in stool, constipation, diarrhea, dysphagia, heartburn and nausea.  Endocrine: Negative for polydipsia.  Genitourinary: Negative for dysuria, frequency, hematuria and urgency.  Musculoskeletal: Negative for back pain, myalgias and neck pain.  Skin: Negative for rash.  Allergic/Immunologic: Negative for environmental allergies.  Neurological: Negative for dizziness, focal weakness and headaches.  Hematological: Does not bruise/bleed easily.  Psychiatric/Behavioral: Negative for suicidal ideas. The patient is not nervous/anxious.     Patient Active Problem List   Diagnosis Date Noted  . Vertigo 02/12/2018  . Colon cancer screening 09/17/2016  . Obesity 04/11/2015  . Essential hypertension 03/21/2015  . Hyperlipidemia 03/21/2015  . Gastroesophageal reflux disease with esophagitis 03/21/2015    Allergies  Allergen Reactions  . Penicillins Rash    Past Surgical History:  Procedure Laterality Date  . COLONOSCOPY  2013   cleared for 4 yrs- Dr Vira Agar- Divert  . COLONOSCOPY WITH PROPOFOL N/A 12/26/2016   Procedure: COLONOSCOPY WITH PROPOFOL;  Surgeon: Manya Silvas, MD;  Location: [facility] ENDOSCOPY;  Service: Endoscopy;  Laterality: N/A;  . STAPEDES SURGERY      Social History   Tobacco Use  . Smoking status: Never Smoker  . Smokeless tobacco: Former Systems developer    Types: Chew  Substance Use Topics  . Alcohol use: Yes    Alcohol/week: 0.0  standard drinks    Comment: rare  . Drug use: No     Medication list has been reviewed and updated.  Current Meds  Medication Sig  . amLODipine (NORVASC) 5 MG tablet Take 1 tablet (5 mg total) by mouth daily.  Marland Kitchen aspirin EC 81 MG EC tablet Take 1 tablet (81 mg total) by mouth daily.  Marland Kitchen atorvastatin (LIPITOR) 10 MG tablet Take 1 tablet (10 mg total) by mouth daily at 6 (six) AM.  . loratadine (CLARITIN) 10 MG tablet Take 10 mg by mouth daily. otc  . losartan (COZAAR) 50 MG tablet TAKE 1 TABLET(50 MG) BY MOUTH DAILY  . metoprolol succinate (TOPROL-XL) 25 MG 24 hr tablet Take 1 tablet (25 mg total) by mouth daily.  . pantoprazole (PROTONIX) 40 MG tablet Take 1 tablet (40 mg total) by mouth daily.    PHQ 2/9 Scores 03/20/2017 03/20/2017 09/19/2015 08/29/2015  PHQ - 2 Score 0 0 0 0  PHQ- 9 Score - 0 - -    Physical Exam Vitals signs and nursing note reviewed.  HENT:     Head: Normocephalic.     Right Ear: External ear normal.     Left Ear: External ear normal.     Nose: Nose normal.  Eyes:     General: No scleral icterus.       Right eye: No discharge.        Left eye: No discharge.     Conjunctiva/sclera: Conjunctivae normal.     Pupils: Pupils are equal, round, and reactive to light.  Neck:     Musculoskeletal: Normal range of motion and neck supple.     Thyroid: No thyromegaly.     Vascular: No JVD.     Trachea: No tracheal deviation.  Cardiovascular:     Rate and Rhythm: Normal rate and regular rhythm.     Heart sounds: Normal heart sounds. No murmur. No friction rub. No gallop.   Pulmonary:     Effort: No respiratory distress.     Breath sounds: Normal breath sounds. No wheezing or rales.  Abdominal:     General: Bowel sounds are normal.     Palpations: Abdomen is soft. There is no mass.     Tenderness: There is no abdominal tenderness. There is no guarding or rebound.  Musculoskeletal: Normal range of motion.        General: No tenderness.  Lymphadenopathy:      Cervical: No cervical adenopathy.  Skin:    General: Skin is warm.     Findings: No rash.  Neurological:     Mental Status: He is alert and oriented to person, place, and time.     Cranial Nerves: No cranial nerve deficit.     Deep Tendon Reflexes: Reflexes are normal and symmetric.     BP 130/88   Pulse 80   Ht 6' (1.829 m)   Wt 260 lb (117.9 kg)   BMI 35.26 kg/m   Assessment and Plan: 1. Essential hypertension Chronic. Stable on meds. Refill losartan, amlodipine and metoprolol/ draw renal panel. - Renal Function Panel - losartan (COZAAR) 50 MG tablet; TAKE 1 TABLET(50 MG) BY MOUTH DAILY  Dispense: 90 tablet; Refill: 1 - amLODipine (NORVASC) 5 MG tablet; Take 1 tablet (5 mg total) by mouth daily.  Dispense: 90 tablet; Refill: 1 - metoprolol succinate (TOPROL-XL) 25 MG 24 hr tablet; Take 1 tablet (25 mg total) by mouth daily.  Dispense: 90 tablet; Refill: 1  2. Gastroesophageal reflux disease  with esophagitis Chronic. Controlled. Refill pantoprazole - pantoprazole (PROTONIX) 40 MG tablet; Take 1 tablet (40 mg total) by mouth daily.  Dispense: 90 tablet; Refill: 1  3. Mixed hyperlipidemia Chronic. Controlled. Refill atorvastatin. Draw lipid panel - Lipid panel - atorvastatin (LIPITOR) 10 MG tablet; Take 1 tablet (10 mg total) by mouth daily at 6 (six) AM.  Dispense: 90 tablet; Refill: 1  4. Prediabetes Has had a high reading last July in the hospital. Will recheck A1C to see if medication is needed.  - Hemoglobin A1c  5. Class 1 obesity without serious comorbidity with body mass index (BMI) of 33.0 to 33.9 in adult, unspecified obesity type Weight loss discussed

## 2018-08-27 LAB — LIPID PANEL
CHOL/HDL RATIO: 2.9 ratio (ref 0.0–5.0)
Cholesterol, Total: 119 mg/dL (ref 100–199)
HDL: 41 mg/dL (ref >39)
LDL Calculated: 54 mg/dL (ref 0–99)
TRIGLYCERIDES: 122 mg/dL (ref 0–149)
VLDL Cholesterol Cal: 24 mg/dL (ref 5–40)

## 2018-08-27 LAB — HEMOGLOBIN A1C
Est. average glucose Bld gHb Est-mCnc: 134 mg/dL
HEMOGLOBIN A1C: 6.3 % — AB (ref 4.8–5.6)

## 2018-08-27 LAB — RENAL FUNCTION PANEL
ALBUMIN: 4.6 g/dL (ref 3.8–4.8)
BUN/Creatinine Ratio: 13 (ref 10–24)
BUN: 15 mg/dL (ref 8–27)
CO2: 24 mmol/L (ref 20–29)
CREATININE: 1.19 mg/dL (ref 0.76–1.27)
Calcium: 9.5 mg/dL (ref 8.6–10.2)
Chloride: 97 mmol/L (ref 96–106)
GFR, EST AFRICAN AMERICAN: 72 mL/min/{1.73_m2} (ref >59)
GFR, EST NON AFRICAN AMERICAN: 62 mL/min/{1.73_m2} (ref >59)
GLUCOSE: 101 mg/dL — AB (ref 65–99)
PHOSPHORUS: 3.1 mg/dL (ref 2.8–4.1)
POTASSIUM: 4.5 mmol/L (ref 3.5–5.2)
Sodium: 138 mmol/L (ref 134–144)

## 2018-09-23 DIAGNOSIS — H353211 Exudative age-related macular degeneration, right eye, with active choroidal neovascularization: Secondary | ICD-10-CM | POA: Diagnosis not present

## 2018-12-01 DIAGNOSIS — H353211 Exudative age-related macular degeneration, right eye, with active choroidal neovascularization: Secondary | ICD-10-CM | POA: Diagnosis not present

## 2018-12-11 DIAGNOSIS — L578 Other skin changes due to chronic exposure to nonionizing radiation: Secondary | ICD-10-CM | POA: Diagnosis not present

## 2018-12-11 DIAGNOSIS — Z85828 Personal history of other malignant neoplasm of skin: Secondary | ICD-10-CM | POA: Diagnosis not present

## 2018-12-11 DIAGNOSIS — Z859 Personal history of malignant neoplasm, unspecified: Secondary | ICD-10-CM | POA: Diagnosis not present

## 2018-12-11 DIAGNOSIS — L570 Actinic keratosis: Secondary | ICD-10-CM | POA: Diagnosis not present

## 2018-12-11 DIAGNOSIS — Z872 Personal history of diseases of the skin and subcutaneous tissue: Secondary | ICD-10-CM | POA: Diagnosis not present

## 2019-01-26 DIAGNOSIS — H35711 Central serous chorioretinopathy, right eye: Secondary | ICD-10-CM | POA: Diagnosis not present

## 2019-02-16 ENCOUNTER — Other Ambulatory Visit: Payer: Self-pay

## 2019-02-16 ENCOUNTER — Encounter: Payer: Self-pay | Admitting: Family Medicine

## 2019-02-16 ENCOUNTER — Ambulatory Visit (INDEPENDENT_AMBULATORY_CARE_PROVIDER_SITE_OTHER): Payer: Medicare Other | Admitting: Family Medicine

## 2019-02-16 VITALS — BP 122/88 | HR 72 | Ht 72.0 in | Wt 225.0 lb

## 2019-02-16 DIAGNOSIS — M5412 Radiculopathy, cervical region: Secondary | ICD-10-CM | POA: Diagnosis not present

## 2019-02-16 DIAGNOSIS — K2100 Gastro-esophageal reflux disease with esophagitis, without bleeding: Secondary | ICD-10-CM

## 2019-02-16 DIAGNOSIS — R7303 Prediabetes: Secondary | ICD-10-CM | POA: Diagnosis not present

## 2019-02-16 DIAGNOSIS — I10 Essential (primary) hypertension: Secondary | ICD-10-CM

## 2019-02-16 DIAGNOSIS — E782 Mixed hyperlipidemia: Secondary | ICD-10-CM

## 2019-02-16 MED ORDER — LOSARTAN POTASSIUM 50 MG PO TABS: ORAL_TABLET | ORAL | 1 refills | Status: DC

## 2019-02-16 MED ORDER — AMLODIPINE BESYLATE 5 MG PO TABS: 5.0000 mg | ORAL_TABLET | Freq: Every day | ORAL | 1 refills | Status: DC

## 2019-02-16 MED ORDER — PANTOPRAZOLE SODIUM 40 MG PO TBEC: 40.0000 mg | DELAYED_RELEASE_TABLET | Freq: Every day | ORAL | 1 refills | Status: DC

## 2019-02-16 MED ORDER — METOPROLOL SUCCINATE ER 25 MG PO TB24: 25.0000 mg | ORAL_TABLET | Freq: Every day | ORAL | 1 refills | Status: DC

## 2019-02-16 MED ORDER — ATORVASTATIN CALCIUM 10 MG PO TABS: 10.0000 mg | ORAL_TABLET | Freq: Every day | ORAL | 1 refills | Status: DC

## 2019-02-16 NOTE — Progress Notes (Signed)
Date:  02/16/2019   Name:  Juan Moreno   DOB:  04-Oct-1950   MRN:  [id number]   Chief Complaint: Hypertension, Hyperlipidemia, and Gastroesophageal Reflux  Hypertension This is a chronic problem. The current episode started more than 1 year ago. The problem has been gradually improving since onset. The problem is controlled. Pertinent negatives include no anxiety, blurred vision, chest pain, headaches, malaise/fatigue, neck pain, orthopnea, palpitations, peripheral edema, PND, shortness of breath or sweats. There are no associated agents to hypertension. There are no known risk factors for coronary artery disease. Past treatments include angiotensin blockers, beta blockers and calcium channel blockers. The current treatment provides moderate improvement. There are no compliance problems.  There is no history of angina, kidney disease, CAD/MI, CVA, heart failure, left ventricular hypertrophy, PVD or retinopathy. There is no history of chronic renal disease, a hypertension causing med or renovascular disease.  Hyperlipidemia This is a chronic problem. The current episode started more than 1 year ago. The problem is controlled. Recent lipid tests were reviewed and are normal. He has no history of chronic renal disease, diabetes, hypothyroidism, liver disease, obesity or nephrotic syndrome. Factors aggravating his hyperlipidemia include beta blockers. Associated symptoms include myalgias. Pertinent negatives include no chest pain, focal sensory loss, focal weakness, leg pain or shortness of breath. Current antihyperlipidemic treatment includes statins. The current treatment provides moderate improvement of lipids. There are no compliance problems.  Risk factors for coronary artery disease include dyslipidemia and hypertension.  Gastroesophageal Reflux He complains of heartburn. He reports no abdominal pain, no chest pain, no coughing, no dysphagia, no nausea, no sore throat or no wheezing. This  is a chronic problem. The current episode started more than 1 year ago. The problem occurs frequently. The heartburn is of mild intensity. The symptoms are aggravated by certain foods. Pertinent negatives include no anemia, fatigue, melena, muscle weakness, orthopnea or weight loss. He has tried a PPI for the symptoms. The treatment provided mild relief.  Diabetes He presents for his follow-up (for prediabetes) diabetic visit. He has type 2 diabetes mellitus. His disease course has been stable. Pertinent negatives for hypoglycemia include no dizziness, headaches, nervousness/anxiousness or sweats. Pertinent negatives for diabetes include no blurred vision, no chest pain, no fatigue, no foot paresthesias, no polydipsia, no polyphagia, no polyuria and no weight loss. Symptoms are stable. Pertinent negatives for diabetic complications include no CVA, PVD or retinopathy.  Shoulder Pain  The pain is present in the right shoulder. This is a new problem. The current episode started more than 1 month ago. There has been no history of extremity trauma. The problem occurs intermittently. The pain is mild. Associated symptoms include numbness and tingling. Pertinent negatives include no fever. There is no history of diabetes.    Review of Systems  Constitutional: Negative for chills, fatigue, fever, malaise/fatigue and weight loss.  HENT: Negative for drooling, ear discharge, ear pain and sore throat.   Eyes: Negative for blurred vision.  Respiratory: Negative for cough, shortness of breath and wheezing.   Cardiovascular: Negative for chest pain, palpitations, orthopnea, leg swelling and PND.  Gastrointestinal: Positive for heartburn. Negative for abdominal pain, blood in stool, constipation, diarrhea, dysphagia, melena and nausea.  Endocrine: Negative for polydipsia, polyphagia and polyuria.  Genitourinary: Negative for dysuria, frequency, hematuria and urgency.  Musculoskeletal: Positive for myalgias.  Negative for back pain, muscle weakness and neck pain.  Skin: Negative for rash.  Allergic/Immunologic: Negative for environmental allergies.  Neurological: Positive for tingling and  numbness. Negative for dizziness, focal weakness and headaches.  Hematological: Does not bruise/bleed easily.  Psychiatric/Behavioral: Negative for suicidal ideas. The patient is not nervous/anxious.     Patient Active Problem List   Diagnosis Date Noted  . Vertigo 02/12/2018  . Colon cancer screening 09/17/2016  . Obesity 04/11/2015  . Essential hypertension 03/21/2015  . Hyperlipidemia 03/21/2015  . Gastroesophageal reflux disease with esophagitis 03/21/2015    Allergies  Allergen Reactions  . Penicillins Rash    Past Surgical History:  Procedure Laterality Date  . COLONOSCOPY  2013   cleared for 4 yrs- Dr Vira Agar- Divert  . COLONOSCOPY WITH PROPOFOL N/A 12/26/2016   Procedure: COLONOSCOPY WITH PROPOFOL;  Surgeon: Manya Silvas, MD;  Location: [facility] ENDOSCOPY;  Service: Endoscopy;  Laterality: N/A;  . STAPEDES SURGERY      Social History   Tobacco Use  . Smoking status: Never Smoker  . Smokeless tobacco: Former Systems developer    Types: Chew  Substance Use Topics  . Alcohol use: Yes    Alcohol/week: 0.0 standard drinks    Comment: rare  . Drug use: No     Medication list has been reviewed and updated.  Current Meds  Medication Sig  . amLODipine (NORVASC) 5 MG tablet Take 1 tablet (5 mg total) by mouth daily.  Marland Kitchen aspirin EC 81 MG EC tablet Take 1 tablet (81 mg total) by mouth daily.  Marland Kitchen atorvastatin (LIPITOR) 10 MG tablet Take 1 tablet (10 mg total) by mouth daily at 6 (six) AM.  . loratadine (CLARITIN) 10 MG tablet Take 10 mg by mouth daily. otc  . losartan (COZAAR) 50 MG tablet TAKE 1 TABLET(50 MG) BY MOUTH DAILY  . metoprolol succinate (TOPROL-XL) 25 MG 24 hr tablet Take 1 tablet (25 mg total) by mouth daily.  . pantoprazole (PROTONIX) 40 MG tablet Take 1 tablet (40 mg total) by mouth  daily.    PHQ 2/9 Scores 02/16/2019 03/20/2017 03/20/2017 09/19/2015  PHQ - 2 Score 0 0 0 0  PHQ- 9 Score 0 - 0 -    BP Readings from Last 3 Encounters:  02/16/19 122/88  08/26/18 130/88  05/06/18 (!) 177/98    Physical Exam Vitals signs and nursing note reviewed.  HENT:     Head: Normocephalic.     Right Ear: Tympanic membrane, ear canal and external ear normal.     Left Ear: Tympanic membrane, ear canal and external ear normal.     Nose: Nose normal. No congestion or rhinorrhea.     Mouth/Throat:     Mouth: Mucous membranes are moist.  Eyes:     General: No scleral icterus.       Right eye: No discharge.        Left eye: No discharge.     Conjunctiva/sclera: Conjunctivae normal.     Pupils: Pupils are equal, round, and reactive to light.  Neck:     Musculoskeletal: Normal range of motion and neck supple.     Thyroid: No thyromegaly.     Vascular: No JVD.     Trachea: No tracheal deviation.  Cardiovascular:     Rate and Rhythm: Normal rate and regular rhythm.     Pulses: Normal pulses.     Heart sounds: Normal heart sounds. No murmur. No friction rub. No gallop.   Pulmonary:     Effort: Pulmonary effort is normal. No respiratory distress.     Breath sounds: Normal breath sounds. No wheezing, rhonchi or rales.  Abdominal:  General: Bowel sounds are normal.     Palpations: Abdomen is soft. There is no hepatomegaly, splenomegaly or mass.     Tenderness: There is no abdominal tenderness. There is no guarding or rebound.  Musculoskeletal: Normal range of motion.        General: No tenderness.  Feet:     Right foot:     Protective Sensation: 10 sites tested. 10 sites sensed.     Skin integrity: Skin integrity normal.     Left foot:     Protective Sensation: 10 sites tested. 10 sites sensed.  Lymphadenopathy:     Cervical: No cervical adenopathy.  Skin:    General: Skin is warm.     Findings: No rash.  Neurological:     Mental Status: He is alert and oriented to  person, place, and time.     Cranial Nerves: No cranial nerve deficit.     Deep Tendon Reflexes: Reflexes are normal and symmetric.     Wt Readings from Last 3 Encounters:  02/16/19 225 lb (102.1 kg)  08/26/18 260 lb (117.9 kg)  05/06/18 250 lb (113.4 kg)    BP 122/88   Pulse 72   Ht 6' (1.829 m)   Wt 225 lb (102.1 kg)   BMI 30.52 kg/m   Assessment and Plan: 1. Essential hypertension Chronic.  Controlled.  Continue amlodipine 5 mg once a day losartan 50 mg once a day and metoprolol XL 25 mg once a day.  Will check renal function panel to evaluate for nephrotoxicity. - amLODipine (NORVASC) 5 MG tablet; Take 1 tablet (5 mg total) by mouth daily.  Dispense: 90 tablet; Refill: 1 - losartan (COZAAR) 50 MG tablet; TAKE 1 TABLET(50 MG) BY MOUTH DAILY  Dispense: 90 tablet; Refill: 1 - metoprolol succinate (TOPROL-XL) 25 MG 24 hr tablet; Take 1 tablet (25 mg total) by mouth daily.  Dispense: 90 tablet; Refill: 1 - Renal Function Panel  2. Gastroesophageal reflux disease with esophagitis Chronic.  Controlled.  Continue pantoprazole 40 mg once a day - pantoprazole (PROTONIX) 40 MG tablet; Take 1 tablet (40 mg total) by mouth daily.  Dispense: 90 tablet; Refill: 1  3. Mixed hyperlipidemia Chronic.  Controlled.  Continue atorvastatin 10 mg once a day.  Will check lipid panel. - atorvastatin (LIPITOR) 10 MG tablet; Take 1 tablet (10 mg total) by mouth daily at 6 (six) AM.  Dispense: 90 tablet; Refill: 1 - Lipid panel  4. Prediabetes Patient has prediabetes with blood A1c's in the 6.3 range.  We will check an A1c today given that patient has had control weight loss of about 20 pounds since her last visit and hopefully we are looking at a significant drop. - Hemoglobin A1c  5. Cervical radiculopathy Patient has been having some episodic discomfort in the upper right shoulder area but not associated with movement of the arm believing that this is probably more to some degenerative changes in  the neck and has suggested using some over-the-counter Aleve as needed for discomfort.

## 2019-02-16 NOTE — Patient Instructions (Signed)

## 2019-02-17 LAB — RENAL FUNCTION PANEL
Albumin: 4.7 g/dL (ref 3.8–4.8)
BUN/Creatinine Ratio: 12 (ref 10–24)
BUN: 15 mg/dL (ref 8–27)
CO2: 23 mmol/L (ref 20–29)
Calcium: 9.7 mg/dL (ref 8.6–10.2)
Chloride: 98 mmol/L (ref 96–106)
Creatinine, Ser: 1.23 mg/dL (ref 0.76–1.27)
GFR calc Af Amer: 69 mL/min/{1.73_m2} (ref >59)
GFR calc non Af Amer: 60 mL/min/{1.73_m2} (ref >59)
Glucose: 116 mg/dL — ABNORMAL HIGH (ref 65–99)
Phosphorus: 3.6 mg/dL (ref 2.8–4.1)
Potassium: 4.7 mmol/L (ref 3.5–5.2)
Sodium: 136 mmol/L (ref 134–144)

## 2019-02-17 LAB — LIPID PANEL
Chol/HDL Ratio: 2.6 ratio (ref 0.0–5.0)
Cholesterol, Total: 101 mg/dL (ref 100–199)
HDL: 39 mg/dL — ABNORMAL LOW (ref >39)
LDL Calculated: 40 mg/dL (ref 0–99)
Triglycerides: 111 mg/dL (ref 0–149)
VLDL Cholesterol Cal: 22 mg/dL (ref 5–40)

## 2019-02-17 LAB — HEMOGLOBIN A1C
Est. average glucose Bld gHb Est-mCnc: 128 mg/dL
Hgb A1c MFr Bld: 6.1 % — ABNORMAL HIGH (ref 4.8–5.6)

## 2019-04-01 ENCOUNTER — Ambulatory Visit
Admission: RE | Admit: 2019-04-01 | Discharge: 2019-04-01 | Disposition: A | Discharge status: Home / Self Care | Payer: Medicare Other | Attending: Family Medicine | Admitting: Family Medicine

## 2019-04-01 ENCOUNTER — Encounter: Payer: Self-pay | Admitting: Family Medicine

## 2019-04-01 ENCOUNTER — Other Ambulatory Visit: Payer: Self-pay

## 2019-04-01 ENCOUNTER — Ambulatory Visit (INDEPENDENT_AMBULATORY_CARE_PROVIDER_SITE_OTHER): Payer: Medicare Other | Admitting: Family Medicine

## 2019-04-01 ENCOUNTER — Ambulatory Visit
Admission: RE | Admit: 2019-04-01 | Discharge: 2019-04-01 | Disposition: A | Discharge status: Home / Self Care | Payer: Medicare Other | Source: Ambulatory Visit | Attending: Family Medicine | Admitting: Family Medicine

## 2019-04-01 VITALS — BP 130/92 | HR 84 | Ht 72.0 in | Wt 220.0 lb

## 2019-04-01 DIAGNOSIS — M5020 Other cervical disc displacement, unspecified cervical region: Secondary | ICD-10-CM

## 2019-04-01 DIAGNOSIS — M5412 Radiculopathy, cervical region: Secondary | ICD-10-CM | POA: Diagnosis not present

## 2019-04-01 DIAGNOSIS — Z23 Encounter for immunization: Secondary | ICD-10-CM

## 2019-04-01 DIAGNOSIS — M542 Cervicalgia: Secondary | ICD-10-CM | POA: Diagnosis not present

## 2019-04-01 MED ORDER — MELOXICAM 15 MG PO TABS: 15.0000 mg | ORAL_TABLET | Freq: Every day | ORAL | 0 refills | Status: DC

## 2019-04-01 MED ORDER — CYCLOBENZAPRINE HCL 10 MG PO TABS: 10.0000 mg | ORAL_TABLET | Freq: Three times a day (TID) | ORAL | 0 refills | Status: DC | PRN

## 2019-04-01 MED ORDER — PREDNISONE 10 MG PO TABS: 10.0000 mg | ORAL_TABLET | Freq: Every day | ORAL | 0 refills | Status: DC

## 2019-04-01 NOTE — Progress Notes (Signed)
Date:  04/01/2019   Name:  Juan Moreno   DOB:  Oct 20, 1950   MRN:  [id number]   Chief Complaint: Shoulder Pain (was seen in July for cervical radiculopathy, never really got better. this morning, woke up hurting really bad. has taken 2 ibuprofen this am with no relief and has been doing exercises to try and help with the pain- not helping) and influenza vacc need  Neck Pain  This is a recurrent problem. The current episode started more than 1 month ago (2-3). The problem occurs daily. The problem has been waxing and waning. The pain is associated with nothing. The pain is present in the right side. The quality of the pain is described as aching and burning. The pain is at a severity of 7/10. The pain is moderate. The symptoms are aggravated by position and twisting (driving). The pain is same all the time. Associated symptoms include numbness and tingling. Pertinent negatives include no chest pain, fever, headaches, leg pain, paresis or weakness. He has tried NSAIDs for the symptoms. The treatment provided mild relief.    Review of Systems  Constitutional: Negative for chills and fever.  HENT: Negative for drooling, ear discharge, ear pain and sore throat.   Respiratory: Negative for cough, shortness of breath and wheezing.   Cardiovascular: Negative for chest pain, palpitations and leg swelling.  Gastrointestinal: Negative for abdominal pain, blood in stool, constipation, diarrhea and nausea.  Endocrine: Negative for polydipsia.  Genitourinary: Negative for dysuria, frequency, hematuria and urgency.  Musculoskeletal: Positive for neck pain. Negative for back pain and myalgias.  Skin: Negative for rash.  Allergic/Immunologic: Negative for environmental allergies.  Neurological: Positive for tingling and numbness. Negative for dizziness, weakness and headaches.  Hematological: Does not bruise/bleed easily.  Psychiatric/Behavioral: Negative for suicidal ideas. The patient is not  nervous/anxious.     Patient Active Problem List   Diagnosis Date Noted  . Vertigo 02/12/2018  . Colon cancer screening 09/17/2016  . Obesity 04/11/2015  . Essential hypertension 03/21/2015  . Hyperlipidemia 03/21/2015  . Gastroesophageal reflux disease with esophagitis 03/21/2015    Allergies  Allergen Reactions  . Penicillins Rash    Past Surgical History:  Procedure Laterality Date  . COLONOSCOPY  2013   cleared for 4 yrs- Dr Vira Agar- Divert  . COLONOSCOPY WITH PROPOFOL N/A 12/26/2016   Procedure: COLONOSCOPY WITH PROPOFOL;  Surgeon: Manya Silvas, MD;  Location: [facility] ENDOSCOPY;  Service: Endoscopy;  Laterality: N/A;  . STAPEDES SURGERY      Social History   Tobacco Use  . Smoking status: Never Smoker  . Smokeless tobacco: Former Systems developer    Types: Chew  Substance Use Topics  . Alcohol use: Yes    Alcohol/week: 0.0 standard drinks    Comment: rare  . Drug use: No     Medication list has been reviewed and updated.  Current Meds  Medication Sig  . amLODipine (NORVASC) 5 MG tablet Take 1 tablet (5 mg total) by mouth daily.  Marland Kitchen aspirin EC 81 MG EC tablet Take 1 tablet (81 mg total) by mouth daily.  Marland Kitchen atorvastatin (LIPITOR) 10 MG tablet Take 1 tablet (10 mg total) by mouth daily at 6 (six) AM.  . loratadine (CLARITIN) 10 MG tablet Take 10 mg by mouth daily. otc  . losartan (COZAAR) 50 MG tablet TAKE 1 TABLET(50 MG) BY MOUTH DAILY  . metoprolol succinate (TOPROL-XL) 25 MG 24 hr tablet Take 1 tablet (25 mg total) by mouth daily.  Marland Kitchen  pantoprazole (PROTONIX) 40 MG tablet Take 1 tablet (40 mg total) by mouth daily.    PHQ 2/9 Scores 04/01/2019 02/16/2019 03/20/2017 03/20/2017  PHQ - 2 Score 0 0 0 0  PHQ- 9 Score 0 0 - 0    BP Readings from Last 3 Encounters:  04/01/19 (!) 130/92  02/16/19 122/88  08/26/18 130/88    Physical Exam Vitals signs and nursing note reviewed.  HENT:     Head: Normocephalic.     Right Ear: Tympanic membrane, ear canal and external ear  normal.     Left Ear: Tympanic membrane, ear canal and external ear normal.     Nose: Nose normal. No congestion or rhinorrhea.  Eyes:     General: No scleral icterus.       Right eye: No discharge.        Left eye: No discharge.     Conjunctiva/sclera: Conjunctivae normal.     Pupils: Pupils are equal, round, and reactive to light.  Neck:     Musculoskeletal: Normal range of motion and neck supple.     Thyroid: No thyromegaly.     Vascular: No JVD.     Trachea: No tracheal deviation.  Cardiovascular:     Rate and Rhythm: Normal rate and regular rhythm.     Chest Wall: PMI is not displaced.     Pulses: Normal pulses.     Heart sounds: Normal heart sounds, S1 normal and S2 normal. No murmur. No systolic murmur. No diastolic murmur. No friction rub. No gallop. No S3 or S4 sounds.   Pulmonary:     Effort: No respiratory distress.     Breath sounds: Normal breath sounds. No wheezing or rales.  Abdominal:     General: Bowel sounds are normal.     Palpations: Abdomen is soft. There is no mass.     Tenderness: There is no abdominal tenderness. There is no guarding or rebound.  Musculoskeletal:        General: No tenderness.     Cervical back: He exhibits decreased range of motion and spasm. He exhibits no tenderness and no bony tenderness.  Lymphadenopathy:     Cervical: No cervical adenopathy.  Skin:    General: Skin is warm.     Findings: No rash.  Neurological:     Mental Status: He is alert and oriented to person, place, and time.     Cranial Nerves: No cranial nerve deficit.     Deep Tendon Reflexes: Reflexes are normal and symmetric.     Wt Readings from Last 3 Encounters:  04/01/19 220 lb (99.8 kg)  02/16/19 225 lb (102.1 kg)  08/26/18 260 lb (117.9 kg)    BP (!) 130/92   Pulse 84   Ht 6' (1.829 m)   Wt 220 lb (99.8 kg)   BMI 29.84 kg/m   Assessment and Plan:  1. Cervical herniation Relatively new onset.  Persistent.  Intermittent presentation.  Patient has  had a significant presentation of neck pain radiating to the right shoulder.  We will initiate cyclobenzaprine 10 mg every 8 hours as needed for spasm, prednisone 10 mg once a day, meloxicam 15 mg once a day, and we will obtain a C-spine.  We will also go ahead and refer to orthopedic for evaluation of possible surgery and any other evaluation modalities?  MRI but patient has a stapedectomy with implant which has metal which is negated MRIs in the past. - DG Cervical Spine Complete; Future - cyclobenzaprine (  FLEXERIL) 10 MG tablet; Take 1 tablet (10 mg total) by mouth 3 (three) times daily as needed for muscle spasms.  Dispense: 30 tablet; Refill: 0 - predniSONE (DELTASONE) 10 MG tablet; Take 1 tablet (10 mg total) by mouth daily with breakfast.  Dispense: 30 tablet; Refill: 0 - meloxicam (MOBIC) 15 MG tablet; Take 1 tablet (15 mg total) by mouth daily.  Dispense: 30 tablet; Refill: 0 - Ambulatory referral to Orthopedic Surgery  2. Cervical radiculopathy New onset.  Recurrent.  Current acute exacerbation today.  Medications as prescribed below and will initiate evaluation and treatment with specialty. - DG Cervical Spine Complete; Future - cyclobenzaprine (FLEXERIL) 10 MG tablet; Take 1 tablet (10 mg total) by mouth 3 (three) times daily as needed for muscle spasms.  Dispense: 30 tablet; Refill: 0 - predniSONE (DELTASONE) 10 MG tablet; Take 1 tablet (10 mg total) by mouth daily with breakfast.  Dispense: 30 tablet; Refill: 0 - meloxicam (MOBIC) 15 MG tablet; Take 1 tablet (15 mg total) by mouth daily.  Dispense: 30 tablet; Refill: 0 - Ambulatory referral to Orthopedic Surgery

## 2019-04-01 NOTE — Patient Instructions (Signed)
Cervical Radiculopathy  Cervical radiculopathy happens when a nerve in the neck (a cervical nerve) is pinched or bruised. This condition can happen because of an injury to the cervical spine (vertebrae) in the neck, or as part of the normal aging process. Pressure on the cervical nerves can cause pain or numbness that travels from the neck all the way down into the arm and fingers. Usually, this condition gets better with rest. Treatment may be needed if the condition does not improve. What are the causes? This condition may be caused by:  A neck injury.  A bulging (herniated) disk.  Muscle spasms.  Muscle tightness in the neck because of overuse.  Arthritis.  Breakdown or degeneration in the bones and joints of the spine (spondylosis) due to aging.  Bone spurs that may develop near the cervical nerves. What are the signs or symptoms? Symptoms of this condition include:  Pain. The pain may travel from the neck to the arm and hand. The pain can be severe or irritating. It may be worse when you move your neck.  Numbness or tingling in your arm or hand.  Weakness in the affected arm and hand, in severe cases. How is this diagnosed? This condition may be diagnosed based on your symptoms, your medical history, and a physical exam. You may also have tests, including:  X-rays.  A CT scan.  An MRI.  An electromyogram (EMG).  Nerve conduction tests. How is this treated? In many cases, treatment is not needed for this condition. With rest, the condition usually gets better over time. If treatment is needed, options may include:  Wearing a soft neck collar (cervical collar) for short periods of time, as told by your health care provider.  Doing physical therapy to strengthen your neck muscles.  Taking medicines, such as NSAIDs or oral corticosteroids.  Having spinal injections, in severe cases.  Having surgery. This may be needed if other treatments do not help. Different types  of surgery may be done depending on the cause of this condition. Follow these instructions at home: If you have a cervical collar:  Wear it as told by your health care provider. Remove it only as told by your health care provider.  Ask your health care provider if you can remove the collar for cleaning and bathing. If you are allowed to remove the collar for cleaning or bathing: ? Follow instructions from your health care provider about how to remove the collar safely. ? Clean the collar by wiping it with mild soap and water and drying it completely. ? Take out any removable pads in the collar every 1-2 days, and wash them by hand with soap and water. Let them air-dry completely before you put them back in the collar. ? Check your skin under the collar for irritation or sores. If you see any, tell your health care provider. Managing pain      Take over-the-counter and prescription medicines only as told by your health care provider.  If directed, put ice on the affected area. ? If you have a soft neck collar, remove it as told by your health care provider. ? Put ice in a plastic bag. ? Place a towel between your skin and the bag. ? Leave the ice on for 20 minutes, 2-3 times a day.  If applying ice does not help, you can try using heat. Use the heat source that your health care provider recommends, such as a moist heat pack or a heating pad. ?  Place a towel between your skin and the heat source. ? Leave the heat on for 20-30 minutes. ? Remove the heat if your skin turns bright red. This is especially important if you are unable to feel pain, heat, or cold. You may have a greater risk of getting burned.  Try a gentle neck and shoulder massage to help relieve symptoms. Activity  Rest as needed.  Return to your normal activities as told by your health care provider. Ask your health care provider what activities are safe for you.  Do stretching and strengthening exercises as told by  your health care provider or physical therapist.  Do not lift anything that is heavier than 10 lb (4.5 kg) until your health care provider tells you that it is safe. General instructions  Use a flat pillow when you sleep.  Do not drive while wearing a cervical collar. If you do not have a cervical collar, ask your health care provider if it is safe to drive while your neck heals.  Ask your health care provider if the medicine prescribed to you requires you to avoid driving or using heavy machinery.  Do not use any products that contain nicotine or tobacco, such as cigarettes, e-cigarettes, and chewing tobacco. These can delay healing. If you need help quitting, ask your health care provider.  Keep all follow-up visits as told by your health care provider. This is important. Contact a health care provider if:  Your condition does not improve with treatment. Get help right away if:  Your pain gets much worse and cannot be controlled with medicines.  You have weakness or numbness in your hand, arm, face, or leg.  You have a high fever.  You have a stiff, rigid neck.  You lose control of your bowels or your bladder (have incontinence).  You have trouble with walking, balance, or speaking. Summary  Cervical radiculopathy happens when a nerve in the neck is pinched or bruised.  A nerve can get pinched from a bulging disk, arthritis, muscle spasms, or an injury to the neck.  Symptoms include pain, tingling, or numbness radiating from the neck into the arm or hand. Weakness can also occur in severe cases.  Treatment may include rest, wearing a cervical collar, and physical therapy. Medicines may be prescribed to help with pain. In severe cases, injections or surgery may be needed. This information is not intended to replace advice given to you by your health care provider. Make sure you discuss any questions you have with your health care provider. Document Released: 04/10/2001  Document Revised: 06/06/2018 Document Reviewed: 06/06/2018 [employer] Patient Education  2020 [employer]  

## 2019-04-07 DIAGNOSIS — H353211 Exudative age-related macular degeneration, right eye, with active choroidal neovascularization: Secondary | ICD-10-CM | POA: Diagnosis not present

## 2019-04-07 DIAGNOSIS — H35711 Central serous chorioretinopathy, right eye: Secondary | ICD-10-CM | POA: Diagnosis not present

## 2019-04-08 DIAGNOSIS — M5412 Radiculopathy, cervical region: Secondary | ICD-10-CM | POA: Diagnosis not present

## 2019-04-08 DIAGNOSIS — M5030 Other cervical disc degeneration, unspecified cervical region: Secondary | ICD-10-CM | POA: Diagnosis not present

## 2019-04-14 DIAGNOSIS — M5030 Other cervical disc degeneration, unspecified cervical region: Secondary | ICD-10-CM | POA: Diagnosis not present

## 2019-04-14 DIAGNOSIS — M5412 Radiculopathy, cervical region: Secondary | ICD-10-CM | POA: Diagnosis not present

## 2019-04-14 DIAGNOSIS — M542 Cervicalgia: Secondary | ICD-10-CM | POA: Diagnosis not present

## 2019-04-20 DIAGNOSIS — M5412 Radiculopathy, cervical region: Secondary | ICD-10-CM | POA: Diagnosis not present

## 2019-04-20 DIAGNOSIS — M5030 Other cervical disc degeneration, unspecified cervical region: Secondary | ICD-10-CM | POA: Diagnosis not present

## 2019-04-20 DIAGNOSIS — M542 Cervicalgia: Secondary | ICD-10-CM | POA: Diagnosis not present

## 2019-04-22 DIAGNOSIS — M542 Cervicalgia: Secondary | ICD-10-CM | POA: Diagnosis not present

## 2019-04-22 DIAGNOSIS — M5030 Other cervical disc degeneration, unspecified cervical region: Secondary | ICD-10-CM | POA: Diagnosis not present

## 2019-04-22 DIAGNOSIS — M5412 Radiculopathy, cervical region: Secondary | ICD-10-CM | POA: Diagnosis not present

## 2019-04-29 DIAGNOSIS — M5030 Other cervical disc degeneration, unspecified cervical region: Secondary | ICD-10-CM | POA: Diagnosis not present

## 2019-04-29 DIAGNOSIS — M5412 Radiculopathy, cervical region: Secondary | ICD-10-CM | POA: Diagnosis not present

## 2019-05-05 DIAGNOSIS — M5030 Other cervical disc degeneration, unspecified cervical region: Secondary | ICD-10-CM | POA: Diagnosis not present

## 2019-05-05 DIAGNOSIS — M5412 Radiculopathy, cervical region: Secondary | ICD-10-CM | POA: Diagnosis not present

## 2019-05-05 DIAGNOSIS — M542 Cervicalgia: Secondary | ICD-10-CM | POA: Diagnosis not present

## 2019-05-07 DIAGNOSIS — M5030 Other cervical disc degeneration, unspecified cervical region: Secondary | ICD-10-CM | POA: Diagnosis not present

## 2019-05-07 DIAGNOSIS — M5412 Radiculopathy, cervical region: Secondary | ICD-10-CM | POA: Diagnosis not present

## 2019-05-07 DIAGNOSIS — M542 Cervicalgia: Secondary | ICD-10-CM | POA: Diagnosis not present

## 2019-05-11 DIAGNOSIS — M5412 Radiculopathy, cervical region: Secondary | ICD-10-CM | POA: Diagnosis not present

## 2019-05-11 DIAGNOSIS — M5030 Other cervical disc degeneration, unspecified cervical region: Secondary | ICD-10-CM | POA: Diagnosis not present

## 2019-05-11 DIAGNOSIS — M542 Cervicalgia: Secondary | ICD-10-CM | POA: Diagnosis not present

## 2019-05-13 DIAGNOSIS — M542 Cervicalgia: Secondary | ICD-10-CM | POA: Diagnosis not present

## 2019-05-13 DIAGNOSIS — M5030 Other cervical disc degeneration, unspecified cervical region: Secondary | ICD-10-CM | POA: Diagnosis not present

## 2019-05-13 DIAGNOSIS — M5412 Radiculopathy, cervical region: Secondary | ICD-10-CM | POA: Diagnosis not present

## 2019-05-20 DIAGNOSIS — M542 Cervicalgia: Secondary | ICD-10-CM | POA: Diagnosis not present

## 2019-05-20 DIAGNOSIS — M5030 Other cervical disc degeneration, unspecified cervical region: Secondary | ICD-10-CM | POA: Diagnosis not present

## 2019-05-20 DIAGNOSIS — M5412 Radiculopathy, cervical region: Secondary | ICD-10-CM | POA: Diagnosis not present

## 2019-05-22 DIAGNOSIS — M542 Cervicalgia: Secondary | ICD-10-CM | POA: Diagnosis not present

## 2019-05-22 DIAGNOSIS — M5412 Radiculopathy, cervical region: Secondary | ICD-10-CM | POA: Diagnosis not present

## 2019-05-25 DIAGNOSIS — M5412 Radiculopathy, cervical region: Secondary | ICD-10-CM | POA: Diagnosis not present

## 2019-05-25 DIAGNOSIS — M542 Cervicalgia: Secondary | ICD-10-CM | POA: Diagnosis not present

## 2019-05-27 DIAGNOSIS — M5030 Other cervical disc degeneration, unspecified cervical region: Secondary | ICD-10-CM | POA: Diagnosis not present

## 2019-05-27 DIAGNOSIS — M542 Cervicalgia: Secondary | ICD-10-CM | POA: Diagnosis not present

## 2019-05-27 DIAGNOSIS — M5412 Radiculopathy, cervical region: Secondary | ICD-10-CM | POA: Diagnosis not present

## 2019-06-01 DIAGNOSIS — M542 Cervicalgia: Secondary | ICD-10-CM | POA: Diagnosis not present

## 2019-06-01 DIAGNOSIS — M5412 Radiculopathy, cervical region: Secondary | ICD-10-CM | POA: Diagnosis not present

## 2019-06-01 DIAGNOSIS — M5030 Other cervical disc degeneration, unspecified cervical region: Secondary | ICD-10-CM | POA: Diagnosis not present

## 2019-06-03 DIAGNOSIS — M542 Cervicalgia: Secondary | ICD-10-CM | POA: Diagnosis not present

## 2019-06-03 DIAGNOSIS — M5412 Radiculopathy, cervical region: Secondary | ICD-10-CM | POA: Diagnosis not present

## 2019-06-08 DIAGNOSIS — M5412 Radiculopathy, cervical region: Secondary | ICD-10-CM | POA: Diagnosis not present

## 2019-06-08 DIAGNOSIS — M5030 Other cervical disc degeneration, unspecified cervical region: Secondary | ICD-10-CM | POA: Diagnosis not present

## 2019-06-08 DIAGNOSIS — M542 Cervicalgia: Secondary | ICD-10-CM | POA: Diagnosis not present

## 2019-06-11 DIAGNOSIS — M542 Cervicalgia: Secondary | ICD-10-CM | POA: Diagnosis not present

## 2019-06-11 DIAGNOSIS — M5030 Other cervical disc degeneration, unspecified cervical region: Secondary | ICD-10-CM | POA: Diagnosis not present

## 2019-06-11 DIAGNOSIS — M5412 Radiculopathy, cervical region: Secondary | ICD-10-CM | POA: Diagnosis not present

## 2019-06-15 DIAGNOSIS — M5412 Radiculopathy, cervical region: Secondary | ICD-10-CM | POA: Diagnosis not present

## 2019-06-15 DIAGNOSIS — M5030 Other cervical disc degeneration, unspecified cervical region: Secondary | ICD-10-CM | POA: Diagnosis not present

## 2019-06-15 DIAGNOSIS — M542 Cervicalgia: Secondary | ICD-10-CM | POA: Diagnosis not present

## 2019-06-17 DIAGNOSIS — M5412 Radiculopathy, cervical region: Secondary | ICD-10-CM | POA: Diagnosis not present

## 2019-06-17 DIAGNOSIS — M542 Cervicalgia: Secondary | ICD-10-CM | POA: Diagnosis not present

## 2019-06-18 ENCOUNTER — Other Ambulatory Visit: Payer: Self-pay

## 2019-06-23 DIAGNOSIS — H353211 Exudative age-related macular degeneration, right eye, with active choroidal neovascularization: Secondary | ICD-10-CM | POA: Diagnosis not present

## 2019-06-24 DIAGNOSIS — M542 Cervicalgia: Secondary | ICD-10-CM | POA: Diagnosis not present

## 2019-06-24 DIAGNOSIS — M5412 Radiculopathy, cervical region: Secondary | ICD-10-CM | POA: Diagnosis not present

## 2019-07-01 DIAGNOSIS — M542 Cervicalgia: Secondary | ICD-10-CM | POA: Diagnosis not present

## 2019-07-01 DIAGNOSIS — M5412 Radiculopathy, cervical region: Secondary | ICD-10-CM | POA: Diagnosis not present

## 2019-08-06 ENCOUNTER — Encounter: Payer: Self-pay | Admitting: Family Medicine

## 2019-08-06 ENCOUNTER — Other Ambulatory Visit: Payer: Self-pay

## 2019-08-06 ENCOUNTER — Ambulatory Visit (INDEPENDENT_AMBULATORY_CARE_PROVIDER_SITE_OTHER): Payer: Medicare Other | Admitting: Family Medicine

## 2019-08-06 VITALS — BP 128/80 | HR 80 | Ht 72.0 in | Wt 216.0 lb

## 2019-08-06 DIAGNOSIS — I10 Essential (primary) hypertension: Secondary | ICD-10-CM | POA: Diagnosis not present

## 2019-08-06 DIAGNOSIS — K2100 Gastro-esophageal reflux disease with esophagitis, without bleeding: Secondary | ICD-10-CM | POA: Diagnosis not present

## 2019-08-06 DIAGNOSIS — R7303 Prediabetes: Secondary | ICD-10-CM | POA: Diagnosis not present

## 2019-08-06 DIAGNOSIS — E782 Mixed hyperlipidemia: Secondary | ICD-10-CM | POA: Diagnosis not present

## 2019-08-06 MED ORDER — LOSARTAN POTASSIUM 50 MG PO TABS: ORAL_TABLET | ORAL | 1 refills | Status: DC

## 2019-08-06 MED ORDER — ATORVASTATIN CALCIUM 10 MG PO TABS: 10.0000 mg | ORAL_TABLET | Freq: Every day | ORAL | 1 refills | Status: DC

## 2019-08-06 MED ORDER — METOPROLOL SUCCINATE ER 25 MG PO TB24: 25.0000 mg | ORAL_TABLET | Freq: Every day | ORAL | 1 refills | Status: DC

## 2019-08-06 MED ORDER — AMLODIPINE BESYLATE 5 MG PO TABS: 5.0000 mg | ORAL_TABLET | Freq: Every day | ORAL | 1 refills | Status: DC

## 2019-08-06 MED ORDER — PANTOPRAZOLE SODIUM 40 MG PO TBEC: 40.0000 mg | DELAYED_RELEASE_TABLET | Freq: Every day | ORAL | 1 refills | Status: DC

## 2019-08-06 NOTE — Progress Notes (Signed)
Date:  08/06/2019   Name:  Juan Moreno   DOB:  October 23, 1950   MRN:  [id number]   Chief Complaint: Hypertension, Hyperlipidemia, and Gastroesophageal Reflux  Hypertension This is a chronic problem. The current episode started more than 1 year ago. The problem has been gradually improving since onset. The problem is controlled. Pertinent negatives include no anxiety, blurred vision, chest pain, headaches, malaise/fatigue, neck pain, orthopnea, palpitations, peripheral edema, PND, shortness of breath or sweats. There are no associated agents to hypertension. Risk factors for coronary artery disease include dyslipidemia and diabetes mellitus. Past treatments include angiotensin blockers, calcium channel blockers and beta blockers. The current treatment provides moderate improvement. There are no compliance problems.  There is no history of angina, kidney disease, CAD/MI, CVA, heart failure, left ventricular hypertrophy, PVD or retinopathy. There is no history of chronic renal disease, a hypertension causing med or renovascular disease.  Hyperlipidemia This is a chronic problem. The current episode started more than 1 year ago. The problem is controlled. Recent lipid tests were reviewed and are normal. Exacerbating diseases include diabetes. He has no history of chronic renal disease, hypothyroidism, liver disease, obesity or nephrotic syndrome. There are no known factors aggravating his hyperlipidemia. Pertinent negatives include no chest pain, focal sensory loss, focal weakness, leg pain, myalgias or shortness of breath. Current antihyperlipidemic treatment includes statins. The current treatment provides moderate improvement of lipids. There are no compliance problems.  Risk factors for coronary artery disease include diabetes mellitus and hypertension.  Gastroesophageal Reflux He reports no abdominal pain, no belching, no chest pain, no choking, no coughing, no dysphagia, no early satiety, no  globus sensation, no heartburn, no hoarse voice, no nausea, no sore throat, no stridor, no tooth decay, no water brash or no wheezing. This is a chronic problem. The current episode started more than 1 year ago. The problem occurs occasionally. The symptoms are aggravated by certain foods. Pertinent negatives include no anemia, fatigue, melena, muscle weakness, orthopnea or weight loss. He has tried a PPI for the symptoms. The treatment provided mild relief.  Diabetes Pertinent negatives for hypoglycemia include no dizziness, headaches, nervousness/anxiousness or sweats. Pertinent negatives for diabetes include no blurred vision, no chest pain, no fatigue, no polydipsia and no weight loss. Pertinent negatives for diabetic complications include no CVA, PVD or retinopathy. He is following a generally healthy (weight loss 35 pounds over last year) diet. Meal planning includes avoidance of concentrated sweets and carbohydrate counting. An ACE inhibitor/angiotensin II receptor blocker is being taken.    Lab Results  Component Value Date   CREATININE 1.23 02/16/2019   BUN 15 02/16/2019   NA 136 02/16/2019   K 4.7 02/16/2019   CL 98 02/16/2019   CO2 23 02/16/2019   Lab Results  Component Value Date   CHOL 101 02/16/2019   HDL 39 (L) 02/16/2019   LDLCALC 40 02/16/2019   TRIG 111 02/16/2019   CHOLHDL 2.6 02/16/2019   Lab Results  Component Value Date   TSH 1.943 02/12/2018   Lab Results  Component Value Date   HGBA1C 6.1 (H) 02/16/2019     Review of Systems  Constitutional: Negative for chills, fatigue, fever, malaise/fatigue and weight loss.  HENT: Negative for drooling, ear discharge, ear pain, hoarse voice, postnasal drip, rhinorrhea and sore throat.   Eyes: Negative for blurred vision.  Respiratory: Negative for cough, choking, shortness of breath and wheezing.   Cardiovascular: Negative for chest pain, palpitations, orthopnea, leg swelling and PND.  Gastrointestinal: Negative for  abdominal pain, blood in stool, constipation, diarrhea, dysphagia, heartburn, melena and nausea.  Endocrine: Negative for polydipsia.  Genitourinary: Negative for dysuria, frequency, hematuria and urgency.  Musculoskeletal: Negative for back pain, myalgias, muscle weakness and neck pain.  Skin: Negative for rash.  Allergic/Immunologic: Negative for environmental allergies.  Neurological: Negative for dizziness, focal weakness and headaches.  Hematological: Does not bruise/bleed easily.  Psychiatric/Behavioral: Negative for suicidal ideas. The patient is not nervous/anxious.     Patient Active Problem List   Diagnosis Date Noted  . Vertigo 02/12/2018  . Colon cancer screening 09/17/2016  . Obesity 04/11/2015  . Essential hypertension 03/21/2015  . Hyperlipidemia 03/21/2015  . Gastroesophageal reflux disease with esophagitis 03/21/2015    Allergies  Allergen Reactions  . Penicillins Rash    Past Surgical History:  Procedure Laterality Date  . COLONOSCOPY  2013   cleared for 4 yrs- Dr Vira Agar- Divert  . COLONOSCOPY WITH PROPOFOL N/A 12/26/2016   Procedure: COLONOSCOPY WITH PROPOFOL;  Surgeon: Manya Silvas, MD;  Location: [facility] ENDOSCOPY;  Service: Endoscopy;  Laterality: N/A;  . STAPEDES SURGERY      Social History   Tobacco Use  . Smoking status: Never Smoker  . Smokeless tobacco: Former Systems developer    Types: Chew  Substance Use Topics  . Alcohol use: Yes    Alcohol/week: 0.0 standard drinks    Comment: rare  . Drug use: No     Medication list has been reviewed and updated.  Current Meds  Medication Sig  . amLODipine (NORVASC) 5 MG tablet Take 1 tablet (5 mg total) by mouth daily.  Marland Kitchen aspirin EC 81 MG EC tablet Take 1 tablet (81 mg total) by mouth daily.  Marland Kitchen atorvastatin (LIPITOR) 10 MG tablet Take 1 tablet (10 mg total) by mouth daily at 6 (six) AM.  . losartan (COZAAR) 50 MG tablet TAKE 1 TABLET(50 MG) BY MOUTH DAILY  . metoprolol succinate (TOPROL-XL) 25 MG 24 hr  tablet Take 1 tablet (25 mg total) by mouth daily.  . pantoprazole (PROTONIX) 40 MG tablet Take 1 tablet (40 mg total) by mouth daily.  . [DISCONTINUED] cyclobenzaprine (FLEXERIL) 10 MG tablet Take 1 tablet (10 mg total) by mouth 3 (three) times daily as needed for muscle spasms.    PHQ 2/9 Scores 08/06/2019 04/01/2019 02/16/2019 03/20/2017  PHQ - 2 Score 0 0 0 0  PHQ- 9 Score 0 0 0 -    BP Readings from Last 3 Encounters:  08/06/19 128/80  04/01/19 (!) 130/92  02/16/19 122/88    Physical Exam Vitals and nursing note reviewed.  HENT:     Head: Normocephalic.     Right Ear: External ear normal.     Left Ear: External ear normal.     Nose: Nose normal.  Eyes:     General: No scleral icterus.       Right eye: No discharge.        Left eye: No discharge.     Conjunctiva/sclera: Conjunctivae normal.     Pupils: Pupils are equal, round, and reactive to light.  Neck:     Thyroid: No thyromegaly.     Vascular: No JVD.     Trachea: No tracheal deviation.  Cardiovascular:     Rate and Rhythm: Normal rate and regular rhythm.     Heart sounds: Normal heart sounds. No murmur. No friction rub. No gallop.   Pulmonary:     Effort: No respiratory distress.     Breath  sounds: Normal breath sounds. No wheezing or rales.  Abdominal:     General: Bowel sounds are normal.     Palpations: Abdomen is soft. There is no mass.     Tenderness: There is no abdominal tenderness. There is no guarding or rebound.  Musculoskeletal:        General: No tenderness. Normal range of motion.     Cervical back: Normal range of motion and neck supple.  Lymphadenopathy:     Cervical: No cervical adenopathy.  Skin:    General: Skin is warm.     Findings: No rash.  Neurological:     Mental Status: He is alert and oriented to person, place, and time.     Cranial Nerves: No cranial nerve deficit.     Deep Tendon Reflexes: Reflexes are normal and symmetric.     Wt Readings from Last 3 Encounters:  08/06/19 216  lb (98 kg)  04/01/19 220 lb (99.8 kg)  02/16/19 225 lb (102.1 kg)    BP 128/80   Pulse 80   Ht 6' (1.829 m)   Wt 216 lb (98 kg)   BMI 29.29 kg/m   Assessment and Plan:   1. Essential hypertension Chronic.  Controlled.  Stable.  Continue losartan 50 mg once a day Toprol-XL 25 mg once a day and amlodipine 5 mg once a day.  We will check a CMP today.  Reevaluate in 6 months. - Comprehensive Metabolic Panel (CMET) - losartan (COZAAR) 50 MG tablet; TAKE 1 TABLET(50 MG) BY MOUTH DAILY  Dispense: 90 tablet; Refill: 1 - metoprolol succinate (TOPROL-XL) 25 MG 24 hr tablet; Take 1 tablet (25 mg total) by mouth daily.  Dispense: 90 tablet; Refill: 1 - amLODipine (NORVASC) 5 MG tablet; Take 1 tablet (5 mg total) by mouth daily.  Dispense: 90 tablet; Refill: 1  2. Gastroesophageal reflux disease with esophagitis Chronic.  Controlled.  Stable.  Patient is controlled on Protonix 40 mg once a day.  We will continue this and monitor in 6 months - pantoprazole (PROTONIX) 40 MG tablet; Take 1 tablet (40 mg total) by mouth daily.  Dispense: 90 tablet; Refill: 1  3. Mixed hyperlipidemia Chronic.  Controlled.  Stable.  Continue atorvastatin 10 mg once a day. - atorvastatin (LIPITOR) 10 MG tablet; Take 1 tablet (10 mg total) by mouth daily at 6 (six) AM.  Dispense: 90 tablet; Refill: 1  4. Prediabetes Chronic.  Controlled.  Stable.  Last A1c in the 6.1 range.  We will continue to control with diet.  We will check A1c since patient has had control weight loss over the past 6 months of 40 pounds. - Comprehensive Metabolic Panel (CMET) - Hemoglobin A1c

## 2019-08-07 LAB — COMPREHENSIVE METABOLIC PANEL
ALT: 23 IU/L (ref 0–44)
AST: 26 IU/L (ref 0–40)
Albumin/Globulin Ratio: 1.5 (ref 1.2–2.2)
Albumin: 4.5 g/dL (ref 3.8–4.8)
Alkaline Phosphatase: 101 IU/L (ref 39–117)
BUN/Creatinine Ratio: 13 (ref 10–24)
BUN: 17 mg/dL (ref 8–27)
Bilirubin Total: 2.1 mg/dL — ABNORMAL HIGH (ref 0.0–1.2)
CO2: 24 mmol/L (ref 20–29)
Calcium: 9.7 mg/dL (ref 8.6–10.2)
Chloride: 99 mmol/L (ref 96–106)
Creatinine, Ser: 1.3 mg/dL — ABNORMAL HIGH (ref 0.76–1.27)
GFR calc Af Amer: 64 mL/min/{1.73_m2} (ref >59)
GFR calc non Af Amer: 55 mL/min/{1.73_m2} — ABNORMAL LOW (ref >59)
Globulin, Total: 3 g/dL (ref 1.5–4.5)
Glucose: 97 mg/dL (ref 65–99)
Potassium: 4.9 mmol/L (ref 3.5–5.2)
Sodium: 137 mmol/L (ref 134–144)
Total Protein: 7.5 g/dL (ref 6.0–8.5)

## 2019-08-07 LAB — HEMOGLOBIN A1C
Est. average glucose Bld gHb Est-mCnc: 120 mg/dL
Hgb A1c MFr Bld: 5.8 % — ABNORMAL HIGH (ref 4.8–5.6)

## 2019-08-09 LAB — HEPATIC FUNCTION PANEL: Bilirubin, Direct: 0.33

## 2019-08-12 ENCOUNTER — Other Ambulatory Visit: Payer: Self-pay

## 2019-08-12 LAB — BILIRUBIN, DIRECT: Bilirubin, Direct: 0.33 mg/dL (ref 0.00–0.40)

## 2019-08-12 LAB — SPECIMEN STATUS REPORT

## 2019-08-12 NOTE — Progress Notes (Signed)
Entered Bilirubin result

## 2019-09-01 DIAGNOSIS — H353211 Exudative age-related macular degeneration, right eye, with active choroidal neovascularization: Secondary | ICD-10-CM | POA: Diagnosis not present

## 2019-09-02 ENCOUNTER — Other Ambulatory Visit: Payer: Self-pay

## 2019-09-02 ENCOUNTER — Ambulatory Visit (INDEPENDENT_AMBULATORY_CARE_PROVIDER_SITE_OTHER): Payer: Medicare Other

## 2019-09-02 VITALS — BP 121/77 | Ht 72.0 in | Wt 217.0 lb

## 2019-09-02 DIAGNOSIS — Z0000 Encounter for general adult medical examination without abnormal findings: Secondary | ICD-10-CM | POA: Diagnosis not present

## 2019-09-02 NOTE — Progress Notes (Signed)
Subjective:   Tony Schubring is a 69 y.o. male who presents for an Initial Medicare Annual Wellness Visit.  Virtual Visit via Telephone Note  I connected with Michel Santee on 09/02/19 at  9:20 AM EST by telephone and verified that I am speaking with the correct person using two identifiers.  Medicare Annual Wellness visit completed telephonically due to Covid-19 pandemic.   Location: Patient: home Provider: office   I discussed the limitations, risks, security and privacy concerns of performing an evaluation and management service by telephone and the availability of in person appointments. The patient expressed understanding and agreed to proceed.  Some vital signs may be absent or patient reported.   Clemetine Marker, LPN    Review of Systems   Cardiac Risk Factors include: advanced age (>20men, >24 women);dyslipidemia;male gender;hypertension    Objective:    Today's Vitals   09/02/19 0929  BP: 121/77  Weight: 217 lb (98.4 kg)  Height: 6' (1.829 m)   Body mass index is 29.43 kg/m.  Advanced Directives 09/02/2019 05/06/2018 02/12/2018 08/09/2017 12/26/2016 03/21/2015  Does Patient Have a Medical Advance Directive? No No No No No No  Would patient like information on creating a medical advance directive? Yes (MAU/Ambulatory/Procedural Areas - Information given) - Yes (Inpatient - patient requests chaplain consult to create a medical advance directive) - No - Patient declined -    Current Medications (verified) Outpatient Encounter Medications as of 09/02/2019  Medication Sig  . amLODipine (NORVASC) 5 MG tablet Take 1 tablet (5 mg total) by mouth daily.  Marland Kitchen aspirin EC 81 MG EC tablet Take 1 tablet (81 mg total) by mouth daily.  Marland Kitchen atorvastatin (LIPITOR) 10 MG tablet Take 1 tablet (10 mg total) by mouth daily at 6 (six) AM.  . loratadine (CLARITIN) 10 MG tablet Take 10 mg by mouth daily. otc  . losartan (COZAAR) 50 MG tablet TAKE 1 TABLET(50 MG) BY MOUTH DAILY    . metoprolol succinate (TOPROL-XL) 25 MG 24 hr tablet Take 1 tablet (25 mg total) by mouth daily.  . pantoprazole (PROTONIX) 40 MG tablet Take 1 tablet (40 mg total) by mouth daily.   No facility-administered encounter medications on file as of 09/02/2019.    Allergies (verified) Penicillins   History: Past Medical History:  Diagnosis Date  . GERD (gastroesophageal reflux disease)   . Hyperlipidemia   . Hypertension   . Retinopathy of right eye    Past Surgical History:  Procedure Laterality Date  . COLONOSCOPY  2013   cleared for 4 yrs- Dr Vira Agar- Divert  . COLONOSCOPY WITH PROPOFOL N/A 12/26/2016   Procedure: COLONOSCOPY WITH PROPOFOL;  Surgeon: Manya Silvas, MD;  Location: [facility] ENDOSCOPY;  Service: Endoscopy;  Laterality: N/A;  . STAPEDES SURGERY     Family History  Problem Relation Age of Onset  . Hypertension Mother   . Hypertension Father    Social History   Socioeconomic History  . Marital status: Widowed    Spouse name: Not on file  . Number of children: 0  . Years of education: Not on file  . Highest education level: Not on file  Occupational History  . Not on file  Tobacco Use  . Smoking status: Never Smoker  . Smokeless tobacco: Former Systems developer    Types: Chew  Substance and Sexual Activity  . Alcohol use: Yes    Alcohol/week: 0.0 standard drinks    Comment: rare  . Drug use: No  . Sexual activity: Never  Other Topics Concern  . Not on file  Social History Narrative  . Not on file   Social Determinants of Health   Financial Resource Strain: Low Risk   . Difficulty of Paying Living Expenses: Not hard at all  Food Insecurity: No Food Insecurity  . Worried About Charity fundraiser in the Last Year: Never true  . Ran Out of Food in the Last Year: Never true  Transportation Needs: No Transportation Needs  . Lack of Transportation (Medical): No  . Lack of Transportation (Non-Medical): No  Physical Activity: Sufficiently Active  . Days of  Exercise per Week: 7 days  . Minutes of Exercise per Session: 30 min  Stress: No Stress Concern Present  . Feeling of Stress : Not at all  Social Connections: Somewhat Isolated  . Frequency of Communication with Friends and Family: More than three times a week  . Frequency of Social Gatherings with Friends and Family: More than three times a week  . Attends Religious Services: More than 4 times per year  . Active Member of Clubs or Organizations: No  . Attends Archivist Meetings: Never  . Marital Status: Widowed   Tobacco Counseling Counseling given: Not Answered   Clinical Intake:  Pre-visit preparation completed: Yes  Pain : No/denies pain     BMI - recorded: 29.43 Nutritional Status: BMI 25 -29 Overweight Nutritional Risks: None Diabetes: No  How often do you need to have someone help you when you read instructions, pamphlets, or other written materials from your doctor or pharmacy?: 1 - Never  Interpreter Needed?: No  Information entered by :: Clemetine Marker LPN  Activities of Daily Living In your present state of health, do you have any difficulty performing the following activities: 09/02/2019  Hearing? N  Comment declines hearing aids  Vision? Y  Difficulty concentrating or making decisions? N  Walking or climbing stairs? N  Dressing or bathing? N  Doing errands, shopping? N  Preparing Food and eating ? N  Using the Toilet? N  In the past six months, have you accidently leaked urine? N  Do you have problems with loss of bowel control? N  Managing your Medications? N  Managing your Finances? N  Housekeeping or managing your Housekeeping? N  Some recent data might be hidden     Immunizations and Health Maintenance Immunization History  Administered Date(s) Administered  . Fluad Quad(high Dose 65+) 04/01/2019  . Influenza, High Dose Seasonal PF 05/08/2018  . Influenza-Unspecified 04/29/2017  . PFIZER SARS-COV-2 Vaccination 08/21/2019  .  Pneumococcal Conjugate-13 09/19/2015  . Pneumococcal Polysaccharide-23 03/20/2017  . Tdap 09/19/2015   There are no preventive care reminders to display for this patient.  Patient Care Team: Juline Patch, MD as PCP - General (Family Medicine)  Indicate any recent Medical Services you may have received from other than [facility] providers in the past year (date may be approximate).    Assessment:   This is a routine wellness examination for Gwyndolyn Saxon.  Hearing/Vision screen  Hearing Screening   125Hz  250Hz  500Hz  1000Hz  2000Hz  3000Hz  4000Hz  6000Hz  8000Hz   Right ear:           Left ear:           Comments: Pt wears hearing aids maintained by [employer] hearing aids   Vision Screening Comments: Annual vision screenings done at [place] issues and exercise activities discussed: Current Exercise Habits: Home exercise routine, Type of exercise: walking, Time (Minutes): 30,  Frequency (Times/Week): 7, Weekly Exercise (Minutes/Week): 210, Intensity: Moderate, Exercise limited by: None identified  Goals   None    Depression Screen PHQ 2/9 Scores 09/02/2019 08/06/2019 04/01/2019 02/16/2019  PHQ - 2 Score 0 0 0 0  PHQ- 9 Score - 0 0 0    Fall Risk Fall Risk  09/02/2019 08/06/2019 06/18/2019 06/13/2018 03/20/2017  Falls in the past year? 0 0 0 0 No  Comment - - [facility] Telephone Survey: data to providers prior to load [employer] Telephone Survey: data to providers prior to load -  Number falls in past yr: 0 - - - -  Injury with Fall? 0 - - - -  Risk for fall due to : No Fall Risks - - - -  Follow up Falls prevention discussed Falls evaluation completed - - -    FALL RISK PREVENTION PERTAINING TO THE HOME:  Any stairs in or around the home? Yes  If so, do they handrails? No  2 steps outside  Home free of loose throw rugs in walkways, pet beds, electrical cords, etc? Yes  Adequate lighting in your home to reduce risk of falls? Yes   ASSISTIVE DEVICES UTILIZED TO PREVENT FALLS:  Life  alert? No  Use of a cane, walker or w/c? No  Grab bars in the bathroom? No  Shower chair or bench in shower? Yes  Elevated toilet seat or a handicapped toilet? No   DME ORDERS:  DME order needed?  No   TIMED UP AND GO:  Was the test performed? No . Telephonic visit.   Education: Fall risk prevention has been discussed.  Intervention(s) required? No   Cognitive Function:     6CIT Screen 09/02/2019  What Year? 0 points  What month? 0 points  What time? 0 points  Count back from 20 0 points  Months in reverse 0 points  Repeat phrase 0 points  Total Score 0    Screening Tests Health Maintenance  Topic Date Due  . TETANUS/TDAP  09/18/2025  . COLONOSCOPY  12/27/2026  . INFLUENZA VACCINE  Completed  . Hepatitis C Screening  Completed  . PNA vac Low Risk Adult  Completed    Qualifies for Shingles Vaccine? Yes  Shingrix vaccine completed per patient. Contacted [place] and they do not have record of it.   Tdap: Up to date  Flu Vaccine: Up to date  Pneumococcal Vaccine: Up to date  Cancer Screenings:  Colorectal Screening: Completed 12/26/16. Repeat every 10 years;   Lung Cancer Screening: (Low Dose CT Chest recommended if Age 31-80 years, 30 pack-year currently smoking OR have quit w/in 15years.) does not qualify.    Additional Screening:  Hepatitis C Screening: does qualify; Completed 09/17/16.   Vision Screening: Recommended annual ophthalmology exams for early detection of glaucoma and other disorders of the eye. Is the patient up to date with their annual eye exam?  Yes  Who is the provider or what is the name of the office in which the pt attends annual eye exams? [place] Screening: Recommended annual dental exams for proper oral hygiene  Community Resource Referral:  CRR required this visit?  No       Plan:    I have personally reviewed and addressed the Medicare Annual Wellness questionnaire and have noted the following  in the patient's chart:  A. Medical and social history B. Use of alcohol, tobacco or illicit drugs  C. Current medications and supplements D. Functional ability and  status E.  Nutritional status F.  Physical activity G. Advance directives H. List of other physicians I.  Hospitalizations, surgeries, and ER visits in previous 12 months J.  [place] such as hearing and vision if needed, cognitive and depression L. Referrals and appointments   In addition, I have reviewed and discussed with patient certain preventive protocols, quality metrics, and best practice recommendations. A written personalized care plan for preventive services as well as general preventive health recommendations were provided to patient.   Signed,  Clemetine Marker, LPN Nurse Health Advisor    Nurse Notes: none

## 2019-09-02 NOTE — Patient Instructions (Signed)
[name] , Thank you for taking time to come for your Medicare Wellness Visit. I appreciate your ongoing commitment to your health goals. Please review the following plan we discussed and let me know if I can assist you in the future.   Screening recommendations/referrals: Colonoscopy: done 12/26/16 Recommended yearly ophthalmology/optometry visit for glaucoma screening and checkup Recommended yearly dental visit for hygiene and checkup  Vaccinations: Influenza vaccine: done 04/01/19 Pneumococcal vaccine: done 03/20/17 Tdap vaccine: done 09/19/15 Shingles vaccine: Completed; need vaccine date    Advanced directives: Advance directive discussed with you today. I have provided a copy for you to complete at home and have notarized. Once this is complete please bring a copy in to our office so we can scan it into your chart.  Conditions/risks identified: Keep up the great work!  Next appointment: Please follow up in one year for your Medicare Annual Wellness visit.    Preventive Care 44 Years and Older, Male Preventive care refers to lifestyle choices and visits with your health care provider that can promote health and wellness. What does preventive care include?  A yearly physical exam. This is also called an annual well check.  Dental exams once or twice a year.  Routine eye exams. Ask your health care provider how often you should have your eyes checked.  Personal lifestyle choices, including:  Daily care of your teeth and gums.  Regular physical activity.  Eating a healthy diet.  Avoiding tobacco and drug use.  Limiting alcohol use.  Practicing safe sex.  Taking low doses of aspirin every day.  Taking vitamin and mineral supplements as recommended by your health care provider. What happens during an annual well check? The services and screenings done by your health care provider during your annual well check will depend on your age, overall health, lifestyle risk  factors, and family history of disease. Counseling  Your health care provider may ask you questions about your:  Alcohol use.  Tobacco use.  Drug use.  Emotional well-being.  Home and relationship well-being.  Sexual activity.  Eating habits.  History of falls.  Memory and ability to understand (cognition).  Work and work Statistician. Screening  You may have the following tests or measurements:  Height, weight, and BMI.  Blood pressure.  Lipid and cholesterol levels. These may be checked every 5 years, or more frequently if you are over 36 years old.  Skin check.  Lung cancer screening. You may have this screening every year starting at age 43 if you have a 30-pack-year history of smoking and currently smoke or have quit within the past 15 years.  Fecal occult blood test (FOBT) of the stool. You may have this test every year starting at age 62.  Flexible sigmoidoscopy or colonoscopy. You may have a sigmoidoscopy every 5 years or a colonoscopy every 10 years starting at age 26.  Prostate cancer screening. Recommendations will vary depending on your family history and other risks.  Hepatitis C blood test.  Hepatitis B blood test.  Sexually transmitted disease (STD) testing.  Diabetes screening. This is done by checking your blood sugar (glucose) after you have not eaten for a while (fasting). You may have this done every 1-3 years.  Abdominal aortic aneurysm (AAA) screening. You may need this if you are a current or former smoker.  Osteoporosis. You may be screened starting at age 7 if you are at high risk. Talk with your health care provider about your test results, treatment options, and  if necessary, the need for more tests. Vaccines  Your health care provider may recommend certain vaccines, such as:  Influenza vaccine. This is recommended every year.  Tetanus, diphtheria, and acellular pertussis (Tdap, Td) vaccine. You may need a Td booster every 10  years.  Zoster vaccine. You may need this after age 52.  Pneumococcal 13-valent conjugate (PCV13) vaccine. One dose is recommended after age 82.  Pneumococcal polysaccharide (PPSV23) vaccine. One dose is recommended after age 40. Talk to your health care provider about which screenings and vaccines you need and how often you need them. This information is not intended to replace advice given to you by your health care provider. Make sure you discuss any questions you have with your health care provider. Document Released: 08/12/2015 Document Revised: 04/04/2016 Document Reviewed: 05/17/2015 Elsevier Interactive Patient Education  2017 [place] Prevention in the Home Falls can cause injuries. They can happen to people of all ages. There are many things you can do to make your home safe and to help prevent falls. What can I do on the outside of my home?  Regularly fix the edges of walkways and driveways and fix any cracks.  Remove anything that might make you trip as you walk through a door, such as a raised step or threshold.  Trim any bushes or trees on the path to your home.  Use bright outdoor lighting.  Clear any walking paths of anything that might make someone trip, such as rocks or tools.  Regularly check to see if handrails are loose or broken. Make sure that both sides of any steps have handrails.  Any raised decks and porches should have guardrails on the edges.  Have any leaves, snow, or ice cleared regularly.  Use sand or salt on walking paths during winter.  Clean up any spills in your garage right away. This includes oil or grease spills. What can I do in the bathroom?  Use night lights.  Install grab bars by the toilet and in the tub and shower. Do not use towel bars as grab bars.  Use non-skid mats or decals in the tub or shower.  If you need to sit down in the shower, use a plastic, non-slip stool.  Keep the floor dry. Clean up any water that  spills on the floor as soon as it happens.  Remove soap buildup in the tub or shower regularly.  Attach bath mats securely with double-sided non-slip rug tape.  Do not have throw rugs and other things on the floor that can make you trip. What can I do in the bedroom?  Use night lights.  Make sure that you have a light by your bed that is easy to reach.  Do not use any sheets or blankets that are too big for your bed. They should not hang down onto the floor.  Have a firm chair that has side arms. You can use this for support while you get dressed.  Do not have throw rugs and other things on the floor that can make you trip. What can I do in the kitchen?  Clean up any spills right away.  Avoid walking on wet floors.  Keep items that you use a lot in easy-to-reach places.  If you need to reach something above you, use a strong step stool that has a grab bar.  Keep electrical cords out of the way.  Do not use floor polish or wax that makes floors slippery. If you  must use wax, use non-skid floor wax.  Do not have throw rugs and other things on the floor that can make you trip. What can I do with my stairs?  Do not leave any items on the stairs.  Make sure that there are handrails on both sides of the stairs and use them. Fix handrails that are broken or loose. Make sure that handrails are as long as the stairways.  Check any carpeting to make sure that it is firmly attached to the stairs. Fix any carpet that is loose or worn.  Avoid having throw rugs at the top or bottom of the stairs. If you do have throw rugs, attach them to the floor with carpet tape.  Make sure that you have a light switch at the top of the stairs and the bottom of the stairs. If you do not have them, ask someone to add them for you. What else can I do to help prevent falls?  Wear shoes that:  Do not have high heels.  Have rubber bottoms.  Are comfortable and fit you well.  Are closed at the  toe. Do not wear sandals.  If you use a stepladder:  Make sure that it is fully opened. Do not climb a closed stepladder.  Make sure that both sides of the stepladder are locked into place.  Ask someone to hold it for you, if possible.  Clearly mark and make sure that you can see:  Any grab bars or handrails.  First and last steps.  Where the edge of each step is.  Use tools that help you move around (mobility aids) if they are needed. These include:  Canes.  Walkers.  Scooters.  Crutches.  Turn on the lights when you go into a dark area. Replace any light bulbs as soon as they burn out.  Set up your furniture so you have a clear path. Avoid moving your furniture around.  If any of your floors are uneven, fix them.  If there are any pets around you, be aware of where they are.  Review your medicines with your doctor. Some medicines can make you feel dizzy. This can increase your chance of falling. Ask your doctor what other things that you can do to help prevent falls. This information is not intended to replace advice given to you by your health care provider. Make sure you discuss any questions you have with your health care provider. Document Released: 05/12/2009 Document Revised: 12/22/2015 Document Reviewed: 08/20/2014 Elsevier Interactive Patient Education  2017 [employer].

## 2019-09-13 ENCOUNTER — Ambulatory Visit: Payer: Medicare Other

## 2019-09-18 IMAGING — US US CAROTID DUPLEX BILAT
1 series · 13 of 24 positions shown · non-contrast
Comparison: None.

CLINICAL DATA: 66-year-old male with a history of TIA.

Cardiovascular risk factors include hypertension, known stroke/TIA,
hyperlipidemia, tobacco use
EXAM:
BILATERAL CAROTID DUPLEX ULTRASOUND
TECHNIQUE: Gray scale imaging, color Doppler and duplex ultrasound were
performed of bilateral carotid and vertebral arteries in the neck.

[Series 1: us carotid duplex bilat · 13 of 72 slices shown]
[im 1/72]
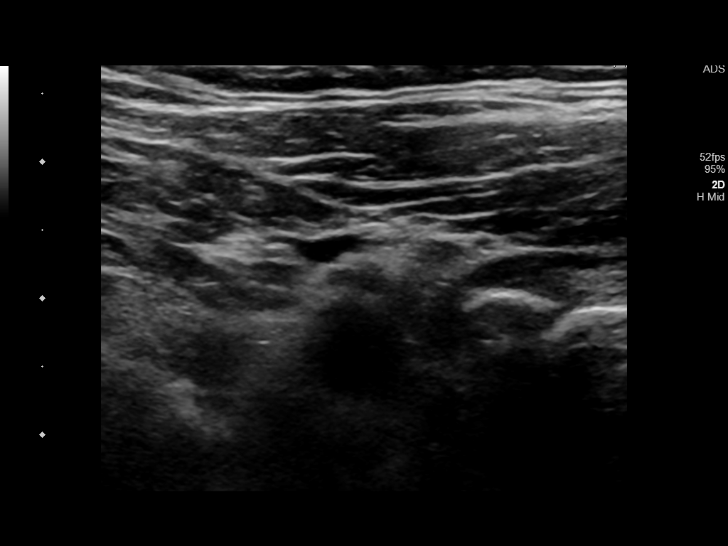
[im 7/72]
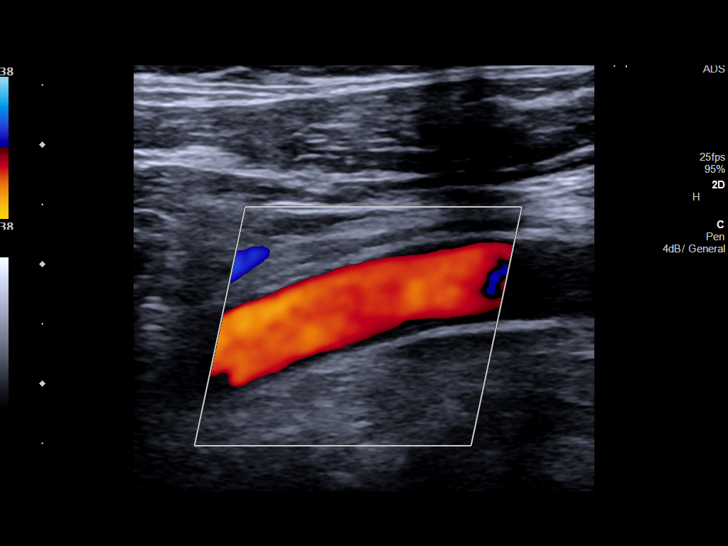
[im 13/72]
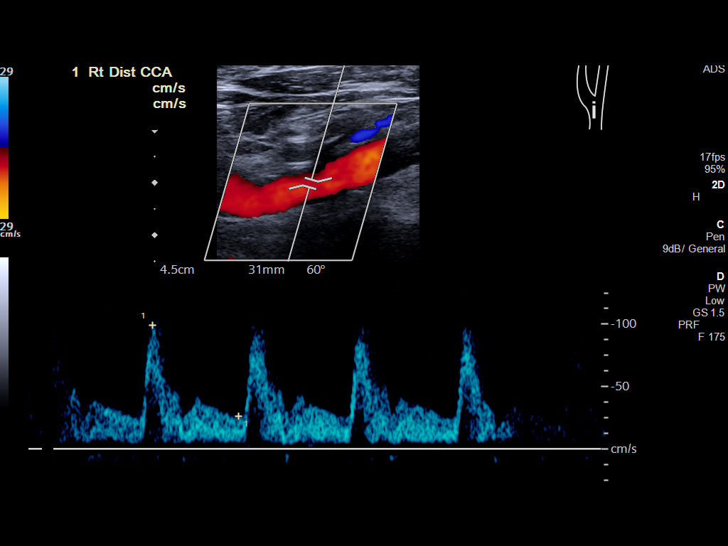
[im 19/72]
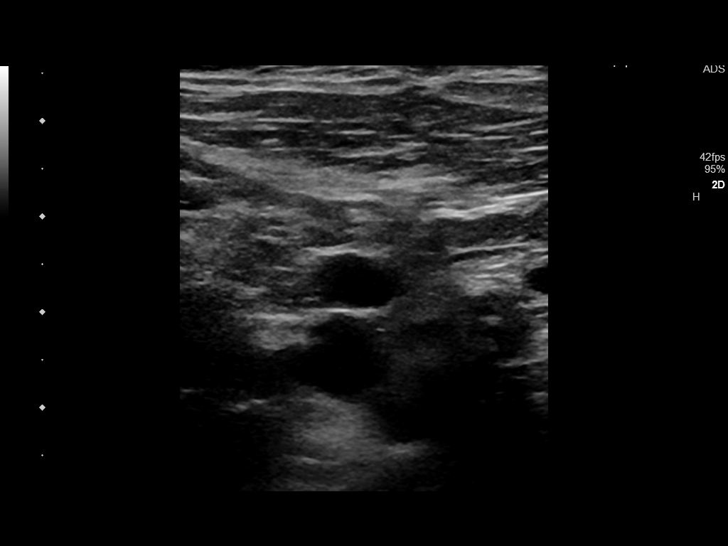
[im 25/72]
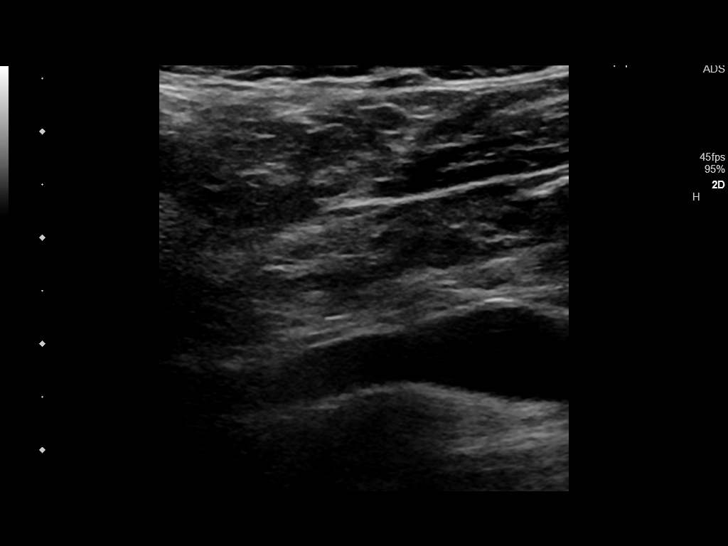
[im 31/72]
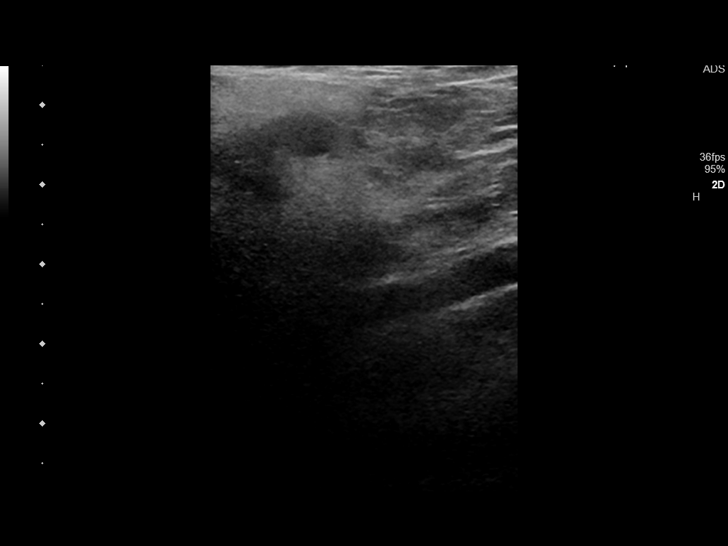
[im 38/72]
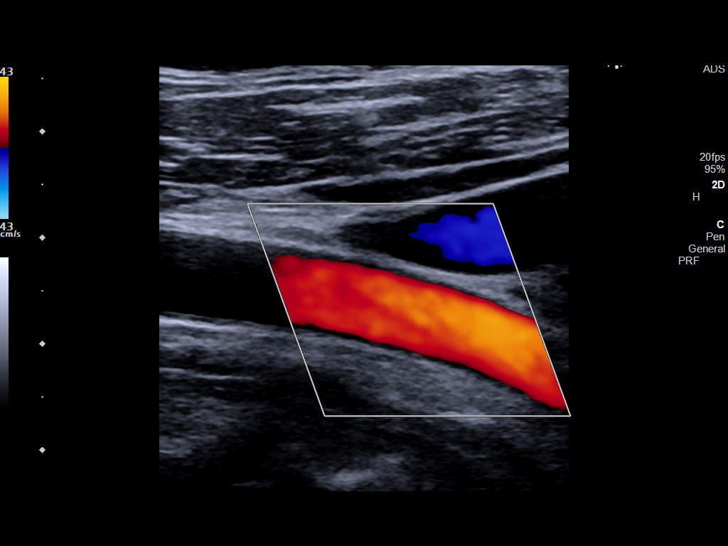
[im 41/72]
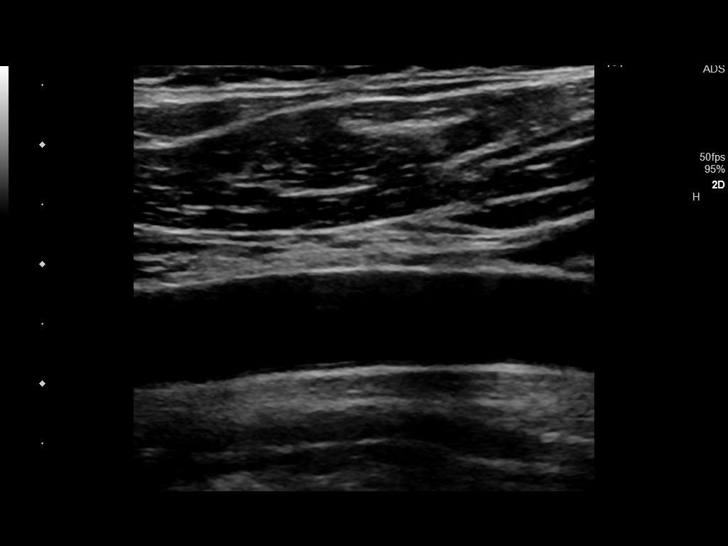
[im 47/72]
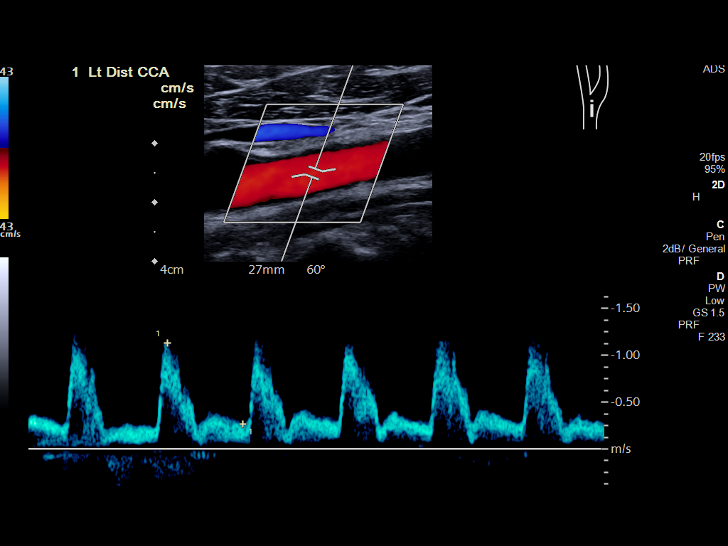
[im 53/72]
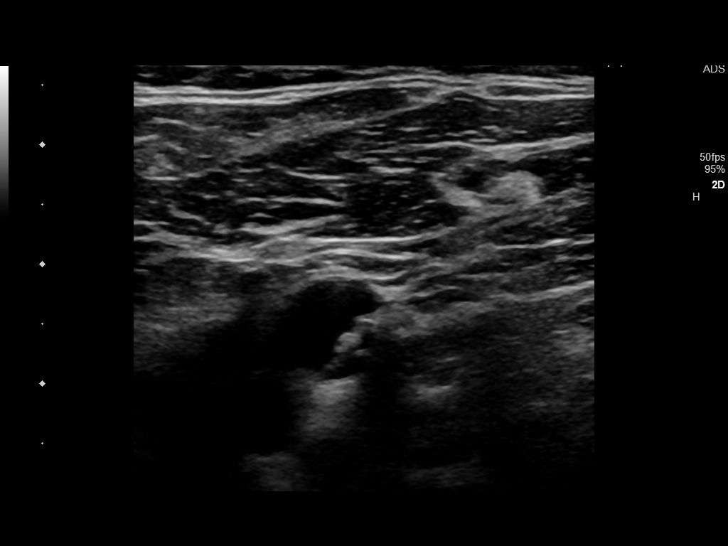
[im 59/72]
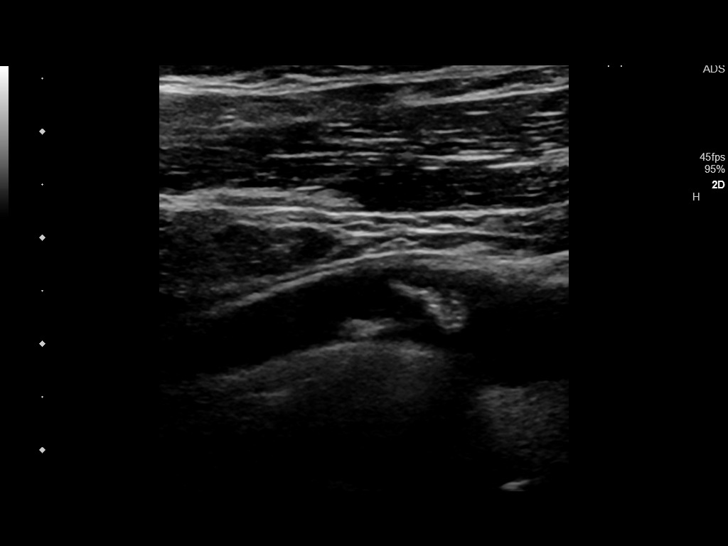
[im 65/72]
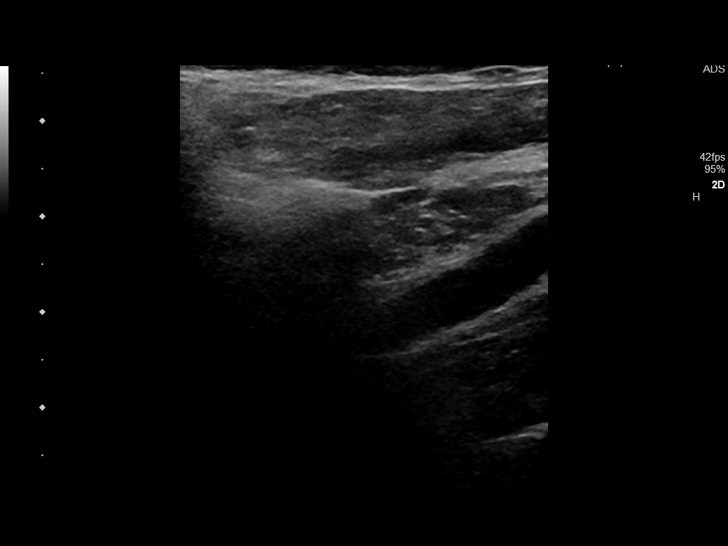
[im 72/72]
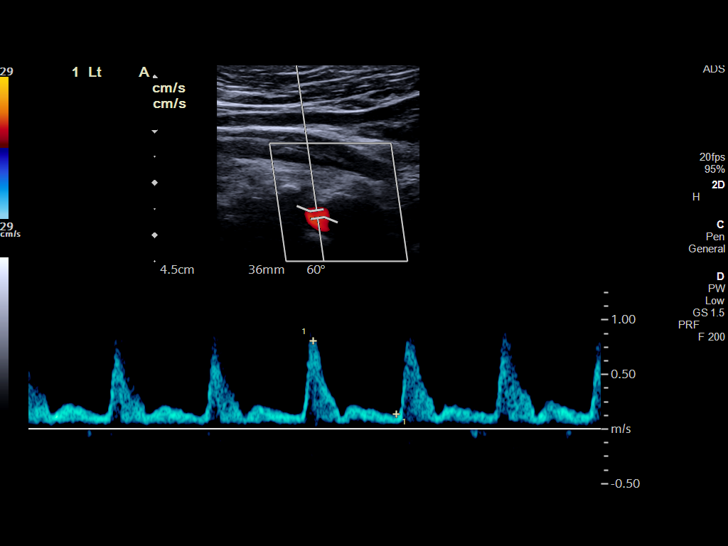

[13 of 24 positions shown; findings below may reference images not displayed]

FINDINGS: Criteria: Quantification of carotid stenosis is based on velocity
parameters that correlate the residual internal carotid diameter
with NASCET-based stenosis levels, using the diameter of the distal
internal carotid lumen as the denominator for stenosis measurement.

The following velocity measurements were obtained:

RIGHT

ICA:  Systolic 85 cm/sec, Diastolic 26 cm/sec

CCA:  83 cm/sec

SYSTOLIC ICA/CCA RATIO:

ECA:  85 cm/sec

LEFT

ICA:  Systolic 76 cm/sec, Diastolic 18 cm/sec

CCA:  121 cm/sec

SYSTOLIC ICA/CCA RATIO:

ECA:  108 cm/sec

Right Brachial SBP: Not acquired

Left Brachial SBP: Not acquired

RIGHT CAROTID ARTERY: No significant calcified disease of the right
common carotid artery. Intermediate waveform maintained.
Heterogeneous plaque without significant calcifications at the right
carotid bifurcation. Low resistance waveform of the right ICA. No
significant tortuosity.

RIGHT VERTEBRAL ARTERY: Antegrade flow with low resistance waveform.

LEFT CAROTID ARTERY: No significant calcified disease of the left
common carotid artery. Intermediate waveform maintained.
Heterogeneous plaque at the left carotid bifurcation without
significant calcifications. Low resistance waveform of the left ICA.

LEFT VERTEBRAL ARTERY:  Antegrade flow with low resistance waveform.
IMPRESSION: Color duplex indicates minimal heterogeneous plaque, with no
hemodynamically significant stenosis by duplex criteria in the
extracranial cerebrovascular circulation.

## 2019-11-10 DIAGNOSIS — H353211 Exudative age-related macular degeneration, right eye, with active choroidal neovascularization: Secondary | ICD-10-CM | POA: Diagnosis not present

## 2019-12-14 DIAGNOSIS — Z85828 Personal history of other malignant neoplasm of skin: Secondary | ICD-10-CM | POA: Diagnosis not present

## 2019-12-14 DIAGNOSIS — D485 Neoplasm of uncertain behavior of skin: Secondary | ICD-10-CM | POA: Diagnosis not present

## 2019-12-14 DIAGNOSIS — Z86018 Personal history of other benign neoplasm: Secondary | ICD-10-CM | POA: Diagnosis not present

## 2019-12-14 DIAGNOSIS — L578 Other skin changes due to chronic exposure to nonionizing radiation: Secondary | ICD-10-CM | POA: Diagnosis not present

## 2019-12-14 DIAGNOSIS — L570 Actinic keratosis: Secondary | ICD-10-CM | POA: Diagnosis not present

## 2019-12-14 DIAGNOSIS — Z859 Personal history of malignant neoplasm, unspecified: Secondary | ICD-10-CM | POA: Diagnosis not present

## 2019-12-14 DIAGNOSIS — Z872 Personal history of diseases of the skin and subcutaneous tissue: Secondary | ICD-10-CM | POA: Diagnosis not present

## 2020-01-06 DIAGNOSIS — C44329 Squamous cell carcinoma of skin of other parts of face: Secondary | ICD-10-CM | POA: Diagnosis not present

## 2020-02-03 ENCOUNTER — Ambulatory Visit (INDEPENDENT_AMBULATORY_CARE_PROVIDER_SITE_OTHER): Payer: Medicare Other | Admitting: Family Medicine

## 2020-02-03 ENCOUNTER — Other Ambulatory Visit: Payer: Self-pay

## 2020-02-03 ENCOUNTER — Encounter: Payer: Self-pay | Admitting: Family Medicine

## 2020-02-03 VITALS — BP 120/80 | HR 80 | Ht 72.0 in | Wt 214.0 lb

## 2020-02-03 DIAGNOSIS — I10 Essential (primary) hypertension: Secondary | ICD-10-CM

## 2020-02-03 DIAGNOSIS — K2100 Gastro-esophageal reflux disease with esophagitis, without bleeding: Secondary | ICD-10-CM | POA: Diagnosis not present

## 2020-02-03 DIAGNOSIS — E782 Mixed hyperlipidemia: Secondary | ICD-10-CM

## 2020-02-03 MED ORDER — LOSARTAN POTASSIUM 50 MG PO TABS: ORAL_TABLET | ORAL | 1 refills | Status: DC

## 2020-02-03 MED ORDER — METOPROLOL SUCCINATE ER 25 MG PO TB24: 25.0000 mg | ORAL_TABLET | Freq: Every day | ORAL | 1 refills | Status: DC

## 2020-02-03 MED ORDER — PANTOPRAZOLE SODIUM 40 MG PO TBEC: 40.0000 mg | DELAYED_RELEASE_TABLET | Freq: Every day | ORAL | 1 refills | Status: DC

## 2020-02-03 MED ORDER — ATORVASTATIN CALCIUM 10 MG PO TABS: 10.0000 mg | ORAL_TABLET | Freq: Every day | ORAL | 1 refills | Status: DC

## 2020-02-03 MED ORDER — AMLODIPINE BESYLATE 5 MG PO TABS: 5.0000 mg | ORAL_TABLET | Freq: Every day | ORAL | 1 refills | Status: DC

## 2020-02-03 NOTE — Progress Notes (Signed)
Date:  02/03/2020   Name:  Juan Moreno   DOB:  27-Apr-1951   MRN:  [id number]   Chief Complaint: Hypertension, Gastroesophageal Reflux, and Hyperlipidemia  Hypertension This is a chronic problem. The current episode started more than 1 year ago. The problem has been gradually improving since onset. The problem is controlled. Pertinent negatives include no anxiety, blurred vision, chest pain, headaches, malaise/fatigue, neck pain, orthopnea, palpitations, peripheral edema, PND, shortness of breath or sweats. There are no associated agents to hypertension. Risk factors for coronary artery disease include dyslipidemia. Past treatments include beta blockers, calcium channel blockers and angiotensin blockers. The current treatment provides no improvement. There are no compliance problems.  There is no history of angina, kidney disease, CAD/MI, CVA, heart failure, left ventricular hypertrophy or PVD. There is no history of chronic renal disease, a hypertension causing med or renovascular disease.  Gastroesophageal Reflux He reports no abdominal pain, no chest pain, no coughing, no dysphagia, no heartburn, no nausea, no sore throat or no wheezing. This is a chronic problem. The current episode started more than 1 year ago. The problem has been gradually improving. The symptoms are aggravated by certain foods. Pertinent negatives include no anemia, fatigue, melena, muscle weakness, orthopnea or weight loss. Risk factors include NSAIDs. He has tried a PPI for the symptoms. The treatment provided moderate relief. Past procedures do not include an abdominal ultrasound, an EGD, esophageal manometry, esophageal pH monitoring or H. pylori antibody titer.  Hyperlipidemia This is a chronic problem. The current episode started more than 1 year ago. The problem is controlled. Recent lipid tests were reviewed and are normal. He has no history of chronic renal disease or diabetes. Pertinent negatives include no  chest pain, myalgias or shortness of breath. Current antihyperlipidemic treatment includes statins. The current treatment provides moderate improvement of lipids. There are no compliance problems.  Risk factors for coronary artery disease include dyslipidemia.    Lab Results  Component Value Date   CREATININE 1.30 (H) 08/06/2019   BUN 17 08/06/2019   NA 137 08/06/2019   K 4.9 08/06/2019   CL 99 08/06/2019   CO2 24 08/06/2019   Lab Results  Component Value Date   CHOL 101 02/16/2019   HDL 39 (L) 02/16/2019   LDLCALC 40 02/16/2019   TRIG 111 02/16/2019   CHOLHDL 2.6 02/16/2019   Lab Results  Component Value Date   TSH 1.943 02/12/2018   Lab Results  Component Value Date   HGBA1C 5.8 (H) 08/06/2019   Lab Results  Component Value Date   WBC 9.6 02/12/2018   HGB 16.6 02/12/2018   HCT 49.1 02/12/2018   MCV 84.9 02/12/2018   PLT 301 02/12/2018   Lab Results  Component Value Date   ALT 23 08/06/2019   AST 26 08/06/2019   ALKPHOS 101 08/06/2019   BILITOT 2.1 (H) 08/06/2019     Review of Systems  Constitutional: Negative for chills, fatigue, fever, malaise/fatigue and weight loss.  HENT: Negative for drooling, ear discharge, ear pain and sore throat.   Eyes: Negative for blurred vision.  Respiratory: Negative for cough, shortness of breath and wheezing.   Cardiovascular: Negative for chest pain, palpitations, orthopnea, leg swelling and PND.  Gastrointestinal: Negative for abdominal pain, blood in stool, constipation, diarrhea, dysphagia, heartburn, melena and nausea.  Endocrine: Negative for polydipsia.  Genitourinary: Negative for difficulty urinating, dysuria, frequency, hematuria and urgency.  Musculoskeletal: Negative for back pain, myalgias, muscle weakness and neck pain.  Skin:  Negative for rash.  Allergic/Immunologic: Negative for environmental allergies.  Neurological: Negative for dizziness and headaches.  Hematological: Does not bruise/bleed easily.    Psychiatric/Behavioral: Negative for suicidal ideas. The patient is not nervous/anxious.     Patient Active Problem List   Diagnosis Date Noted  . Vertigo 02/12/2018  . Colon cancer screening 09/17/2016  . Obesity 04/11/2015  . Essential hypertension 03/21/2015  . Hyperlipidemia 03/21/2015  . Gastroesophageal reflux disease with esophagitis 03/21/2015    Allergies  Allergen Reactions  . Penicillins Rash    Past Surgical History:  Procedure Laterality Date  . COLONOSCOPY  2013   cleared for 4 yrs- Dr Vira Agar- [place]  . COLONOSCOPY WITH PROPOFOL N/A 12/26/2016   Procedure: COLONOSCOPY WITH PROPOFOL;  Surgeon: Manya Silvas, MD;  Location: [facility] ENDOSCOPY;  Service: Endoscopy;  Laterality: N/A;  . STAPEDES SURGERY      Social History   Tobacco Use  . Smoking status: Never Smoker  . Smokeless tobacco: Former Systems developer    Types: Secondary school teacher  . Vaping Use: Never used  Substance Use Topics  . Alcohol use: Yes    Alcohol/week: 0.0 standard drinks    Comment: rare  . Drug use: No     Medication list has been reviewed and updated.  Current Meds  Medication Sig  . amLODipine (NORVASC) 5 MG tablet Take 1 tablet (5 mg total) by mouth daily.  Marland Kitchen aspirin EC 81 MG EC tablet Take 1 tablet (81 mg total) by mouth daily.  Marland Kitchen atorvastatin (LIPITOR) 10 MG tablet Take 1 tablet (10 mg total) by mouth daily at 6 (six) AM.  . loratadine (CLARITIN) 10 MG tablet Take 10 mg by mouth daily. otc  . losartan (COZAAR) 50 MG tablet TAKE 1 TABLET(50 MG) BY MOUTH DAILY  . metoprolol succinate (TOPROL-XL) 25 MG 24 hr tablet Take 1 tablet (25 mg total) by mouth daily.  . pantoprazole (PROTONIX) 40 MG tablet Take 1 tablet (40 mg total) by mouth daily.    PHQ 2/9 Scores 02/03/2020 09/02/2019 08/06/2019 04/01/2019  PHQ - 2 Score 0 0 0 0  PHQ- 9 Score 0 - 0 0    GAD 7 : Generalized Anxiety Score 02/03/2020 08/06/2019  Nervous, Anxious, on Edge 0 0  Control/stop worrying 0 0  Worry too much - different  things 0 0  Trouble relaxing 0 0  Restless 0 0  Easily annoyed or irritable 0 0  Afraid - awful might happen 0 0  Total GAD 7 Score 0 0    BP Readings from Last 3 Encounters:  02/03/20 120/80  09/02/19 121/77  08/06/19 128/80    Physical Exam Vitals and nursing note reviewed.  HENT:     Head: Normocephalic.     Right Ear: Tympanic membrane, ear canal and external ear normal. There is no impacted cerumen.     Left Ear: Tympanic membrane, ear canal and external ear normal. There is no impacted cerumen.     Nose: Nose normal. No congestion or rhinorrhea.     Mouth/Throat:     Mouth: Mucous membranes are moist.  Eyes:     General: No scleral icterus.       Right eye: No discharge.        Left eye: No discharge.     Conjunctiva/sclera: Conjunctivae normal.     Pupils: Pupils are equal, round, and reactive to light.  Neck:     Thyroid: No thyromegaly.     Vascular: No JVD.  Trachea: No tracheal deviation.  Cardiovascular:     Rate and Rhythm: Normal rate and regular rhythm.     Heart sounds: Normal heart sounds. No murmur heard.  No friction rub. No gallop.   Pulmonary:     Effort: No respiratory distress.     Breath sounds: Normal breath sounds. No wheezing, rhonchi or rales.  Abdominal:     General: Bowel sounds are normal.     Palpations: Abdomen is soft. There is no mass.     Tenderness: There is no abdominal tenderness. There is no guarding or rebound.  Musculoskeletal:        General: No tenderness. Normal range of motion.     Cervical back: Normal range of motion and neck supple.  Lymphadenopathy:     Cervical: No cervical adenopathy.  Skin:    General: Skin is warm.     Findings: No rash.  Neurological:     Mental Status: He is alert and oriented to person, place, and time.     Cranial Nerves: No cranial nerve deficit.     Deep Tendon Reflexes: Reflexes are normal and symmetric.     Wt Readings from Last 3 Encounters:  02/03/20 214 lb (97.1 kg)    09/02/19 217 lb (98.4 kg)  08/06/19 216 lb (98 kg)    BP 120/80   Pulse 80   Ht 6' (1.829 m)   Wt 214 lb (97.1 kg)   BMI 29.02 kg/m   Assessment and Plan:  1. Essential hypertension Chronic.  Controlled.  Stable.  Continue amlodipine 5 mg once a day, losartan 50 mg once a day, metoprolol XL 25 mg once a day.  Will obtain renal function panel to assess electrolytes and GFR. - amLODipine (NORVASC) 5 MG tablet; Take 1 tablet (5 mg total) by mouth daily.  Dispense: 90 tablet; Refill: 1 - losartan (COZAAR) 50 MG tablet; TAKE 1 TABLET(50 MG) BY MOUTH DAILY  Dispense: 90 tablet; Refill: 1 - metoprolol succinate (TOPROL-XL) 25 MG 24 hr tablet; Take 1 tablet (25 mg total) by mouth daily.  Dispense: 90 tablet; Refill: 1 - Renal Function Panel  2. Mixed hyperlipidemia Chronic.  Controlled.  Stable.  Continue atorvastatin 10 mg once a day.  Will check current lipid control with panel and ratio. - atorvastatin (LIPITOR) 10 MG tablet; Take 1 tablet (10 mg total) by mouth daily at 6 (six) AM.  Dispense: 90 tablet; Refill: 1 - Lipid Panel With LDL/HDL Ratio  3. Gastroesophageal reflux disease with esophagitis Chronic.  Controlled.  Stable.  Continue pantoprazole 40 mg once a day. - pantoprazole (PROTONIX) 40 MG tablet; Take 1 tablet (40 mg total) by mouth daily.  Dispense: 90 tablet; Refill: 1

## 2020-02-04 LAB — LIPID PANEL WITH LDL/HDL RATIO
Cholesterol, Total: 99 mg/dL — ABNORMAL LOW (ref 100–199)
HDL: 43 mg/dL (ref >39)
LDL Chol Calc (NIH): 38 mg/dL (ref 0–99)
LDL/HDL Ratio: 0.9 ratio (ref 0.0–3.6)
Triglycerides: 95 mg/dL (ref 0–149)
VLDL Cholesterol Cal: 18 mg/dL (ref 5–40)

## 2020-02-04 LAB — RENAL FUNCTION PANEL
Albumin: 4.4 g/dL (ref 3.8–4.8)
BUN/Creatinine Ratio: 13 (ref 10–24)
BUN: 16 mg/dL (ref 8–27)
CO2: 26 mmol/L (ref 20–29)
Calcium: 9.3 mg/dL (ref 8.6–10.2)
Chloride: 99 mmol/L (ref 96–106)
Creatinine, Ser: 1.21 mg/dL (ref 0.76–1.27)
GFR calc Af Amer: 70 mL/min/{1.73_m2} (ref >59)
GFR calc non Af Amer: 60 mL/min/{1.73_m2} (ref >59)
Glucose: 87 mg/dL (ref 65–99)
Phosphorus: 3 mg/dL (ref 2.8–4.1)
Potassium: 4.8 mmol/L (ref 3.5–5.2)
Sodium: 136 mmol/L (ref 134–144)

## 2020-03-29 DIAGNOSIS — H353211 Exudative age-related macular degeneration, right eye, with active choroidal neovascularization: Secondary | ICD-10-CM | POA: Diagnosis not present

## 2020-04-23 DIAGNOSIS — Z23 Encounter for immunization: Secondary | ICD-10-CM | POA: Diagnosis not present

## 2020-05-04 DIAGNOSIS — H6123 Impacted cerumen, bilateral: Secondary | ICD-10-CM | POA: Diagnosis not present

## 2020-05-10 DIAGNOSIS — Z23 Encounter for immunization: Secondary | ICD-10-CM | POA: Diagnosis not present

## 2020-06-07 DIAGNOSIS — H353211 Exudative age-related macular degeneration, right eye, with active choroidal neovascularization: Secondary | ICD-10-CM | POA: Diagnosis not present

## 2020-07-14 DIAGNOSIS — L570 Actinic keratosis: Secondary | ICD-10-CM | POA: Diagnosis not present

## 2020-07-14 DIAGNOSIS — Z872 Personal history of diseases of the skin and subcutaneous tissue: Secondary | ICD-10-CM | POA: Diagnosis not present

## 2020-07-14 DIAGNOSIS — L578 Other skin changes due to chronic exposure to nonionizing radiation: Secondary | ICD-10-CM | POA: Diagnosis not present

## 2020-07-14 DIAGNOSIS — Z86018 Personal history of other benign neoplasm: Secondary | ICD-10-CM | POA: Diagnosis not present

## 2020-07-14 DIAGNOSIS — Z85828 Personal history of other malignant neoplasm of skin: Secondary | ICD-10-CM | POA: Diagnosis not present

## 2020-07-14 DIAGNOSIS — Z859 Personal history of malignant neoplasm, unspecified: Secondary | ICD-10-CM | POA: Diagnosis not present

## 2020-07-26 ENCOUNTER — Other Ambulatory Visit: Payer: Self-pay | Admitting: Family Medicine

## 2020-07-26 DIAGNOSIS — I10 Essential (primary) hypertension: Secondary | ICD-10-CM

## 2020-07-26 DIAGNOSIS — E782 Mixed hyperlipidemia: Secondary | ICD-10-CM

## 2020-08-03 ENCOUNTER — Other Ambulatory Visit: Payer: Self-pay | Admitting: Family Medicine

## 2020-08-03 DIAGNOSIS — I10 Essential (primary) hypertension: Secondary | ICD-10-CM

## 2020-08-04 NOTE — Telephone Encounter (Signed)
   Notes to clinic Pt has appt today 1/7, please assess.

## 2020-08-05 ENCOUNTER — Ambulatory Visit (INDEPENDENT_AMBULATORY_CARE_PROVIDER_SITE_OTHER): Payer: Medicare Other | Admitting: Family Medicine

## 2020-08-05 ENCOUNTER — Other Ambulatory Visit: Payer: Self-pay

## 2020-08-05 ENCOUNTER — Encounter: Payer: Self-pay | Admitting: Family Medicine

## 2020-08-05 VITALS — BP 120/80 | HR 72 | Ht 72.0 in | Wt 220.0 lb

## 2020-08-05 DIAGNOSIS — E782 Mixed hyperlipidemia: Secondary | ICD-10-CM | POA: Diagnosis not present

## 2020-08-05 DIAGNOSIS — R351 Nocturia: Secondary | ICD-10-CM | POA: Diagnosis not present

## 2020-08-05 DIAGNOSIS — I10 Essential (primary) hypertension: Secondary | ICD-10-CM

## 2020-08-05 DIAGNOSIS — K2100 Gastro-esophageal reflux disease with esophagitis, without bleeding: Secondary | ICD-10-CM | POA: Diagnosis not present

## 2020-08-05 MED ORDER — PANTOPRAZOLE SODIUM 40 MG PO TBEC: 40.0000 mg | DELAYED_RELEASE_TABLET | Freq: Every day | ORAL | 1 refills | Status: DC

## 2020-08-05 MED ORDER — AMLODIPINE BESYLATE 5 MG PO TABS: 5.0000 mg | ORAL_TABLET | Freq: Every day | ORAL | 1 refills | Status: DC

## 2020-08-05 MED ORDER — METOPROLOL SUCCINATE ER 25 MG PO TB24: 25.0000 mg | ORAL_TABLET | Freq: Every day | ORAL | 1 refills | Status: DC

## 2020-08-05 MED ORDER — ATORVASTATIN CALCIUM 10 MG PO TABS: ORAL_TABLET | ORAL | 1 refills | Status: DC

## 2020-08-05 MED ORDER — LOSARTAN POTASSIUM 50 MG PO TABS: ORAL_TABLET | ORAL | 0 refills | Status: DC

## 2020-08-05 NOTE — Progress Notes (Signed)
Date:  08/05/2020   Name:  Juan Moreno   DOB:  Feb 25, 1951   MRN:  [id number]   Chief Complaint: Gastroesophageal Reflux, Hyperlipidemia, and Hypertension  Gastroesophageal Reflux He reports no abdominal pain, no belching, no chest pain, no choking, no coughing, no dysphagia, no early satiety, no globus sensation, no heartburn, no hoarse voice, no nausea, no sore throat, no stridor, no tooth decay, no water brash or no wheezing. This is a chronic problem. The current episode started more than 1 year ago. The problem has been gradually improving. The symptoms are aggravated by certain foods. Pertinent negatives include no anemia, fatigue, melena, muscle weakness, orthopnea or weight loss. He has tried a PPI for the symptoms. The treatment provided moderate relief.  Hyperlipidemia This is a chronic problem. The current episode started more than 1 year ago. The problem is controlled. Recent lipid tests were reviewed and are normal. He has no history of chronic renal disease, diabetes, hypothyroidism, liver disease, obesity or nephrotic syndrome. There are no known factors aggravating his hyperlipidemia. Pertinent negatives include no chest pain, focal sensory loss, focal weakness, leg pain, myalgias or shortness of breath. Current antihyperlipidemic treatment includes statins. The current treatment provides moderate improvement of lipids. There are no compliance problems.   Hypertension This is a chronic problem. The current episode started more than 1 year ago. The problem has been gradually improving since onset. Pertinent negatives include no chest pain, headaches, neck pain, palpitations or shortness of breath. There are no associated agents to hypertension. There is no history of chronic renal disease.    Lab Results  Component Value Date   CREATININE 1.21 02/03/2020   BUN 16 02/03/2020   NA 136 02/03/2020   K 4.8 02/03/2020   CL 99 02/03/2020   CO2 26 02/03/2020   Lab Results   Component Value Date   CHOL 99 (L) 02/03/2020   HDL 43 02/03/2020   LDLCALC 38 02/03/2020   TRIG 95 02/03/2020   CHOLHDL 2.6 02/16/2019   Lab Results  Component Value Date   TSH 1.943 02/12/2018   Lab Results  Component Value Date   HGBA1C 5.8 (H) 08/06/2019   Lab Results  Component Value Date   WBC 9.6 02/12/2018   HGB 16.6 02/12/2018   HCT 49.1 02/12/2018   MCV 84.9 02/12/2018   PLT 301 02/12/2018   Lab Results  Component Value Date   ALT 23 08/06/2019   AST 26 08/06/2019   ALKPHOS 101 08/06/2019   BILITOT 2.1 (H) 08/06/2019     Review of Systems  Constitutional: Negative for chills, fatigue, fever and weight loss.  HENT: Negative for drooling, ear discharge, ear pain, hoarse voice and sore throat.   Respiratory: Negative for cough, choking, shortness of breath and wheezing.   Cardiovascular: Negative for chest pain, palpitations and leg swelling.  Gastrointestinal: Negative for abdominal pain, blood in stool, constipation, diarrhea, dysphagia, heartburn, melena and nausea.  Endocrine: Negative for polydipsia.  Genitourinary: Negative for dysuria, frequency, hematuria and urgency.       Nocturia 2x night  Musculoskeletal: Negative for back pain, myalgias, muscle weakness and neck pain.  Skin: Negative for rash.  Allergic/Immunologic: Negative for environmental allergies.  Neurological: Negative for dizziness, focal weakness and headaches.  Hematological: Does not bruise/bleed easily.  Psychiatric/Behavioral: Negative for suicidal ideas. The patient is not nervous/anxious.     Patient Active Problem List   Diagnosis Date Noted  . Vertigo 02/12/2018  . Colon cancer screening 09/17/2016  .  Obesity 04/11/2015  . Essential hypertension 03/21/2015  . Hyperlipidemia 03/21/2015  . Gastroesophageal reflux disease with esophagitis 03/21/2015    Allergies  Allergen Reactions  . Penicillins Rash    Past Surgical History:  Procedure Laterality Date  .  COLONOSCOPY  2013   cleared for 4 yrs- Dr Vira Agar- [place]  . COLONOSCOPY WITH PROPOFOL N/A 12/26/2016   Procedure: COLONOSCOPY WITH PROPOFOL;  Surgeon: Manya Silvas, MD;  Location: [facility] ENDOSCOPY;  Service: Endoscopy;  Laterality: N/A;  . STAPEDES SURGERY      Social History   Tobacco Use  . Smoking status: Never Smoker  . Smokeless tobacco: Former Systems developer    Types: Secondary school teacher  . Vaping Use: Never used  Substance Use Topics  . Alcohol use: Yes    Alcohol/week: 0.0 standard drinks    Comment: rare  . Drug use: No     Medication list has been reviewed and updated.  Current Meds  Medication Sig  . amLODipine (NORVASC) 5 MG tablet Take 1 tablet (5 mg total) by mouth daily.  Marland Kitchen aspirin EC 81 MG EC tablet Take 1 tablet (81 mg total) by mouth daily.  Marland Kitchen atorvastatin (LIPITOR) 10 MG tablet TAKE 1 TABLET(10 MG) BY MOUTH DAILY AT 6 AM  . loratadine (CLARITIN) 10 MG tablet Take 10 mg by mouth daily. otc  . losartan (COZAAR) 50 MG tablet TAKE 1 TABLET(50 MG) BY MOUTH DAILY  . metoprolol succinate (TOPROL-XL) 25 MG 24 hr tablet Take 1 tablet (25 mg total) by mouth daily.  . pantoprazole (PROTONIX) 40 MG tablet Take 1 tablet (40 mg total) by mouth daily.    PHQ 2/9 Scores 02/03/2020 09/02/2019 08/06/2019 04/01/2019  PHQ - 2 Score 0 0 0 0  PHQ- 9 Score 0 - 0 0    GAD 7 : Generalized Anxiety Score 02/03/2020 08/06/2019  Nervous, Anxious, on Edge 0 0  Control/stop worrying 0 0  Worry too much - different things 0 0  Trouble relaxing 0 0  Restless 0 0  Easily annoyed or irritable 0 0  Afraid - awful might happen 0 0  Total GAD 7 Score 0 0    BP Readings from Last 3 Encounters:  08/05/20 120/80  02/03/20 120/80  09/02/19 121/77    Physical Exam Vitals and nursing note reviewed.  HENT:     Head: Normocephalic.     Right Ear: Tympanic membrane, ear canal and external ear normal.     Left Ear: Tympanic membrane, ear canal and external ear normal.     Nose: Nose normal. No  congestion or rhinorrhea.     Mouth/Throat:     Mouth: Oropharynx is clear and moist.  Eyes:     General: No scleral icterus.       Right eye: No discharge.        Left eye: No discharge.     Extraocular Movements: EOM normal.     Conjunctiva/sclera: Conjunctivae normal.     Pupils: Pupils are equal, round, and reactive to light.  Neck:     Thyroid: No thyromegaly.     Vascular: No carotid bruit or JVD.     Trachea: No tracheal deviation.  Cardiovascular:     Rate and Rhythm: Normal rate and regular rhythm.     Pulses: Intact distal pulses.     Heart sounds: Normal heart sounds. No murmur heard. No friction rub. No gallop.   Pulmonary:     Effort: No respiratory distress.  Breath sounds: Normal breath sounds. No wheezing, rhonchi or rales.  Abdominal:     General: Bowel sounds are normal.     Palpations: Abdomen is soft. There is no hepatomegaly, splenomegaly, hepatosplenomegaly or mass.     Tenderness: There is no abdominal tenderness. There is no CVA tenderness, guarding or rebound.  Genitourinary:    Prostate: Normal. Not enlarged, not tender and no nodules present.     Rectum: Normal. Guaiac result negative. No mass.  Musculoskeletal:        General: No tenderness or edema. Normal range of motion.     Cervical back: Normal range of motion and neck supple.  Lymphadenopathy:     Cervical: No cervical adenopathy.  Skin:    General: Skin is warm.     Findings: No rash.  Neurological:     Mental Status: He is alert and oriented to person, place, and time.     Cranial Nerves: No cranial nerve deficit.     Deep Tendon Reflexes: Strength normal and reflexes are normal and symmetric.     Wt Readings from Last 3 Encounters:  08/05/20 220 lb (99.8 kg)  02/03/20 214 lb (97.1 kg)  09/02/19 217 lb (98.4 kg)    BP 120/80   Pulse 72   Ht 6' (1.829 m)   Wt 220 lb (99.8 kg)   BMI 29.84 kg/m   Assessment and Plan: 1. Essential hypertension Chronic.  Controlled.  Stable.   Blood pressure today is 120/80.  We will continue amlodipine 5 mg once a day, losartan 50 mg once a day, and metoprolol XL 25 mg once a day.  Will check CMP for electrolytes and GFR. - amLODipine (NORVASC) 5 MG tablet; Take 1 tablet (5 mg total) by mouth daily.  Dispense: 90 tablet; Refill: 1 - losartan (COZAAR) 50 MG tablet; 1 tablet daily  Dispense: 90 tablet; Refill: 0 - metoprolol succinate (TOPROL-XL) 25 MG 24 hr tablet; Take 1 tablet (25 mg total) by mouth daily.  Dispense: 90 tablet; Refill: 1 - Comprehensive metabolic panel  2. Mixed hyperlipidemia Chronic.  Controlled.  Stable.  Continue atorvastatin 10 mg once a day.  Will recheck lipid panel for current level of triglycerides/LDL. - atorvastatin (LIPITOR) 10 MG tablet; TAKE 1 TABLET(10 MG) BY MOUTH DAILY AT 6 AM  Dispense: 90 tablet; Refill: 1 - Lipid Panel With LDL/HDL Ratio  3. Gastroesophageal reflux disease with esophagitis Chronic.  Controlled.  Stable.  Continue pantoprazole 40 mg once a day.  Patient has no dysphagia guaiac was noted to be negative today. - pantoprazole (PROTONIX) 40 MG tablet; Take 1 tablet (40 mg total) by mouth daily.  Dispense: 90 tablet; Refill: 1  4. Nocturia Chronic.  Episodic.  Patient has 2-3 episodes of nocturia.  DRE notes prostate to be normal size normal consistency without nodularity.  Will check PSA for current status. - PSA

## 2020-08-06 LAB — COMPREHENSIVE METABOLIC PANEL
ALT: 18 IU/L (ref 0–44)
AST: 23 IU/L (ref 0–40)
Albumin/Globulin Ratio: 1.5 (ref 1.2–2.2)
Albumin: 4.2 g/dL (ref 3.7–4.7)
Alkaline Phosphatase: 89 IU/L (ref 44–121)
BUN/Creatinine Ratio: 11 (ref 10–24)
BUN: 14 mg/dL (ref 8–27)
Bilirubin Total: 1.6 mg/dL — ABNORMAL HIGH (ref 0.0–1.2)
CO2: 23 mmol/L (ref 20–29)
Calcium: 9.3 mg/dL (ref 8.6–10.2)
Chloride: 95 mmol/L — ABNORMAL LOW (ref 96–106)
Creatinine, Ser: 1.29 mg/dL — ABNORMAL HIGH (ref 0.76–1.27)
GFR calc Af Amer: 64 mL/min/{1.73_m2} (ref >59)
GFR calc non Af Amer: 55 mL/min/{1.73_m2} — ABNORMAL LOW (ref >59)
Globulin, Total: 2.8 g/dL (ref 1.5–4.5)
Glucose: 87 mg/dL (ref 65–99)
Potassium: 4.6 mmol/L (ref 3.5–5.2)
Sodium: 137 mmol/L (ref 134–144)
Total Protein: 7 g/dL (ref 6.0–8.5)

## 2020-08-06 LAB — PSA: Prostate Specific Ag, Serum: 0.8 ng/mL (ref 0.0–4.0)

## 2020-08-06 LAB — LIPID PANEL WITH LDL/HDL RATIO
Cholesterol, Total: 108 mg/dL (ref 100–199)
HDL: 40 mg/dL (ref >39)
LDL Chol Calc (NIH): 46 mg/dL (ref 0–99)
LDL/HDL Ratio: 1.2 ratio (ref 0.0–3.6)
Triglycerides: 126 mg/dL (ref 0–149)
VLDL Cholesterol Cal: 22 mg/dL (ref 5–40)

## 2020-08-29 DIAGNOSIS — H35711 Central serous chorioretinopathy, right eye: Secondary | ICD-10-CM | POA: Diagnosis not present

## 2020-08-29 DIAGNOSIS — H353211 Exudative age-related macular degeneration, right eye, with active choroidal neovascularization: Secondary | ICD-10-CM | POA: Diagnosis not present

## 2020-09-05 ENCOUNTER — Other Ambulatory Visit: Payer: Self-pay

## 2020-09-05 ENCOUNTER — Ambulatory Visit (INDEPENDENT_AMBULATORY_CARE_PROVIDER_SITE_OTHER): Payer: Medicare Other

## 2020-09-05 VITALS — BP 120/82 | HR 89 | Temp 97.9°F | Resp 16 | Ht 72.0 in | Wt 223.0 lb

## 2020-09-05 DIAGNOSIS — Z0000 Encounter for general adult medical examination without abnormal findings: Secondary | ICD-10-CM

## 2020-09-05 NOTE — Patient Instructions (Signed)
Juan Moreno , Thank you for taking time to come for your Medicare Wellness Visit. I appreciate your ongoing commitment to your health goals. Please review the following plan we discussed and let me know if I can assist you in the future.   Screening recommendations/referrals: Colonoscopy: done 12/26/16 Recommended yearly ophthalmology/optometry visit for glaucoma screening and checkup Recommended yearly dental visit for hygiene and checkup  Vaccinations: Influenza vaccine: done 04/29/20 Pneumococcal vaccine: done 03/20/17 Tdap vaccine: done 09/19/15 Shingles vaccine: Shingrix discussed. Please contact your pharmacy for coverage information or bring copy of vaccine records from [place]: done 08/21/19, 09/11/19 & 03/30/20  Advanced directives: Advance directive discussed with you today. I have provided a copy for you to complete at home and have notarized. Once this is complete please bring a copy in to our office so we can scan it into your chart.  Conditions/risks identified: Recommend continuing healthy eating and physical activity to maintain health and activity  Next appointment: Follow up in one year for your annual wellness visit.   Preventive Care 34 Years and Older, Male Preventive care refers to lifestyle choices and visits with your health care provider that can promote health and wellness. What does preventive care include?  A yearly physical exam. This is also called an annual well check.  Dental exams once or twice a year.  Routine eye exams. Ask your health care provider how often you should have your eyes checked.  Personal lifestyle choices, including:  Daily care of your teeth and gums.  Regular physical activity.  Eating a healthy diet.  Avoiding tobacco and drug use.  Limiting alcohol use.  Practicing safe sex.  Taking low doses of aspirin every day.  Taking vitamin and mineral supplements as recommended by your health care provider. What happens  during an annual well check? The services and screenings done by your health care provider during your annual well check will depend on your age, overall health, lifestyle risk factors, and family history of disease. Counseling  Your health care provider may ask you questions about your:  Alcohol use.  Tobacco use.  Drug use.  Emotional well-being.  Home and relationship well-being.  Sexual activity.  Eating habits.  History of falls.  Memory and ability to understand (cognition).  Work and work Statistician. Screening  You may have the following tests or measurements:  Height, weight, and BMI.  Blood pressure.  Lipid and cholesterol levels. These may be checked every 5 years, or more frequently if you are over 11 years old.  Skin check.  Lung cancer screening. You may have this screening every year starting at age 22 if you have a 30-pack-year history of smoking and currently smoke or have quit within the past 15 years.  Fecal occult blood test (FOBT) of the stool. You may have this test every year starting at age 53.  Flexible sigmoidoscopy or colonoscopy. You may have a sigmoidoscopy every 5 years or a colonoscopy every 10 years starting at age 76.  Prostate cancer screening. Recommendations will vary depending on your family history and other risks.  Hepatitis C blood test.  Hepatitis B blood test.  Sexually transmitted disease (STD) testing.  Diabetes screening. This is done by checking your blood sugar (glucose) after you have not eaten for a while (fasting). You may have this done every 1-3 years.  Abdominal aortic aneurysm (AAA) screening. You may need this if you are a current or former smoker.  Osteoporosis. You may be screened  starting at age 39 if you are at high risk. Talk with your health care provider about your test results, treatment options, and if necessary, the need for more tests. Vaccines  Your health care provider may recommend certain  vaccines, such as:  Influenza vaccine. This is recommended every year.  Tetanus, diphtheria, and acellular pertussis (Tdap, Td) vaccine. You may need a Td booster every 10 years.  Zoster vaccine. You may need this after age 36.  Pneumococcal 13-valent conjugate (PCV13) vaccine. One dose is recommended after age 46.  Pneumococcal polysaccharide (PPSV23) vaccine. One dose is recommended after age 46. Talk to your health care provider about which screenings and vaccines you need and how often you need them. This information is not intended to replace advice given to you by your health care provider. Make sure you discuss any questions you have with your health care provider. Document Released: 08/12/2015 Document Revised: 04/04/2016 Document Reviewed: 05/17/2015 Elsevier Interactive Patient Education  2017 [place] Prevention in the Home Falls can cause injuries. They can happen to people of all ages. There are many things you can do to make your home safe and to help prevent falls. What can I do on the outside of my home?  Regularly fix the edges of walkways and driveways and fix any cracks.  Remove anything that might make you trip as you walk through a door, such as a raised step or threshold.  Trim any bushes or trees on the path to your home.  Use bright outdoor lighting.  Clear any walking paths of anything that might make someone trip, such as rocks or tools.  Regularly check to see if handrails are loose or broken. Make sure that both sides of any steps have handrails.  Any raised decks and porches should have guardrails on the edges.  Have any leaves, snow, or ice cleared regularly.  Use sand or salt on walking paths during winter.  Clean up any spills in your garage right away. This includes oil or grease spills. What can I do in the bathroom?  Use night lights.  Install grab bars by the toilet and in the tub and shower. Do not use towel bars as grab  bars.  Use non-skid mats or decals in the tub or shower.  If you need to sit down in the shower, use a plastic, non-slip stool.  Keep the floor dry. Clean up any water that spills on the floor as soon as it happens.  Remove soap buildup in the tub or shower regularly.  Attach bath mats securely with double-sided non-slip rug tape.  Do not have throw rugs and other things on the floor that can make you trip. What can I do in the bedroom?  Use night lights.  Make sure that you have a light by your bed that is easy to reach.  Do not use any sheets or blankets that are too big for your bed. They should not hang down onto the floor.  Have a firm chair that has side arms. You can use this for support while you get dressed.  Do not have throw rugs and other things on the floor that can make you trip. What can I do in the kitchen?  Clean up any spills right away.  Avoid walking on wet floors.  Keep items that you use a lot in easy-to-reach places.  If you need to reach something above you, use a strong step stool that has a grab  bar.  Keep electrical cords out of the way.  Do not use floor polish or wax that makes floors slippery. If you must use wax, use non-skid floor wax.  Do not have throw rugs and other things on the floor that can make you trip. What can I do with my stairs?  Do not leave any items on the stairs.  Make sure that there are handrails on both sides of the stairs and use them. Fix handrails that are broken or loose. Make sure that handrails are as long as the stairways.  Check any carpeting to make sure that it is firmly attached to the stairs. Fix any carpet that is loose or worn.  Avoid having throw rugs at the top or bottom of the stairs. If you do have throw rugs, attach them to the floor with carpet tape.  Make sure that you have a light switch at the top of the stairs and the bottom of the stairs. If you do not have them, ask someone to add them for  you. What else can I do to help prevent falls?  Wear shoes that:  Do not have high heels.  Have rubber bottoms.  Are comfortable and fit you well.  Are closed at the toe. Do not wear sandals.  If you use a stepladder:  Make sure that it is fully opened. Do not climb a closed stepladder.  Make sure that both sides of the stepladder are locked into place.  Ask someone to hold it for you, if possible.  Clearly mark and make sure that you can see:  Any grab bars or handrails.  First and last steps.  Where the edge of each step is.  Use tools that help you move around (mobility aids) if they are needed. These include:  Canes.  Walkers.  Scooters.  Crutches.  Turn on the lights when you go into a dark area. Replace any light bulbs as soon as they burn out.  Set up your furniture so you have a clear path. Avoid moving your furniture around.  If any of your floors are uneven, fix them.  If there are any pets around you, be aware of where they are.  Review your medicines with your doctor. Some medicines can make you feel dizzy. This can increase your chance of falling. Ask your doctor what other things that you can do to help prevent falls. This information is not intended to replace advice given to you by your health care provider. Make sure you discuss any questions you have with your health care provider. Document Released: 05/12/2009 Document Revised: 12/22/2015 Document Reviewed: 08/20/2014 Elsevier Interactive Patient Education  2017 [employer].

## 2020-09-05 NOTE — Progress Notes (Signed)
Subjective:   Juan Moreno is a 70 y.o. male who presents for Medicare Annual/Subsequent preventive examination.  Review of Systems     Cardiac Risk Factors include: advanced age (>48men, >18 women);dyslipidemia;male gender;hypertension     Objective:    Today's Vitals   09/05/20 0929  BP: 120/82  Pulse: 89  Resp: 16  Temp: 97.9 F (36.6 C)  TempSrc: Oral  SpO2: 97%  Weight: 223 lb (101.2 kg)  Height: 6' (1.829 m)   Body mass index is 30.24 kg/m.  Advanced Directives 09/05/2020 09/02/2019 05/06/2018 02/12/2018 08/09/2017 12/26/2016 03/21/2015  Does Patient Have a Medical Advance Directive? No No No No No No No  Would patient like information on creating a medical advance directive? Yes (MAU/Ambulatory/Procedural Areas - Information given) Yes (MAU/Ambulatory/Procedural Areas - Information given) - Yes (Inpatient - patient requests chaplain consult to create a medical advance directive) - No - Patient declined -    Current Medications (verified) Outpatient Encounter Medications as of 09/05/2020  Medication Sig  . amLODipine (NORVASC) 5 MG tablet Take 1 tablet (5 mg total) by mouth daily.  Marland Kitchen atorvastatin (LIPITOR) 10 MG tablet TAKE 1 TABLET(10 MG) BY MOUTH DAILY AT 6 AM  . loratadine (CLARITIN) 10 MG tablet Take 10 mg by mouth daily. otc  . losartan (COZAAR) 50 MG tablet 1 tablet daily  . metoprolol succinate (TOPROL-XL) 25 MG 24 hr tablet Take 1 tablet (25 mg total) by mouth daily.  . pantoprazole (PROTONIX) 40 MG tablet Take 1 tablet (40 mg total) by mouth daily.  . SODIUM FLUORIDE 5000 PLUS 1.1 % CREA dental cream   . [DISCONTINUED] aspirin EC 81 MG EC tablet Take 1 tablet (81 mg total) by mouth daily.   No facility-administered encounter medications on file as of 09/05/2020.    Allergies (verified) Penicillins   History: Past Medical History:  Diagnosis Date  . GERD (gastroesophageal reflux disease)   . Hyperlipidemia   . Hypertension   . Retinopathy of right  eye    Past Surgical History:  Procedure Laterality Date  . COLONOSCOPY  2013   cleared for 4 yrs- Dr Vira Agar- Divert  . COLONOSCOPY WITH PROPOFOL N/A 12/26/2016   Procedure: COLONOSCOPY WITH PROPOFOL;  Surgeon: Manya Silvas, MD;  Location[facility] END[facility]COPY;  Service: Endoscopy;  Laterality: N/A;  . STAPEDES SURGERY     Family History  Problem Relation Age of Onset  . Hypertension Mother   . Hypertension Father    Social History   Socioeconomic History  . Marital status: Widowed    Spouse name: Not on file  . Number of children: 0  . Years of education: Not on file  . Highest education level: Not on file  Occupational History  . Not on file  Tobacco Use  . Smoking status: Never Smoker  . Smokeless tobacco: Former Systems developer    Types: Secondary school teacher  . Vaping Use: Never used  Substance and Sexual Activity  . Alcohol use: Yes    Alcohol/week: 0.0 standard drinks    Comment: rare  . Drug use: No  . Sexual activity: Never  Other Topics Concern  . Not on file  Social History Narrative   Pt lives alone   Social Determinants of Health   Financial Resource Strain: Low Risk   . Difficulty of Paying Living Expenses: Not hard at all  Food Insecurity: No Food Insecurity  . Worried About Charity fundraiser in the Last Year: Never true  .  Ran Out of Food in the Last Year: Never true  Transportation Needs: No Transportation Needs  . Lack of Transportation (Medical): No  . Lack of Transportation (Non-Medical): No  Physical Activity: Sufficiently Active  . Days of Exercise per Week: 7 days  . Minutes of Exercise per Session: 40 min  Stress: No Stress Concern Present  . Feeling of Stress : Not at all  Social Connections: Moderately Isolated  . Frequency of Communication with Friends and Family: More than three times a week  . Frequency of Social Gatherings with Friends and Family: More than three times a week  . Attends Religious Services: More than 4 times per year  .  Active Member of Clubs or Organizations: No  . Attends Archivist Meetings: Never  . Marital Status: Widowed    Tobacco Counseling Counseling given: Not Answered   Clinical Intake:  Pre-visit preparation completed: Yes  Pain : No/denies pain     BMI - recorded: 30.24 Nutritional Status: BMI > 30  Obese Nutritional Risks: None Diabetes: No  How often do you need to have someone help you when you read instructions, pamphlets, or other written materials from your doctor or pharmacy?: 1 - Never    Interpreter Needed?: No  Information entered by :: Clemetine Marker LPN   Activities of Daily Living In your present state of health, do you have any difficulty performing the following activities: 09/05/2020  Hearing? N  Comment declines hearing aids  Vision? N  Difficulty concentrating or making decisions? N  Walking or climbing stairs? N  Dressing or bathing? N  Doing errands, shopping? N  Preparing Food and eating ? N  Using the Toilet? N  In the past six months, have you accidently leaked urine? N  Do you have problems with loss of bowel control? N  Managing your Medications? N  Managing your Finances? N  Housekeeping or managing your Housekeeping? N  Some recent data might be hidden    Patient Care Team: Juline Patch, MD as PCP - General (Family Medicine)  Indicate any recent Medical Services you may have received from other than [facility] providers in the past year (date may be approximate).     Assessment:   This is a routine wellness examination for Juan Moreno.  Hearing/Vision screen  Hearing Screening   125Hz  250Hz  500Hz  1000Hz  2000Hz  3000Hz  4000Hz  6000Hz  8000Hz   Right ear:           Left ear:           Comments: Pt wears hearing aids maintained by  ENT  Vision Screening Comments: Annual vision screenings done at [facility]  Dietary issues and exercise activities discussed: Current Exercise Habits: Home exercise routine, Type of  exercise: walking, Time (Minutes): 45, Frequency (Times/Week): 7, Weekly Exercise (Minutes/Week): 315, Intensity: Mild, Exercise limited by: None identified  Goals    . Patient Stated     Patient states he would like to maintain healthy weight and activity level      Depression Screen PHQ 2/9 Scores 09/05/2020 02/03/2020 09/02/2019 08/06/2019 04/01/2019 02/16/2019 03/20/2017  PHQ - 2 Score 0 0 0 0 0 0 0  PHQ- 9 Score - 0 - 0 0 0 -    Fall Risk Fall Risk  09/05/2020 02/03/2020 09/02/2019 08/06/2019 06/18/2019  Falls in the past year? 0 0 0 0 0  Comment - - - - Emmi Telephone Survey: data to providers prior to load  Number falls in past yr: 0 -  0 - -  Injury with Fall? 0 - 0 - -  Risk for fall due to : No Fall Risks - No Fall Risks - -  Follow up Falls prevention discussed Falls evaluation completed Falls prevention discussed Falls evaluation completed -    FALL RISK PREVENTION PERTAINING TO THE HOME:  Any stairs in or around the home? Yes  If so, are there any without handrails? Yes   - 2 steps outside  Home free of loose throw rugs in walkways, pet beds, electrical cords, etc? Yes  Adequate lighting in your home to reduce risk of falls? Yes   ASSISTIVE DEVICES UTILIZED TO PREVENT FALLS:  Life alert? No  Use of a cane, walker or w/c? No  Grab bars in the bathroom? No  Shower chair or bench in shower? Yes  Elevated toilet seat or a handicapped toilet? No   TIMED UP AND GO:  Was the test performed? Yes .  Length of time to ambulate 10 feet: 5 sec.   Gait steady and fast without use of assistive device  Cognitive Function: Normal cognitive status assessed by direct observation by this Nurse Health Advisor. No abnormalities found.       6CIT Screen 09/02/2019  What Year? 0 points  What month? 0 points  What time? 0 points  Count back from 20 0 points  Months in reverse 0 points  Repeat phrase 0 points  Total Score 0    Immunizations Immunization History  Administered Date(s)  Administered  . Fluad Quad(high Dose 65+) 04/01/2019  . Influenza, High Dose Seasonal PF 05/08/2018  . Influenza-Unspecified 04/29/2017, 04/29/2020  . PFIZER(Purple Top)SARS-COV-2 Vaccination 08/21/2019, 09/11/2019, 03/30/2020  . Pneumococcal Conjugate-13 09/19/2015  . Pneumococcal Polysaccharide-23 03/20/2017  . Tdap 09/19/2015    TDAP status: Up to date  Flu Vaccine status: Up to date  Pneumococcal vaccine status: Up to date  Covid-19 vaccine status: Completed vaccines  Qualifies for Shingles Vaccine? Yes   Zostavax completed No   Shingrix Completed?: No.    Education has been provided regarding the importance of this vaccine. Patient has been advised to call insurance company to determine out of pocket expense if they have not yet received this vaccine. Advised may also receive vaccine at local pharmacy or Health Dept. Verbalized acceptance and understanding.  Screening Tests Health Maintenance  Topic Date Due  . COLONOSCOPY (Pts 45-44yrs Insurance coverage will need to be confirmed)  12/26/2021  . TETANUS/TDAP  09/18/2025  . INFLUENZA VACCINE  Completed  . COVID-19 Vaccine  Completed  . Hepatitis C Screening  Completed  . PNA vac Low Risk Adult  Completed    Health Maintenance  There are no preventive care reminders to display for this patient.  Colorectal cancer screening: Type of screening: Colonoscopy. Completed 12/26/16. Repeat every 5 years  Lung Cancer Screening: (Low Dose CT Chest recommended if Age 15-80 years, 30 pack-year currently smoking OR have quit w/in 15years.) does not qualify.   Additional Screening:  Hepatitis C Screening: does qualify; Completed 09/17/16  Vision Screening: Recommended annual ophthalmology exams for early detection of glaucoma and other disorders of the eye. Is the patient up to date with their annual eye exam?  Yes  Who is the provider or what is the name of the office in which the patient attends annual eye exams? [facility] Screening: Recommended annual dental exams for proper oral hygiene  Community Resource Referral / Chronic Care Management: CRR required this visit?  No  CCM required this visit?  No      Plan:     I have personally reviewed and noted the following in the patient's chart:   . Medical and social history . Use of alcohol, tobacco or illicit drugs  . Current medications and supplements . Functional ability and status . Nutritional status . Physical activity . Advanced directives . List of other physicians . Hospitalizations, surgeries, and ER visits in previous 12 months . Vitals . Screenings to include cognitive, depression, and falls . Referrals and appointments  In addition, I have reviewed and discussed with patient certain preventive protocols, quality metrics, and best practice recommendations. A written personalized care plan for preventive services as well as general preventive health recommendations were provided to patient.     Clemetine Marker, LPN   04/01/4267   Nurse Notes: none

## 2020-11-01 ENCOUNTER — Emergency Department
Admission: EM | Admit: 2020-11-01 | Discharge: 2020-11-01 | Disposition: A | Discharge status: Home / Self Care | Payer: No Typology Code available for payment source | Attending: Emergency Medicine | Admitting: Emergency Medicine

## 2020-11-01 ENCOUNTER — Other Ambulatory Visit: Payer: Self-pay

## 2020-11-01 ENCOUNTER — Telehealth: Payer: Self-pay

## 2020-11-01 ENCOUNTER — Emergency Department: Payer: No Typology Code available for payment source

## 2020-11-01 DIAGNOSIS — R079 Chest pain, unspecified: Secondary | ICD-10-CM | POA: Diagnosis not present

## 2020-11-01 DIAGNOSIS — Z79899 Other long term (current) drug therapy: Secondary | ICD-10-CM | POA: Diagnosis not present

## 2020-11-01 DIAGNOSIS — S299XXA Unspecified injury of thorax, initial encounter: Secondary | ICD-10-CM | POA: Diagnosis present

## 2020-11-01 DIAGNOSIS — I2510 Atherosclerotic heart disease of native coronary artery without angina pectoris: Secondary | ICD-10-CM | POA: Diagnosis not present

## 2020-11-01 DIAGNOSIS — Y92410 Unspecified street and highway as the place of occurrence of the external cause: Secondary | ICD-10-CM | POA: Insufficient documentation

## 2020-11-01 DIAGNOSIS — S2020XA Contusion of thorax, unspecified, initial encounter: Secondary | ICD-10-CM | POA: Diagnosis not present

## 2020-11-01 DIAGNOSIS — Z87891 Personal history of nicotine dependence: Secondary | ICD-10-CM | POA: Insufficient documentation

## 2020-11-01 DIAGNOSIS — I700 Atherosclerosis of aorta: Secondary | ICD-10-CM | POA: Diagnosis not present

## 2020-11-01 DIAGNOSIS — R0789 Other chest pain: Secondary | ICD-10-CM | POA: Diagnosis not present

## 2020-11-01 DIAGNOSIS — S20219A Contusion of unspecified front wall of thorax, initial encounter: Secondary | ICD-10-CM

## 2020-11-01 DIAGNOSIS — I10 Essential (primary) hypertension: Secondary | ICD-10-CM | POA: Insufficient documentation

## 2020-11-01 LAB — COMPREHENSIVE METABOLIC PANEL
ALT: 23 U/L (ref 0–44)
AST: 28 U/L (ref 15–41)
Albumin: 4.7 g/dL (ref 3.5–5.0)
Alkaline Phosphatase: 86 U/L (ref 38–126)
Anion gap: 8 (ref 5–15)
BUN: 17 mg/dL (ref 8–23)
CO2: 26 mmol/L (ref 22–32)
Calcium: 9.4 mg/dL (ref 8.9–10.3)
Chloride: 101 mmol/L (ref 98–111)
Creatinine, Ser: 1.39 mg/dL — ABNORMAL HIGH (ref 0.61–1.24)
GFR, Estimated: 54 mL/min — ABNORMAL LOW (ref >60)
Glucose, Bld: 128 mg/dL — ABNORMAL HIGH (ref 70–99)
Potassium: 3.8 mmol/L (ref 3.5–5.1)
Sodium: 135 mmol/L (ref 135–145)
Total Bilirubin: 1.7 mg/dL — ABNORMAL HIGH (ref 0.3–1.2)
Total Protein: 8.4 g/dL — ABNORMAL HIGH (ref 6.5–8.1)

## 2020-11-01 LAB — CBC WITH DIFFERENTIAL/PLATELET
Abs Immature Granulocytes: 0.03 10*3/uL (ref 0.00–0.07)
Basophils Absolute: 0 10*3/uL (ref 0.0–0.1)
Basophils Relative: 0 %
Eosinophils Absolute: 0.1 10*3/uL (ref 0.0–0.5)
Eosinophils Relative: 1 %
HCT: 47 % (ref 39.0–52.0)
Hemoglobin: 16.3 g/dL (ref 13.0–17.0)
Immature Granulocytes: 0 %
Lymphocytes Relative: 18 %
Lymphs Abs: 2 10*3/uL (ref 0.7–4.0)
MCH: 29.4 pg (ref 26.0–34.0)
MCHC: 34.7 g/dL (ref 30.0–36.0)
MCV: 84.7 fL (ref 80.0–100.0)
Monocytes Absolute: 0.9 10*3/uL (ref 0.1–1.0)
Monocytes Relative: 8 %
Neutro Abs: 7.9 10*3/uL — ABNORMAL HIGH (ref 1.7–7.7)
Neutrophils Relative %: 73 %
Platelets: 265 10*3/uL (ref 150–400)
RBC: 5.55 MIL/uL (ref 4.22–5.81)
RDW: 13.7 % (ref 11.5–15.5)
WBC: 11 10*3/uL — ABNORMAL HIGH (ref 4.0–10.5)
nRBC: 0 % (ref 0.0–0.2)

## 2020-11-01 LAB — TROPONIN I (HIGH SENSITIVITY)
Troponin I (High Sensitivity): 3 ng/L (ref <18)
Troponin I (High Sensitivity): 4 ng/L (ref <18)

## 2020-11-01 MED ORDER — MELOXICAM 7.5 MG PO TABS: 7.5000 mg | ORAL_TABLET | Freq: Every day | ORAL | 0 refills | Status: DC

## 2020-11-01 MED ORDER — METHOCARBAMOL 500 MG PO TABS: 500.0000 mg | ORAL_TABLET | Freq: Four times a day (QID) | ORAL | 0 refills | Status: DC

## 2020-11-01 MED ORDER — IOHEXOL 300 MG/ML  SOLN
75.0000 mL | Freq: Once | INTRAMUSCULAR | Status: AC | PRN
  Administered 2020-11-01: 75 mL via INTRAVENOUS
  Filled 2020-11-01: qty 75
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
## 2020-11-01 NOTE — ED Notes (Signed)
EKG performed and given to [place] PA-C

## 2020-11-03 ENCOUNTER — Telehealth: Payer: Self-pay
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
## 2020-11-03 NOTE — Telephone Encounter (Signed)
Called pt and left a message for him to call back

## 2020-11-15 DIAGNOSIS — H353211 Exudative age-related macular degeneration, right eye, with active choroidal neovascularization: Secondary | ICD-10-CM | POA: Diagnosis not present

## 2020-11-17 DIAGNOSIS — Z23 Encounter for immunization: Secondary | ICD-10-CM | POA: Diagnosis not present

## 2021-01-16 DIAGNOSIS — Z85828 Personal history of other malignant neoplasm of skin: Secondary | ICD-10-CM | POA: Diagnosis not present

## 2021-01-16 DIAGNOSIS — Z86018 Personal history of other benign neoplasm: Secondary | ICD-10-CM | POA: Diagnosis not present

## 2021-01-16 DIAGNOSIS — L578 Other skin changes due to chronic exposure to nonionizing radiation: Secondary | ICD-10-CM | POA: Diagnosis not present

## 2021-01-16 DIAGNOSIS — Z859 Personal history of malignant neoplasm, unspecified: Secondary | ICD-10-CM | POA: Diagnosis not present

## 2021-01-16 DIAGNOSIS — Z872 Personal history of diseases of the skin and subcutaneous tissue: Secondary | ICD-10-CM | POA: Diagnosis not present

## 2021-01-16 DIAGNOSIS — L570 Actinic keratosis: Secondary | ICD-10-CM | POA: Diagnosis not present

## 2021-01-17 ENCOUNTER — Encounter: Payer: Self-pay | Admitting: Family Medicine

## 2021-01-17 ENCOUNTER — Ambulatory Visit (INDEPENDENT_AMBULATORY_CARE_PROVIDER_SITE_OTHER): Payer: Medicare Other | Admitting: Family Medicine

## 2021-01-17 ENCOUNTER — Other Ambulatory Visit: Payer: Self-pay

## 2021-01-17 VITALS — BP 130/80 | HR 72 | Ht 72.0 in | Wt 219.0 lb

## 2021-01-17 DIAGNOSIS — E782 Mixed hyperlipidemia: Secondary | ICD-10-CM

## 2021-01-17 DIAGNOSIS — K2100 Gastro-esophageal reflux disease with esophagitis, without bleeding: Secondary | ICD-10-CM | POA: Diagnosis not present

## 2021-01-17 DIAGNOSIS — I10 Essential (primary) hypertension: Secondary | ICD-10-CM

## 2021-01-17 MED ORDER — AMLODIPINE BESYLATE 5 MG PO TABS: 5.0000 mg | ORAL_TABLET | Freq: Every day | ORAL | 1 refills | Status: DC

## 2021-01-17 MED ORDER — METOPROLOL SUCCINATE ER 25 MG PO TB24: 25.0000 mg | ORAL_TABLET | Freq: Every day | ORAL | 1 refills | Status: DC

## 2021-01-17 MED ORDER — LOSARTAN POTASSIUM 50 MG PO TABS: ORAL_TABLET | ORAL | 0 refills | Status: DC

## 2021-01-17 MED ORDER — PANTOPRAZOLE SODIUM 40 MG PO TBEC: 40.0000 mg | DELAYED_RELEASE_TABLET | Freq: Every day | ORAL | 1 refills | Status: DC

## 2021-01-17 MED ORDER — ATORVASTATIN CALCIUM 10 MG PO TABS: ORAL_TABLET | ORAL | 1 refills | Status: DC

## 2021-01-17 NOTE — Progress Notes (Signed)
Date:  01/17/2021   Name:  Juan Moreno   DOB:  11-05-1950   MRN:  [id number]   Chief Complaint: Hypertension, Hyperlipidemia, and Gastroesophageal Reflux  Hypertension This is a chronic problem. The current episode started more than 1 year ago. The problem has been gradually improving since onset. The problem is controlled. Pertinent negatives include no anxiety, blurred vision, chest pain, headaches, malaise/fatigue, neck pain, orthopnea, palpitations, peripheral edema, PND, shortness of breath or sweats. There are no associated agents to hypertension. Risk factors for coronary artery disease include dyslipidemia and male gender. Past treatments include beta blockers, calcium channel blockers and angiotensin blockers. The current treatment provides moderate improvement. There are no compliance problems.  There is no history of angina, kidney disease, CAD/MI, CVA, heart failure, left ventricular hypertrophy, PVD or retinopathy. There is no history of chronic renal disease, a hypertension causing med or renovascular disease.  Hyperlipidemia This is a chronic problem. The current episode started more than 1 year ago. The problem is controlled. Recent lipid tests were reviewed and are normal. He has no history of chronic renal disease, diabetes, hypothyroidism, liver disease, obesity or nephrotic syndrome. Pertinent negatives include no chest pain, focal sensory loss, focal weakness, leg pain, myalgias or shortness of breath. Current antihyperlipidemic treatment includes statins. The current treatment provides no improvement of lipids. There are no compliance problems.  Risk factors for coronary artery disease include dyslipidemia and hypertension.  Gastroesophageal Reflux He reports no abdominal pain, no chest pain, no coughing, no dysphagia, no heartburn, no nausea, no sore throat or no wheezing. This is a recurrent problem. The problem occurs frequently. The problem has been waxing and  waning. The symptoms are aggravated by certain foods. Pertinent negatives include no anemia, fatigue, melena, muscle weakness, orthopnea or weight loss. He has tried a PPI for the symptoms. The treatment provided moderate relief.   Lab Results  Component Value Date   CREATININE 1.39 (H) 11/01/2020   BUN 17 11/01/2020   NA 135 11/01/2020   K 3.8 11/01/2020   CL 101 11/01/2020   CO2 26 11/01/2020   Lab Results  Component Value Date   CHOL 108 08/05/2020   HDL 40 08/05/2020   LDLCALC 46 08/05/2020   TRIG 126 08/05/2020   CHOLHDL 2.6 02/16/2019   Lab Results  Component Value Date   TSH 1.943 02/12/2018   Lab Results  Component Value Date   HGBA1C 5.8 (H) 08/06/2019   Lab Results  Component Value Date   WBC 11.0 (H) 11/01/2020   HGB 16.3 11/01/2020   HCT 47.0 11/01/2020   MCV 84.7 11/01/2020   PLT 265 11/01/2020   Lab Results  Component Value Date   ALT 23 11/01/2020   AST 28 11/01/2020   ALKPHOS 86 11/01/2020   BILITOT 1.7 (H) 11/01/2020     Review of Systems  Constitutional:  Negative for chills, fatigue, fever, malaise/fatigue and weight loss.  HENT:  Negative for drooling, ear discharge, ear pain and sore throat.   Eyes:  Negative for blurred vision.  Respiratory:  Negative for cough, shortness of breath and wheezing.   Cardiovascular:  Negative for chest pain, palpitations, orthopnea, leg swelling and PND.  Gastrointestinal:  Negative for abdominal pain, blood in stool, constipation, diarrhea, dysphagia, heartburn, melena and nausea.  Endocrine: Negative for polydipsia.  Genitourinary:  Negative for dysuria, frequency, hematuria and urgency.  Musculoskeletal:  Negative for back pain, myalgias, muscle weakness and neck pain.  Skin:  Negative for rash.  Allergic/Immunologic: Negative for environmental allergies.  Neurological:  Negative for dizziness, focal weakness and headaches.  Hematological:  Does not bruise/bleed easily.  Psychiatric/Behavioral:  Negative  for suicidal ideas. The patient is not nervous/anxious.    Patient Active Problem List   Diagnosis Date Noted   Vertigo 02/12/2018   Colon cancer screening 09/17/2016   Obesity 04/11/2015   Essential hypertension 03/21/2015   Hyperlipidemia 03/21/2015   Gastroesophageal reflux disease with esophagitis 03/21/2015    Allergies  Allergen Reactions   Penicillins Rash    Past Surgical History:  Procedure Laterality Date   COLONOSCOPY  2013   cleared for 4 yrs- Dr Vira Agar- Divert   COLONOSCOPY WITH PROPOFOL N/A 12/26/2016   Procedure: COLONOSCOPY WITH PROPOFOL;  Surgeon: Manya Silvas, MD;  Location: [facility] ENDOSCOPY;  Service: Endoscopy;  Laterality: N/A;   STAPEDES SURGERY      Social History   Tobacco Use   Smoking status: Never   Smokeless tobacco: Former    Types: Nurse, children's Use: Never used  Substance Use Topics   Alcohol use: Yes    Alcohol/week: 0.0 standard drinks    Comment: rare   Drug use: No     Medication list has been reviewed and updated.  Current Meds  Medication Sig   amLODipine (NORVASC) 5 MG tablet Take 1 tablet (5 mg total) by mouth daily.   atorvastatin (LIPITOR) 10 MG tablet TAKE 1 TABLET(10 MG) BY MOUTH DAILY AT 6 AM   loratadine (CLARITIN) 10 MG tablet Take 10 mg by mouth daily. otc   losartan (COZAAR) 50 MG tablet 1 tablet daily   metoprolol succinate (TOPROL-XL) 25 MG 24 hr tablet Take 1 tablet (25 mg total) by mouth daily.   pantoprazole (PROTONIX) 40 MG tablet Take 1 tablet (40 mg total) by mouth daily.   SODIUM FLUORIDE 5000 PLUS 1.1 % CREA dental cream    [DISCONTINUED] meloxicam (MOBIC) 7.5 MG tablet Take 1 tablet (7.5 mg total) by mouth daily.   [DISCONTINUED] methocarbamol (ROBAXIN) 500 MG tablet Take 1 tablet (500 mg total) by mouth 4 (four) times daily.    PHQ 2/9 Scores 09/05/2020 02/03/2020 09/02/2019 08/06/2019  PHQ - 2 Score 0 0 0 0  PHQ- 9 Score - 0 - 0    GAD 7 : Generalized Anxiety Score 02/03/2020 08/06/2019   Nervous, Anxious, on Edge 0 0  Control/stop worrying 0 0  Worry too much - different things 0 0  Trouble relaxing 0 0  Restless 0 0  Easily annoyed or irritable 0 0  Afraid - awful might happen 0 0  Total GAD 7 Score 0 0    BP Readings from Last 3 Encounters:  01/17/21 130/80  11/01/20 (!) 160/99  09/05/20 120/82    Physical Exam Vitals and nursing note reviewed.  HENT:     Head: Normocephalic.     Right Ear: Tympanic membrane, ear canal and external ear normal.     Left Ear: Tympanic membrane, ear canal and external ear normal.     Nose: Nose normal. No congestion or rhinorrhea.     Mouth/Throat:     Mouth: Mucous membranes are moist.  Eyes:     General: No scleral icterus.       Right eye: No discharge.        Left eye: No discharge.     Conjunctiva/sclera: Conjunctivae normal.     Pupils: Pupils are equal, round, and reactive to light.  Neck:  Thyroid: No thyromegaly.     Vascular: No JVD.     Trachea: No tracheal deviation.  Cardiovascular:     Rate and Rhythm: Normal rate and regular rhythm.     Heart sounds: Normal heart sounds. No murmur heard.   No friction rub. No gallop.  Pulmonary:     Effort: No respiratory distress.     Breath sounds: Normal breath sounds. No stridor. No wheezing, rhonchi or rales.  Chest:     Chest wall: No tenderness.  Abdominal:     General: Bowel sounds are normal.     Palpations: Abdomen is soft. There is no mass.     Tenderness: There is no abdominal tenderness. There is no guarding or rebound.  Musculoskeletal:        General: No tenderness. Normal range of motion.     Cervical back: Normal range of motion and neck supple.  Lymphadenopathy:     Cervical: No cervical adenopathy.  Skin:    General: Skin is warm.     Findings: No rash.  Neurological:     Mental Status: He is alert and oriented to person, place, and time.     Cranial Nerves: No cranial nerve deficit.     Deep Tendon Reflexes: Reflexes are normal and  symmetric.    Wt Readings from Last 3 Encounters:  01/17/21 219 lb (99.3 kg)  11/01/20 217 lb (98.4 kg)  09/05/20 223 lb (101.2 kg)    BP 130/80   Pulse 72   Ht 6' (1.829 m)   Wt 219 lb (99.3 kg)   BMI 29.70 kg/m   Assessment and Plan:  1. Essential hypertension Chronic.  Controlled.  Stable.  Blood pressure today is 130/80.  We will continue amlodipine 5 mg once a day losartan 50 mg once a day and Toprol-XL 25 mg once a day.  Review of previous renal panel is acceptable. - amLODipine (NORVASC) 5 MG tablet; Take 1 tablet (5 mg total) by mouth daily.  Dispense: 90 tablet; Refill: 1 - losartan (COZAAR) 50 MG tablet; 1 tablet daily  Dispense: 90 tablet; Refill: 0 - metoprolol succinate (TOPROL-XL) 25 MG 24 hr tablet; Take 1 tablet (25 mg total) by mouth daily.  Dispense: 90 tablet; Refill: 1  2. Mixed hyperlipidemia Chronic.  Controlled.  Stable.  Continue atorvastatin 10 mg once a day. - atorvastatin (LIPITOR) 10 MG tablet; TAKE 1 TABLET(10 MG) BY MOUTH DAILY AT 6 AM  Dispense: 90 tablet; Refill: 1  3. Gastroesophageal reflux disease with esophagitis Chronic.  Controlled.  Stable.  Continue pantoprazole 40 mg once a day. - pantoprazole (PROTONIX) 40 MG tablet; Take 1 tablet (40 mg total) by mouth daily.  Dispense: 90 tablet; Refill: 1

## 2021-01-23 ENCOUNTER — Other Ambulatory Visit: Payer: Self-pay

## 2021-01-23 DIAGNOSIS — I10 Essential (primary) hypertension: Secondary | ICD-10-CM

## 2021-01-23 MED ORDER — LOSARTAN POTASSIUM 50 MG PO TABS: ORAL_TABLET | ORAL | 0 refills | Status: DC

## 2021-01-27 ENCOUNTER — Other Ambulatory Visit: Payer: Self-pay | Admitting: Family Medicine

## 2021-01-27 DIAGNOSIS — I10 Essential (primary) hypertension: Secondary | ICD-10-CM

## 2021-01-31 DIAGNOSIS — H353211 Exudative age-related macular degeneration, right eye, with active choroidal neovascularization: Secondary | ICD-10-CM | POA: Diagnosis not present

## 2021-04-18 DIAGNOSIS — Z20822 Contact with and (suspected) exposure to covid-19: Secondary | ICD-10-CM | POA: Diagnosis not present

## 2021-04-18 DIAGNOSIS — H353211 Exudative age-related macular degeneration, right eye, with active choroidal neovascularization: Secondary | ICD-10-CM | POA: Diagnosis not present

## 2021-04-27 DIAGNOSIS — Z23 Encounter for immunization: Secondary | ICD-10-CM | POA: Diagnosis not present

## 2021-06-28 DIAGNOSIS — Z20822 Contact with and (suspected) exposure to covid-19: Secondary | ICD-10-CM | POA: Diagnosis not present

## 2021-07-11 DIAGNOSIS — H353211 Exudative age-related macular degeneration, right eye, with active choroidal neovascularization: Secondary | ICD-10-CM | POA: Diagnosis not present

## 2021-07-12 ENCOUNTER — Other Ambulatory Visit: Payer: Self-pay | Admitting: Family Medicine

## 2021-07-12 DIAGNOSIS — K2100 Gastro-esophageal reflux disease with esophagitis, without bleeding: Secondary | ICD-10-CM

## 2021-07-12 DIAGNOSIS — E782 Mixed hyperlipidemia: Secondary | ICD-10-CM

## 2021-07-12 NOTE — Telephone Encounter (Signed)
Requested Prescriptions  Pending Prescriptions Disp Refills   atorvastatin (LIPITOR) 10 MG tablet [Pharmacy Med Name: ATORVASTATIN 10MG  TABLETS] 90 tablet 1    Sig: TAKE 1 TABLET(10 MG) BY MOUTH DAILY AT 6 AM     Cardiovascular:  Antilipid - Statins Passed - 07/12/2021  6:20 AM      Passed - Total Cholesterol in normal range and within 360 days    Cholesterol, Total  Date Value Ref Range Status  08/05/2020 108 100 - 199 mg/dL Final         Passed - LDL in normal range and within 360 days    LDL Chol Calc (NIH)  Date Value Ref Range Status  08/05/2020 46 0 - 99 mg/dL Final         Passed - HDL in normal range and within 360 days    HDL  Date Value Ref Range Status  08/05/2020 40 >39 mg/dL Final         Passed - Triglycerides in normal range and within 360 days    Triglycerides  Date Value Ref Range Status  08/05/2020 126 0 - 149 mg/dL Final         Passed - Patient is not pregnant      Passed - Valid encounter within last 12 months    Recent Outpatient Visits          5 months ago Essential hypertension   [place], Deanna C, MD   11 months ago Essential hypertension   [place], Deanna C, MD   1 year ago Essential hypertension   [place], Deanna C, MD   1 year ago Essential hypertension   [place], Deanna C, MD   2 years ago Cervical herniation   [facility] Juline Patch, MD      Future Appointments            In 1 week Juline Patch, MD [facility]            pantoprazole (PROTONIX) 40 MG tablet [Pharmacy Med Name: PANTOPRAZOLE 40MG  TABLETS] 90 tablet 1    Sig: TAKE 1 TABLET(40 MG) BY MOUTH DAILY     Gastroenterology: Proton Pump Inhibitors Passed - 07/12/2021  6:20 AM      Passed - Valid encounter within last 12 months    Recent Outpatient Visits          5 months ago Essential hypertension   [place], Deanna C, MD   11 months  ago Essential hypertension   [place], Deanna C, MD   1 year ago Essential hypertension   [place], Deanna C, MD   1 year ago Essential hypertension   [facility] Juline Patch, MD   2 years ago Cervical herniation   [facility] Juline Patch, MD      Future Appointments            In 1 week Juline Patch, MD [facility]

## 2021-07-20 ENCOUNTER — Telehealth: Payer: Self-pay | Admitting: Family Medicine

## 2021-07-20 ENCOUNTER — Other Ambulatory Visit: Payer: Self-pay

## 2021-07-20 ENCOUNTER — Ambulatory Visit (INDEPENDENT_AMBULATORY_CARE_PROVIDER_SITE_OTHER): Payer: Medicare Other | Admitting: Family Medicine

## 2021-07-20 ENCOUNTER — Encounter: Payer: Self-pay | Admitting: Family Medicine

## 2021-07-20 VITALS — BP 110/86 | HR 80 | Ht 72.0 in | Wt 223.0 lb

## 2021-07-20 DIAGNOSIS — E782 Mixed hyperlipidemia: Secondary | ICD-10-CM

## 2021-07-20 DIAGNOSIS — K2100 Gastro-esophageal reflux disease with esophagitis, without bleeding: Secondary | ICD-10-CM

## 2021-07-20 DIAGNOSIS — I10 Essential (primary) hypertension: Secondary | ICD-10-CM | POA: Diagnosis not present

## 2021-07-20 DIAGNOSIS — R7303 Prediabetes: Secondary | ICD-10-CM

## 2021-07-20 MED ORDER — METOPROLOL SUCCINATE ER 25 MG PO TB24: ORAL_TABLET | ORAL | 1 refills | Status: DC

## 2021-07-20 MED ORDER — PANTOPRAZOLE SODIUM 40 MG PO TBEC: DELAYED_RELEASE_TABLET | ORAL | 1 refills | Status: DC

## 2021-07-20 MED ORDER — AMLODIPINE BESYLATE 5 MG PO TABS: 5.0000 mg | ORAL_TABLET | Freq: Every day | ORAL | 1 refills | Status: DC

## 2021-07-20 MED ORDER — LOSARTAN POTASSIUM 50 MG PO TABS: ORAL_TABLET | ORAL | 0 refills | Status: DC

## 2021-07-20 MED ORDER — ATORVASTATIN CALCIUM 10 MG PO TABS: ORAL_TABLET | ORAL | 1 refills | Status: DC

## 2021-07-20 NOTE — Telephone Encounter (Signed)
Patient called in states his shingles vaccin is over 11yrs so pharmacy didnt have the info and we would have to t on ncir website to get the info

## 2021-07-20 NOTE — Progress Notes (Signed)
Date:  07/20/2021   Name:  Juan Moreno   DOB:  [birth year]   MRN:  [id number]   Chief Complaint: Hypertension, Hyperlipidemia, Gastroesophageal Reflux, and Prediabetes  Hypertension This is a chronic problem. The current episode started more than 1 year ago. The problem has been gradually improving since onset. The problem is controlled. Pertinent negatives include no anxiety, blurred vision, chest pain, headaches, malaise/fatigue, neck pain, orthopnea, palpitations, peripheral edema, PND, shortness of breath or sweats. There are no associated agents to hypertension. Risk factors for coronary artery disease include dyslipidemia (prediabetes). Past treatments include calcium channel blockers, angiotensin blockers and beta blockers. The current treatment provides moderate improvement. There are no compliance problems.  There is no history of angina, kidney disease, CAD/MI, CVA, heart failure, left ventricular hypertrophy, PVD or retinopathy. There is no history of chronic renal disease or a hypertension causing med.  Hyperlipidemia This is a chronic problem. The current episode started more than 1 year ago. The problem is controlled. Recent lipid tests were reviewed and are normal. He has no history of chronic renal disease or diabetes. Pertinent negatives include no chest pain, myalgias or shortness of breath. Current antihyperlipidemic treatment includes statins. The current treatment provides moderate improvement of lipids. There are no compliance problems.   Gastroesophageal Reflux He reports no abdominal pain, no chest pain, no coughing, no dysphagia, no nausea, no sore throat or no wheezing. This is a chronic problem. Pertinent negatives include no fatigue. He has tried a PPI for the symptoms.  Diabetes He presents for his follow-up diabetic visit. He has type 2 diabetes mellitus. His disease course has been stable. Pertinent negatives for hypoglycemia include no dizziness, headaches,  nervousness/anxiousness or sweats. Pertinent negatives for diabetes include no blurred vision, no chest pain, no fatigue, no polydipsia and no polyuria. Symptoms are stable. Pertinent negatives for diabetic complications include no CVA, PVD or retinopathy. Risk factors for coronary artery disease include dyslipidemia. His weight is stable. He is following a generally healthy diet. Meal planning includes avoidance of concentrated sweets and carbohydrate counting. He participates in exercise intermittently. An ACE inhibitor/angiotensin II receptor blocker is being taken.   Lab Results  Component Value Date   NA 135 11/01/2020   K 3.8 11/01/2020   CO2 26 11/01/2020   GLUCOSE 128 (H) 11/01/2020   BUN 17 11/01/2020   CREATININE 1.39 (H) 11/01/2020   CALCIUM 9.4 11/01/2020   GFRNONAA 54 (L) 11/01/2020   Lab Results  Component Value Date   CHOL 108 08/05/2020   HDL 40 08/05/2020   LDLCALC 46 08/05/2020   TRIG 126 08/05/2020   CHOLHDL 2.6 02/16/2019   Lab Results  Component Value Date   TSH 1.943 02/12/2018   Lab Results  Component Value Date   HGBA1C 5.8 (H) 08/06/2019   Lab Results  Component Value Date   WBC 11.0 (H) 11/01/2020   HGB 16.3 11/01/2020   HCT 47.0 11/01/2020   MCV 84.7 11/01/2020   PLT 265 11/01/2020   Lab Results  Component Value Date   ALT 23 11/01/2020   AST 28 11/01/2020   ALKPHOS 86 11/01/2020   BILITOT 1.7 (H) 11/01/2020   No results found for: 25OHVITD2, 25OHVITD3, VD25OH   Review of Systems  Constitutional:  Negative for chills, fatigue, fever and malaise/fatigue.  HENT:  Negative for drooling, ear discharge, ear pain and sore throat.   Eyes:  Negative for blurred vision.  Respiratory:  Negative for cough, shortness of breath and wheezing.  Cardiovascular:  Negative for chest pain, palpitations, orthopnea, leg swelling and PND.  Gastrointestinal:  Negative for abdominal pain, blood in stool, constipation, diarrhea, dysphagia and nausea.   Endocrine: Negative for polydipsia and polyuria.  Genitourinary:  Negative for dysuria, frequency, hematuria and urgency.  Musculoskeletal:  Negative for back pain, myalgias and neck pain.  Skin:  Negative for rash.  Allergic/Immunologic: Negative for environmental allergies.  Neurological:  Negative for dizziness and headaches.  Hematological:  Does not bruise/bleed easily.  Psychiatric/Behavioral:  Negative for suicidal ideas. The patient is not nervous/anxious.    Patient Active Problem List   Diagnosis Date Noted   Vertigo 02/12/2018   Colon cancer screening 09/17/2016   Obesity 04/11/2015   Essential hypertension 03/21/2015   Hyperlipidemia 03/21/2015   Gastroesophageal reflux disease with esophagitis 03/21/2015    Allergies  Allergen Reactions   Penicillins Rash    Past Surgical History:  Procedure Laterality Date   COLONOSCOPY  2013   cleared for 4 yrs- Dr Vira Agar- Divert   COLONOSCOPY WITH PROPOFOL N/A 12/26/2016   Procedure: COLONOSCOPY WITH PROPOFOL;  Surgeon: Manya Silvas, MD;  Location: [facility] ENDOSCOPY;  Service: Endoscopy;  Laterality: N/A;   STAPEDES SURGERY      Social History   Tobacco Use   Smoking status: Never   Smokeless tobacco: Former    Types: Nurse, children's Use: Never used  Substance Use Topics   Alcohol use: Yes    Alcohol/week: 0.0 standard drinks    Comment: rare   Drug use: No     Medication list has been reviewed and updated.  Current Meds  Medication Sig   amLODipine (NORVASC) 5 MG tablet Take 1 tablet (5 mg total) by mouth daily.   atorvastatin (LIPITOR) 10 MG tablet TAKE 1 TABLET(10 MG) BY MOUTH DAILY AT 6 AM   loratadine (CLARITIN) 10 MG tablet Take 10 mg by mouth daily. otc   losartan (COZAAR) 50 MG tablet 1 tablet daily   metoprolol succinate (TOPROL-XL) 25 MG 24 hr tablet TAKE 1 TABLET(25 MG) BY MOUTH DAILY   pantoprazole (PROTONIX) 40 MG tablet TAKE 1 TABLET(40 MG) BY MOUTH DAILY   SODIUM FLUORIDE 5000  PLUS 1.1 % CREA dental cream     PHQ 2/9 Scores 09/05/2020 02/03/2020 09/02/2019 08/06/2019  PHQ - 2 Score 0 0 0 0  PHQ- 9 Score - 0 - 0    GAD 7 : Generalized Anxiety Score 02/03/2020 08/06/2019  Nervous, Anxious, on Edge 0 0  Control/stop worrying 0 0  Worry too much - different things 0 0  Trouble relaxing 0 0  Restless 0 0  Easily annoyed or irritable 0 0  Afraid - awful might happen 0 0  Total GAD 7 Score 0 0    BP Readings from Last 3 Encounters:  07/20/21 110/86  01/17/21 130/80  11/01/20 (!) 160/99    Physical Exam Vitals and nursing note reviewed.  HENT:     Head: Normocephalic.     Right Ear: Tympanic membrane, ear canal and external ear normal. There is no impacted cerumen.     Left Ear: Tympanic membrane, ear canal and external ear normal. There is no impacted cerumen.     Nose: Nose normal. No congestion or rhinorrhea.  Eyes:     General: No scleral icterus.       Right eye: No discharge.        Left eye: No discharge.     Conjunctiva/sclera: Conjunctivae normal.  Pupils: Pupils are equal, round, and reactive to light.  Neck:     Thyroid: No thyromegaly.     Vascular: No JVD.     Trachea: No tracheal deviation.  Cardiovascular:     Rate and Rhythm: Normal rate and regular rhythm.     Heart sounds: Normal heart sounds. No murmur heard.   No friction rub. No gallop.  Pulmonary:     Effort: No respiratory distress.     Breath sounds: Normal breath sounds. No wheezing, rhonchi or rales.  Abdominal:     General: Bowel sounds are normal.     Palpations: Abdomen is soft. There is no mass.     Tenderness: There is no abdominal tenderness. There is no guarding or rebound.  Musculoskeletal:        General: No tenderness. Normal range of motion.     Cervical back: Normal range of motion and neck supple.  Lymphadenopathy:     Cervical: No cervical adenopathy.  Skin:    General: Skin is warm.     Findings: No rash.  Neurological:     Mental Status: He is alert and  oriented to person, place, and time.     Cranial Nerves: No cranial nerve deficit.     Deep Tendon Reflexes: Reflexes are normal and symmetric.    Wt Readings from Last 3 Encounters:  07/20/21 223 lb (101.2 kg)  01/17/21 219 lb (99.3 kg)  11/01/20 217 lb (98.4 kg)    BP 110/86    Pulse 80    Ht 6' (1.829 m)    Wt 223 lb (101.2 kg)    BMI 30.24 kg/m   Assessment and Plan:  1. Essential hypertension Chronic.  Controlled.  Stable.  Course of disease is improving.  Blood pressure today is 110/86.  Continue amlodipine 5 mg once a day, losartan 50 mg once a day, and metoprolol XL 50 mg once a day.  Will check CMP for electrolytes and GFR status. - amLODipine (NORVASC) 5 MG tablet; Take 1 tablet (5 mg total) by mouth daily.  Dispense: 90 tablet; Refill: 1 - losartan (COZAAR) 50 MG tablet; 1 tablet daily  Dispense: 90 tablet; Refill: 0 - metoprolol succinate (TOPROL-XL) 25 MG 24 hr tablet; TAKE 1 TABLET(25 MG) BY MOUTH DAILY  Dispense: 90 tablet; Refill: 1 - Comprehensive Metabolic Panel (CMET)  2. Mixed hyperlipidemia Chronic.  Controlled.  Stable.  Continue atorvastatin 10 mg once a day.  Will check lipid panel for current status of LDL - atorvastatin (LIPITOR) 10 MG tablet; Take 1 tablet daily  Dispense: 90 tablet; Refill: 1 - Lipid Panel With LDL/HDL Ratio  3. Gastroesophageal reflux disease with esophagitis Chronic.  Controlled.  Stable.  Continue pantoprazole 40 mg once a day. - pantoprazole (PROTONIX) 40 MG tablet; TAKE 1 TABLET(40 MG) BY MOUTH DAILY  Dispense: 90 tablet; Refill: 1  4. Prediabetes Diet controlled.  Stable.  No symptomatology of polyuria polydipsia.  Will check A1c for current status and encourage continued dietary approach which he is doing very well. - HgB A1c

## 2021-07-20 NOTE — Telephone Encounter (Signed)
Patient called about the atorvastatin and he says he already spoke to the pharmacist and was told it's the same medication.

## 2021-07-20 NOTE — Telephone Encounter (Signed)
Called pt let him know that he could call [facility] to see if he could get his shot record from there.  KP

## 2021-07-20 NOTE — Telephone Encounter (Signed)
Patient called in aboutAtorvastatin he received looks like different packaging, said 10mg  tablet andit didn't say that before, so he wants to make sure its the same as before. Please call back

## 2021-07-21 LAB — LIPID PANEL WITH LDL/HDL RATIO
Cholesterol, Total: 123 mg/dL (ref 100–199)
HDL: 43 mg/dL (ref >39)
LDL Chol Calc (NIH): 59 mg/dL (ref 0–99)
LDL/HDL Ratio: 1.4 ratio (ref 0.0–3.6)
Triglycerides: 115 mg/dL (ref 0–149)
VLDL Cholesterol Cal: 21 mg/dL (ref 5–40)

## 2021-07-21 LAB — COMPREHENSIVE METABOLIC PANEL
ALT: 27 IU/L (ref 0–44)
AST: 32 IU/L (ref 0–40)
Albumin/Globulin Ratio: 1.6 (ref 1.2–2.2)
Albumin: 4.5 g/dL (ref 3.7–4.7)
Alkaline Phosphatase: 90 IU/L (ref 44–121)
BUN/Creatinine Ratio: 13 (ref 10–24)
BUN: 15 mg/dL (ref 8–27)
Bilirubin Total: 1.5 mg/dL — ABNORMAL HIGH (ref 0.0–1.2)
CO2: 23 mmol/L (ref 20–29)
Calcium: 9.6 mg/dL (ref 8.6–10.2)
Chloride: 102 mmol/L (ref 96–106)
Creatinine, Ser: 1.17 mg/dL (ref 0.76–1.27)
Globulin, Total: 2.8 g/dL (ref 1.5–4.5)
Glucose: 92 mg/dL (ref 70–99)
Potassium: 4.5 mmol/L (ref 3.5–5.2)
Sodium: 141 mmol/L (ref 134–144)
Total Protein: 7.3 g/dL (ref 6.0–8.5)
eGFR: 66 mL/min/{1.73_m2} (ref >59)

## 2021-07-21 LAB — HEMOGLOBIN A1C
Est. average glucose Bld gHb Est-mCnc: 123 mg/dL
Hgb A1c MFr Bld: 5.9 % — ABNORMAL HIGH (ref 4.8–5.6)

## 2021-07-25 NOTE — Telephone Encounter (Signed)
Pt called back saying he had the Shinges vaccines were 10/18/2016 second 01/08/2017  Covid booster 9/292022  CB#  [id number]

## 2021-08-31 DIAGNOSIS — Z20822 Contact with and (suspected) exposure to covid-19: Secondary | ICD-10-CM | POA: Diagnosis not present

## 2021-09-06 ENCOUNTER — Ambulatory Visit (INDEPENDENT_AMBULATORY_CARE_PROVIDER_SITE_OTHER): Payer: Medicare Other

## 2021-09-06 ENCOUNTER — Other Ambulatory Visit: Payer: Self-pay

## 2021-09-06 DIAGNOSIS — Z1211 Encounter for screening for malignant neoplasm of colon: Secondary | ICD-10-CM

## 2021-09-06 DIAGNOSIS — Z86010 Personal history of colonic polyps: Secondary | ICD-10-CM

## 2021-09-06 DIAGNOSIS — Z0000 Encounter for general adult medical examination without abnormal findings: Secondary | ICD-10-CM

## 2021-09-06 MED ORDER — NA SULFATE-K SULFATE-MG SULF 17.5-3.13-1.6 GM/177ML PO SOLN: 1.0000 | Freq: Once | ORAL | 0 refills | Status: AC

## 2021-09-06 NOTE — Progress Notes (Signed)
Subjective:   Juan Moreno is a 71 y.o. male who presents for Medicare Annual/Subsequent preventive examination.  Virtual Visit via Telephone Note  I connected with  Juan Moreno on 09/06/21 at  9:20 AM EST by telephone and verified that I am speaking with the correct person using two identifiers.  Location: Patient: home Provider: [facility] Persons participating in the virtual visit: [place]   I discussed the limitations, risks, security and privacy concerns of performing an evaluation and management service by telephone and the availability of in person appointments. The patient expressed understanding and agreed to proceed.  Interactive audio and video telecommunications were attempted between this nurse and patient, however failed, due to patient having technical difficulties OR patient did not have access to video capability.  We continued and completed visit with audio only.  Some vital signs may be absent or patient reported.   Clemetine Marker, LPN   Review of Systems     Cardiac Risk Factors include: advanced age (>14men, >50 women);dyslipidemia;male gender;hypertension     Objective:    There were no vitals filed for this visit. There is no height or weight on file to calculate BMI.  Advanced Directives 09/06/2021 11/01/2020 09/05/2020 09/02/2019 05/06/2018 02/12/2018 08/09/2017  Does Patient Have a Medical Advance Directive? Yes No No No No No No  Type of Paramedic of [place];Living will - - - - - -  Copy of [place] in Chart? Yes - validated most recent copy scanned in chart (See row information) - - - - - -  Would patient like information on creating a medical advance directive? - - Yes (MAU/Ambulatory/Procedural Areas - Information given) Yes (MAU/Ambulatory/Procedural Areas - Information given) - Yes (Inpatient - patient requests chaplain consult to create a medical advance directive) -     Current Medications (verified) Outpatient Encounter Medications as of 09/06/2021  Medication Sig   amLODipine (NORVASC) 5 MG tablet Take 1 tablet (5 mg total) by mouth daily.   atorvastatin (LIPITOR) 10 MG tablet Take 1 tablet daily   loratadine (CLARITIN) 10 MG tablet Take 10 mg by mouth daily. otc   losartan (COZAAR) 50 MG tablet 1 tablet daily   metoprolol succinate (TOPROL-XL) 25 MG 24 hr tablet TAKE 1 TABLET(25 MG) BY MOUTH DAILY   pantoprazole (PROTONIX) 40 MG tablet TAKE 1 TABLET(40 MG) BY MOUTH DAILY   PREVIDENT 5000 BOOSTER PLUS 1.1 % PSTE Place onto teeth.   [DISCONTINUED] SODIUM FLUORIDE 5000 PLUS 1.1 % CREA dental cream    No facility-administered encounter medications on file as of 09/06/2021.    Allergies (verified) Penicillins   History: Past Medical History:  Diagnosis Date   GERD (gastroesophageal reflux disease)    Hyperlipidemia    Hypertension    Retinopathy of right eye    Past Surgical History:  Procedure Laterality Date   COLONOSCOPY  2013   cleared for 4 yrs- Dr Vira Agar- [place]   COLONOSCOPY WITH PROPOFOL N/A 12/26/2016   Procedure: COLONOSCOPY WITH PROPOFOL;  Surgeon: Manya Silvas, MD;  Location: [facility] ENDOSCOPY;  Service: Endoscopy;  Laterality: N/A;   STAPEDES SURGERY     Family History  Problem Relation Age of Onset   Hypertension Mother    Hypertension Father    Social History   Socioeconomic History   Marital status: Widowed    Spouse name: Not on file   Number of children: 0   Years of education: Not on file   Highest  education level: Not on file  Occupational History   Not on file  Tobacco Use   Smoking status: Never   Smokeless tobacco: Former    Types: Chew  Vaping Use   Vaping Use: Never used  Substance and Sexual Activity   Alcohol use: Yes    Alcohol/week: 0.0 standard drinks    Comment: rare   Drug use: No   Sexual activity: Never  Other Topics Concern   Not on file  Social History Narrative   Pt lives alone    Social Determinants of Health   Financial Resource Strain: Low Risk    Difficulty of Paying Living Expenses: Not hard at all  Food Insecurity: No Food Insecurity   Worried About Charity fundraiser in the Last Year: Never true   [place] in the Last Year: Never true  Transportation Needs: No Transportation Needs   Lack of Transportation (Medical): No   Lack of Transportation (Non-Medical): No  Physical Activity: Sufficiently Active   Days of Exercise per Week: 7 days   Minutes of Exercise per Session: 40 min  Stress: No Stress Concern Present   Feeling of Stress : Not at all  Social Connections: Moderately Isolated   Frequency of Communication with Friends and Family: More than three times a week   Frequency of Social Gatherings with Friends and Family: More than three times a week   Attends Religious Services: More than 4 times per year   Active Member of [employer] or Organizations: No   Attends Archivist Meetings: Never   Marital Status: Widowed    Tobacco Counseling Counseling given: Not Answered   Clinical Intake:  Pre-visit preparation completed: Yes  Pain : No/denies pain     Nutritional Risks: None Diabetes: No  How often do you need to have someone help you when you read instructions, pamphlets, or other written materials from your doctor or pharmacy?: 1 - Never    Interpreter Needed?: No  Information entered by :: Clemetine Marker LPN   Activities of Daily Living In your present state of health, do you have any difficulty performing the following activities: 09/06/2021  Hearing? Y  Comment wears hearing aids  Vision? N  Difficulty concentrating or making decisions? N  Walking or climbing stairs? N  Dressing or bathing? N  Doing errands, shopping? N  Preparing Food and eating ? N  Using the Toilet? N  In the past six months, have you accidently leaked urine? N  Do you have problems with loss of bowel control? N  Managing your  Medications? N  Managing your Finances? N  Housekeeping or managing your Housekeeping? N  Some recent data might be hidden    Patient Care Team: Juline Patch, MD as PCP - General (Family Medicine)  Indicate any recent Medical Services you may have received from other than [facility] providers in the past year (date may be approximate).     Assessment:   This is a routine wellness examination for Juan Moreno.  Hearing/Vision screen Hearing Screening - Comments:: Pt wears hearing aids maintained by [facility] Vision Screening - Comments:: Annual vision screenings done at [place] issues and exercise activities discussed: Current Exercise Habits: Home exercise routine, Type of exercise: walking, Time (Minutes): 40, Frequency (Times/Week): 7, Weekly Exercise (Minutes/Week): 280, Intensity: Mild, Exercise limited by: None identified   Goals Addressed   None    Depression Screen [facility] 2/9 Scores 09/06/2021 09/05/2020 02/03/2020 09/02/2019 08/06/2019  04/01/2019 02/16/2019  PHQ - 2 Score 0 0 0 0 0 0 0  PHQ- 9 Score - - 0 - 0 0 0    Fall Risk Fall Risk  09/06/2021 09/05/2020 02/03/2020 09/02/2019 08/06/2019  Falls in the past year? 0 0 0 0 0  Comment - - - - -  Number falls in past yr: 0 0 - 0 -  Injury with Fall? 0 0 - 0 -  Risk for fall due to : No Fall Risks No Fall Risks - No Fall Risks -  Follow up Falls prevention discussed Falls prevention discussed Falls evaluation completed Falls prevention discussed Falls evaluation completed    FALL RISK PREVENTION PERTAINING TO THE HOME:  Any stairs in or around the home? No If so, are there any without handrails? No  Home free of loose throw rugs in walkways, pet beds, electrical cords, etc? Yes  Adequate lighting in your home to reduce risk of falls? Yes   ASSISTIVE DEVICES UTILIZED TO PREVENT FALLS:  Life alert? No  Use of a cane, walker or w/c? No  Grab bars in the bathroom? No  Shower chair or bench in shower? Yes  Elevated toilet  seat or a handicapped toilet? No   TIMED UP AND GO:  Was the test performed? No . Telephonic visit.   Cognitive Function: Normal cognitive status assessed by direct observation by this Nurse Health Advisor. No abnormalities found.       6CIT Screen 09/02/2019  What Year? 0 points  What month? 0 points  What time? 0 points  Count back from 20 0 points  Months in reverse 0 points  Repeat phrase 0 points  Total Score 0    Immunizations Immunization History  Administered Date(s) Administered   Fluad Quad(high Dose 65+) 04/01/2019   Influenza, High Dose Seasonal PF 05/08/2018   Influenza-Unspecified 04/29/2017, 04/29/2020, 04/26/2021   PFIZER(Purple Top)SARS-COV-2 Vaccination 08/21/2019, 09/11/2019, 03/30/2020, 11/24/2020, 04/26/2021   Pneumococcal Conjugate-13 09/19/2015   Pneumococcal Polysaccharide-23 03/20/2017   Tdap 09/19/2015   Zoster Recombinat (Shingrix) 10/18/2016, 01/08/2017    TDAP status: Up to date  Flu Vaccine status: Up to date  Pneumococcal vaccine status: Up to date  Covid-19 vaccine status: Completed vaccines  Qualifies for Shingles Vaccine? Yes   Zostavax completed No   Shingrix Completed?: Yes  Screening Tests Health Maintenance  Topic Date Due   COLONOSCOPY (Pts 45-75yrs Insurance coverage will need to be confirmed)  12/26/2021   TETANUS/TDAP  09/18/2025   Pneumonia Vaccine 60+ Years old  Completed   INFLUENZA VACCINE  Completed   COVID-19 Vaccine  Completed   Hepatitis C Screening  Completed   Zoster Vaccines- Shingrix  Completed   HPV VACCINES  Aged Out    Health Maintenance  There are no preventive care reminders to display for this patient.  Colorectal cancer screening: Type of screening: Colonoscopy. Completed 12/26/16. Repeat every 5 years  Lung Cancer Screening: (Low Dose CT Chest recommended if Age 30-80 years, 30 pack-year currently smoking OR have quit w/in 15years.) does not qualify.   Additional Screening:  Hepatitis C  Screening: does qualify; Completed 09/17/16  Vision Screening: Recommended annual ophthalmology exams for early detection of glaucoma and other disorders of the eye. Is the patient up to date with their annual eye exam?  Yes  Who is the provider or what is the name of the office in which the patient attends annual eye exams? [facility].   Dental Screening: Recommended annual dental exams  for proper oral hygiene  Community Resource Referral / Chronic Care Management: CRR required this visit?  No   CCM required this visit?  No      Plan:     I have personally reviewed and noted the following in the patients chart:   Medical and social history Use of alcohol, tobacco or illicit drugs  Current medications and supplements including opioid prescriptions. Patient is not currently taking opioid prescriptions. Functional ability and status Nutritional status Physical activity Advanced directives List of other physicians Hospitalizations, surgeries, and ER visits in previous 12 months Vitals Screenings to include cognitive, depression, and falls Referrals and appointments  In addition, I have reviewed and discussed with patient certain preventive protocols, quality metrics, and best practice recommendations. A written personalized care plan for preventive services as well as general preventive health recommendations were provided to patient.   Due to this being a telephonic visit, the after visit summary with patients personalized plan was offered to patient via my-chart.   Clemetine Marker, LPN   11/06/4494   Nurse Notes: pt aware due for repeat hepatic panel in March

## 2021-09-06 NOTE — Progress Notes (Signed)
Gastroenterology Pre-Procedure Review  Request Date: 12/21/2021 Requesting Physician: Dr. Marius Ditch  PATIENT REVIEW QUESTIONS: The patient responded to the following health history questions as indicated:    1. Are you having any GI issues? no 2. Do you have a personal history of Polyps? yes (LAST COLONOSCOPY) 3. Do you have a family history of Colon Cancer or Polyps? no 4. Diabetes Mellitus? no 5. Joint replacements in the past 12 months?no 6. Major health problems in the past 3 months?no 7. Any artificial heart valves, MVP, or defibrillator?no    MEDICATIONS & ALLERGIES:    Patient reports the following regarding taking any anticoagulation/antiplatelet therapy:   Plavix, Coumadin, Eliquis, Xarelto, Lovenox, Pradaxa, Brilinta, or Effient? no Aspirin? no  Patient confirms/reports the following medications:  Current Outpatient Medications  Medication Sig Dispense Refill   amLODipine (NORVASC) 5 MG tablet Take 1 tablet (5 mg total) by mouth daily. 90 tablet 1   atorvastatin (LIPITOR) 10 MG tablet Take 1 tablet daily 90 tablet 1   loratadine (CLARITIN) 10 MG tablet Take 10 mg by mouth daily. otc     losartan (COZAAR) 50 MG tablet 1 tablet daily 90 tablet 0   metoprolol succinate (TOPROL-XL) 25 MG 24 hr tablet TAKE 1 TABLET(25 MG) BY MOUTH DAILY 90 tablet 1   pantoprazole (PROTONIX) 40 MG tablet TAKE 1 TABLET(40 MG) BY MOUTH DAILY 90 tablet 1   PREVIDENT 5000 BOOSTER PLUS 1.1 % PSTE Place onto teeth.     No current facility-administered medications for this visit.    Patient confirms/reports the following allergies:  Allergies  Allergen Reactions   Penicillins Rash    No orders of the defined types were placed in this encounter.   AUTHORIZATION INFORMATION Primary Insurance: 1D#: Group #:  Secondary Insurance: 1D#: Group #:  SCHEDULE INFORMATION: Date: 12/21/2021 Time: Location:[facility]

## 2021-09-06 NOTE — Patient Instructions (Signed)
[name] , Thank you for taking time to come for your Medicare Wellness Visit. I appreciate your ongoing commitment to your health goals. Please review the following plan we discussed and let me know if I can assist you in the future.   Screening recommendations/referrals: Colonoscopy: done 12/26/16. Referral sent to [facility] today for repeat screening colonoscopy. They will contact you for an appointment.  Recommended yearly ophthalmology/optometry visit for glaucoma screening and checkup Recommended yearly dental visit for hygiene and checkup  Vaccinations: Influenza vaccine: done 04/26/21 Pneumococcal vaccine: done 03/20/17 Tdap vaccine: done 09/19/15 Shingles vaccine: done 12/28/16 & 10/18/16   Covid-19: done 08/21/19, 09/11/19, 03/30/20, 11/24/20 & 04/26/21  Conditions/risks identified: Keep up the great work!  Next appointment: Follow up in one year for your annual wellness visit.   Preventive Care 67 Years and Older, Male Preventive care refers to lifestyle choices and visits with your health care provider that can promote health and wellness. What does preventive care include? A yearly physical exam. This is also called an annual well check. Dental exams once or twice a year. Routine eye exams. Ask your health care provider how often you should have your eyes checked. Personal lifestyle choices, including: Daily care of your teeth and gums. Regular physical activity. Eating a healthy diet. Avoiding tobacco and drug use. Limiting alcohol use. Practicing safe sex. Taking low doses of aspirin every day. Taking vitamin and mineral supplements as recommended by your health care provider. What happens during an annual well check? The services and screenings done by your health care provider during your annual well check will depend on your age, overall health, lifestyle risk factors, and family history of disease. Counseling  Your health care provider may ask you  questions about your: Alcohol use. Tobacco use. Drug use. Emotional well-being. Home and relationship well-being. Sexual activity. Eating habits. History of falls. Memory and ability to understand (cognition). Work and work Statistician. Screening  You may have the following tests or measurements: Height, weight, and BMI. Blood pressure. Lipid and cholesterol levels. These may be checked every 5 years, or more frequently if you are over 73 years old. Skin check. Lung cancer screening. You may have this screening every year starting at age 73 if you have a 30-pack-year history of smoking and currently smoke or have quit within the past 15 years. Fecal occult blood test (FOBT) of the stool. You may have this test every year starting at age 73. Flexible sigmoidoscopy or colonoscopy. You may have a sigmoidoscopy every 5 years or a colonoscopy every 10 years starting at age 73. Prostate cancer screening. Recommendations will vary depending on your family history and other risks. Hepatitis C blood test. Hepatitis B blood test. Sexually transmitted disease (STD) testing. Diabetes screening. This is done by checking your blood sugar (glucose) after you have not eaten for a while (fasting). You may have this done every 1-3 years. Abdominal aortic aneurysm (AAA) screening. You may need this if you are a current or former smoker. Osteoporosis. You may be screened starting at age 66 if you are at high risk. Talk with your health care provider about your test results, treatment options, and if necessary, the need for more tests. Vaccines  Your health care provider may recommend certain vaccines, such as: Influenza vaccine. This is recommended every year. Tetanus, diphtheria, and acellular pertussis (Tdap, Td) vaccine. You may need a Td booster every 10 years. Zoster vaccine. You may need this after age 65. Pneumococcal 13-valent conjugate (PCV13)  vaccine. One dose is recommended after age  73. Pneumococcal polysaccharide (PPSV23) vaccine. One dose is recommended after age 73. Talk to your health care provider about which screenings and vaccines you need and how often you need them. This information is not intended to replace advice given to you by your health care provider. Make sure you discuss any questions you have with your health care provider. Document Released: 08/12/2015 Document Revised: 04/04/2016 Document Reviewed: 05/17/2015 Elsevier Interactive Patient Education  2017 [place] Prevention in the Home Falls can cause injuries. They can happen to people of all ages. There are many things you can do to make your home safe and to help prevent falls. What can I do on the outside of my home? Regularly fix the edges of walkways and driveways and fix any cracks. Remove anything that might make you trip as you walk through a door, such as a raised step or threshold. Trim any bushes or trees on the path to your home. Use bright outdoor lighting. Clear any walking paths of anything that might make someone trip, such as rocks or tools. Regularly check to see if handrails are loose or broken. Make sure that both sides of any steps have handrails. Any raised decks and porches should have guardrails on the edges. Have any leaves, snow, or ice cleared regularly. Use sand or salt on walking paths during winter. Clean up any spills in your garage right away. This includes oil or grease spills. What can I do in the bathroom? Use night lights. Install grab bars by the toilet and in the tub and shower. Do not use towel bars as grab bars. Use non-skid mats or decals in the tub or shower. If you need to sit down in the shower, use a plastic, non-slip stool. Keep the floor dry. Clean up any water that spills on the floor as soon as it happens. Remove soap buildup in the tub or shower regularly. Attach bath mats securely with double-sided non-slip rug tape. Do not have throw  rugs and other things on the floor that can make you trip. What can I do in the bedroom? Use night lights. Make sure that you have a light by your bed that is easy to reach. Do not use any sheets or blankets that are too big for your bed. They should not hang down onto the floor. Have a firm chair that has side arms. You can use this for support while you get dressed. Do not have throw rugs and other things on the floor that can make you trip. What can I do in the kitchen? Clean up any spills right away. Avoid walking on wet floors. Keep items that you use a lot in easy-to-reach places. If you need to reach something above you, use a strong step stool that has a grab bar. Keep electrical cords out of the way. Do not use floor polish or wax that makes floors slippery. If you must use wax, use non-skid floor wax. Do not have throw rugs and other things on the floor that can make you trip. What can I do with my stairs? Do not leave any items on the stairs. Make sure that there are handrails on both sides of the stairs and use them. Fix handrails that are broken or loose. Make sure that handrails are as long as the stairways. Check any carpeting to make sure that it is firmly attached to the stairs. Fix any carpet that is loose  or worn. Avoid having throw rugs at the top or bottom of the stairs. If you do have throw rugs, attach them to the floor with carpet tape. Make sure that you have a light switch at the top of the stairs and the bottom of the stairs. If you do not have them, ask someone to add them for you. What else can I do to help prevent falls? Wear shoes that: Do not have high heels. Have rubber bottoms. Are comfortable and fit you well. Are closed at the toe. Do not wear sandals. If you use a stepladder: Make sure that it is fully opened. Do not climb a closed stepladder. Make sure that both sides of the stepladder are locked into place. Ask someone to hold it for you, if  possible. Clearly mark and make sure that you can see: Any grab bars or handrails. First and last steps. Where the edge of each step is. Use tools that help you move around (mobility aids) if they are needed. These include: Canes. Walkers. Scooters. Crutches. Turn on the lights when you go into a dark area. Replace any light bulbs as soon as they burn out. Set up your furniture so you have a clear path. Avoid moving your furniture around. If any of your floors are uneven, fix them. If there are any pets around you, be aware of where they are. Review your medicines with your doctor. Some medicines can make you feel dizzy. This can increase your chance of falling. Ask your doctor what other things that you can do to help prevent falls. This information is not intended to replace advice given to you by your health care provider. Make sure you discuss any questions you have with your health care provider. Document Released: 05/12/2009 Document Revised: 12/22/2015 Document Reviewed: 08/20/2014 Elsevier Interactive Patient Education  2017 [employer].

## 2021-09-27 ENCOUNTER — Ambulatory Visit: Payer: Self-pay

## 2021-09-27 NOTE — Telephone Encounter (Signed)
Pt called, he had concerns about getting injection for eye disorder next Tuesday evening and then coming in Wednesday morning for blood work if that would affect his results. I advised him that it shouldn't but to double check and ask provider who he sees for eye disorder.  ? ?Summary: Clinical Advice - Eye procedure  ? Pt is seeking clinical advice about an eye procedure he has having soon  ? ?Best contact: [phone]  ?  ? ?Reason for Disposition ? Health Information question, no triage required and triager able to answer question ? ?Answer Assessment - Initial Assessment Questions ?1. REASON FOR CALL or QUESTION: "What is your reason for calling today?" or "How can I best help you?" or "What question do you have that I can help answer?" ?    Pt is getting injection for eye disorder on Tuesday evening and was concerned if that would affect blood work for Wednesday morning. ? ?Protocols used: Information Only Call - No Triage-A-AH ? ?

## 2021-10-03 DIAGNOSIS — H353211 Exudative age-related macular degeneration, right eye, with active choroidal neovascularization: Secondary | ICD-10-CM | POA: Diagnosis not present

## 2021-10-04 ENCOUNTER — Other Ambulatory Visit: Payer: Self-pay

## 2021-10-04 DIAGNOSIS — R7989 Other specified abnormal findings of blood chemistry: Secondary | ICD-10-CM | POA: Diagnosis not present

## 2021-10-04 NOTE — Progress Notes (Signed)
Printed labs

## 2021-10-05 ENCOUNTER — Telehealth: Payer: Self-pay | Admitting: Family Medicine

## 2021-10-05 ENCOUNTER — Other Ambulatory Visit: Payer: Self-pay

## 2021-10-05 DIAGNOSIS — R7989 Other specified abnormal findings of blood chemistry: Secondary | ICD-10-CM

## 2021-10-05 LAB — HEPATIC FUNCTION PANEL
ALT: 21 IU/L (ref 0–44)
AST: 26 IU/L (ref 0–40)
Albumin: 4.4 g/dL (ref 3.7–4.7)
Alkaline Phosphatase: 90 IU/L (ref 44–121)
Bilirubin Total: 1.6 mg/dL — ABNORMAL HIGH (ref 0.0–1.2)
Bilirubin, Direct: 0.36 mg/dL (ref 0.00–0.40)
Total Protein: 7.2 g/dL (ref 6.0–8.5)

## 2021-10-05 NOTE — Progress Notes (Signed)
Placed order for [place] ?

## 2021-10-05 NOTE — Telephone Encounter (Signed)
Pt stated that a voicemail was left regarding his appointment at [facility] on October 13, 2021, at 8:00 AM. Pt unsure if he should take any of his BP medication or drink anything before the appointment. ? ?Pt requesting a call back.  ?

## 2021-10-10 ENCOUNTER — Other Ambulatory Visit: Payer: Self-pay | Admitting: Family Medicine

## 2021-10-10 DIAGNOSIS — I10 Essential (primary) hypertension: Secondary | ICD-10-CM

## 2021-10-10 NOTE — Telephone Encounter (Signed)
Requested Prescriptions  ?Pending Prescriptions Disp Refills  ?? losartan (COZAAR) 50 MG tablet [Pharmacy Med Name: LOSARTAN '50MG'$  TABLETS] 90 tablet 0  ?  Sig: TAKE 1 TABLET BY MOUTH DAILY  ?  ? Cardiovascular:  Angiotensin Receptor Blockers Passed - 10/10/2021  6:18 AM  ?  ?  Passed - Cr in normal range and within 180 days  ?  Creatinine  ?Date Value Ref Range Status  ?08/26/2014 1.50 (H) 0.60 - 1.30 mg/dL Final  ? ?Creatinine, Ser  ?Date Value Ref Range Status  ?07/20/2021 1.17 0.76 - 1.27 mg/dL Final  ?   ?  ?  Passed - K in normal range and within 180 days  ?  Potassium  ?Date Value Ref Range Status  ?07/20/2021 4.5 3.5 - 5.2 mmol/L Final  ?08/26/2014 3.7 3.5 - 5.1 mmol/L Final  ?   ?  ?  Passed - Patient is not pregnant  ?  ?  Passed - Last BP in normal range  ?  BP Readings from Last 1 Encounters:  ?07/20/21 110/86  ?   ?  ?  Passed - Valid encounter within last 6 months  ?  Recent Outpatient Visits   ?      ? 2 months ago Essential hypertension  ? [facility] Juline Patch, MD  ? 8 months ago Essential hypertension  ? [facility] Juline Patch, MD  ? 1 year ago Essential hypertension  ? [facility] Juline Patch, MD  ? 1 year ago Essential hypertension  ? [facility] Juline Patch, MD  ? 2 years ago Essential hypertension  ? [facility] Juline Patch, MD  ?  ?  ?Future Appointments   ?        ? In 3 months Juline Patch, MD [facility] [place]  ?  ? ?  ?  ?  ? ? ?

## 2021-10-12 DIAGNOSIS — H6123 Impacted cerumen, bilateral: Secondary | ICD-10-CM | POA: Diagnosis not present

## 2021-10-12 DIAGNOSIS — H903 Sensorineural hearing loss, bilateral: Secondary | ICD-10-CM | POA: Diagnosis not present

## 2021-10-13 ENCOUNTER — Other Ambulatory Visit: Payer: Self-pay

## 2021-10-13 ENCOUNTER — Ambulatory Visit
Admission: RE | Admit: 2021-10-13 | Discharge: 2021-10-13 | Disposition: A | Discharge status: Home / Self Care | Payer: Medicare Other | Source: Ambulatory Visit | Attending: Family Medicine | Admitting: Family Medicine

## 2021-10-13 DIAGNOSIS — R7989 Other specified abnormal findings of blood chemistry: Secondary | ICD-10-CM

## 2021-10-13 DIAGNOSIS — K760 Fatty (change of) liver, not elsewhere classified: Secondary | ICD-10-CM | POA: Diagnosis not present

## 2021-10-13 DIAGNOSIS — R945 Abnormal results of liver function studies: Secondary | ICD-10-CM | POA: Diagnosis not present

## 2021-10-17 ENCOUNTER — Telehealth: Payer: Self-pay

## 2021-10-17 NOTE — Telephone Encounter (Signed)
Copied from [place] [id number]. Topic: General - Other ?>> Oct 17, 2021  8:09 AM Valere Dross wrote: ?Reason for CRM: Pt called in wanting to speak with [facility] nurse about his recent imaging he had done and requested a call back, please advise. ?

## 2021-11-24 DIAGNOSIS — Z20822 Contact with and (suspected) exposure to covid-19: Secondary | ICD-10-CM | POA: Diagnosis not present

## 2021-11-30 ENCOUNTER — Encounter: Payer: Self-pay | Admitting: Gastroenterology

## 2021-12-07 ENCOUNTER — Telehealth: Payer: Self-pay

## 2021-12-07 ENCOUNTER — Telehealth: Payer: Self-pay | Admitting: Gastroenterology

## 2021-12-07 NOTE — Telephone Encounter (Signed)
Patient states he has a colonoscopy on 12/14/2021. Patient has a few questions about his colonoscopy. Requesting a call back.  ?

## 2021-12-07 NOTE — Telephone Encounter (Signed)
Phone call returned to patient. His questions for colonoscopy prep were addressed.  Asked him to call office back if he had any other questions. ? ?Thanks, ?Sharyn Lull, CMA ?

## 2021-12-14 ENCOUNTER — Encounter: Payer: Self-pay | Admitting: Gastroenterology

## 2021-12-14 ENCOUNTER — Encounter
Admission: RE | Disposition: A | Discharge status: Home / Self Care | Payer: Self-pay | Source: Home / Self Care | Attending: Gastroenterology

## 2021-12-14 ENCOUNTER — Ambulatory Visit: Payer: Medicare Other | Admitting: Anesthesiology

## 2021-12-14 ENCOUNTER — Ambulatory Visit
Admission: RE | Admit: 2021-12-14 | Discharge: 2021-12-14 | Disposition: A | Discharge status: Home / Self Care | Payer: Medicare Other | Attending: Gastroenterology | Admitting: Gastroenterology

## 2021-12-14 ENCOUNTER — Other Ambulatory Visit: Payer: Self-pay

## 2021-12-14 DIAGNOSIS — Z860100 Personal history of colon polyps, unspecified: Secondary | ICD-10-CM

## 2021-12-14 DIAGNOSIS — K5730 Diverticulosis of large intestine without perforation or abscess without bleeding: Secondary | ICD-10-CM | POA: Insufficient documentation

## 2021-12-14 DIAGNOSIS — I10 Essential (primary) hypertension: Secondary | ICD-10-CM | POA: Insufficient documentation

## 2021-12-14 DIAGNOSIS — Z87891 Personal history of nicotine dependence: Secondary | ICD-10-CM | POA: Insufficient documentation

## 2021-12-14 DIAGNOSIS — K635 Polyp of colon: Secondary | ICD-10-CM | POA: Diagnosis not present

## 2021-12-14 DIAGNOSIS — K219 Gastro-esophageal reflux disease without esophagitis: Secondary | ICD-10-CM | POA: Diagnosis not present

## 2021-12-14 DIAGNOSIS — Z1211 Encounter for screening for malignant neoplasm of colon: Secondary | ICD-10-CM | POA: Diagnosis not present

## 2021-12-14 HISTORY — PX: POLYPECTOMY: SHX5525

## 2021-12-14 HISTORY — DX: Unspecified osteoarthritis, unspecified site: M19.90

## 2021-12-14 HISTORY — PX: COLONOSCOPY WITH PROPOFOL: SHX5780

## 2021-12-14 HISTORY — DX: Presence of external hearing-aid: Z97.4

## 2021-12-14 SURGERY — COLONOSCOPY WITH PROPOFOL
Anesthesia: General | Site: Rectum

## 2021-12-14 MED ORDER — ACETAMINOPHEN 500 MG PO TABS: 1000.0000 mg | ORAL_TABLET | Freq: Once | ORAL | Status: DC | PRN

## 2021-12-14 MED ORDER — SODIUM CHLORIDE 0.9 % IV SOLN: INTRAVENOUS | Status: DC

## 2021-12-14 MED ORDER — ACETAMINOPHEN 160 MG/5ML PO SOLN: 975.0000 mg | Freq: Once | ORAL | Status: DC | PRN

## 2021-12-14 MED ORDER — PROPOFOL 10 MG/ML IV BOLUS
INTRAVENOUS | Status: DC | PRN
  Administered 2021-12-14: 50 mg via INTRAVENOUS
  Administered 2021-12-14: 30 mg via INTRAVENOUS
  Administered 2021-12-14: 40 mg via INTRAVENOUS
  Administered 2021-12-14: 50 mg via INTRAVENOUS

## 2021-12-14 MED ORDER — LACTATED RINGERS IV SOLN: INTRAVENOUS | Status: DC

## 2021-12-14 MED ORDER — ONDANSETRON HCL 4 MG/2ML IJ SOLN: 4.0000 mg | Freq: Once | INTRAMUSCULAR | Status: DC | PRN

## 2021-12-14 MED ORDER — LIDOCAINE HCL (CARDIAC) PF 100 MG/5ML IV SOSY
PREFILLED_SYRINGE | INTRAVENOUS | Status: DC | PRN
  Administered 2021-12-14: 60 mg via INTRAVENOUS

## 2021-12-14 SURGICAL SUPPLY — 26 items
CLIP HMST 235XBRD CATH ROT (MISCELLANEOUS) IMPLANT
CLIP RESOLUTION 360 11X235 (MISCELLANEOUS)
ELECT REM PT RETURN 9FT ADLT (ELECTROSURGICAL)
ELECTRODE REM PT RTRN 9FT ADLT (ELECTROSURGICAL) IMPLANT
FCP ESCP3.2XJMB 240X2.8X (MISCELLANEOUS)
FORCEPS BIOP RAD 4 LRG CAP 4 (CUTTING FORCEPS) ×1 IMPLANT
FORCEPS BIOP RJ4 240 W/NDL (MISCELLANEOUS)
FORCEPS ESCP3.2XJMB 240X2.8X (MISCELLANEOUS) IMPLANT
GOWN CVR UNV OPN BCK APRN NK (MISCELLANEOUS) ×4 IMPLANT
GOWN ISOL THUMB LOOP REG UNIV (MISCELLANEOUS) ×6
INJECTOR VARIJECT VIN23 (MISCELLANEOUS) IMPLANT
KIT DEFENDO VALVE AND CONN (KITS) IMPLANT
KIT PRC NS LF DISP ENDO (KITS) ×2 IMPLANT
KIT PROCEDURE OLYMPUS (KITS) ×3
MANIFOLD NEPTUNE II (INSTRUMENTS) ×3 IMPLANT
MARKER SPOT ENDO TATTOO 5ML (MISCELLANEOUS) IMPLANT
PROBE APC STR FIRE (PROBE) IMPLANT
RETRIEVER NET ROTH 2.5X230 LF (MISCELLANEOUS) IMPLANT
SNARE COLD EXACTO (MISCELLANEOUS) IMPLANT
SNARE SHORT THROW 13M SML OVAL (MISCELLANEOUS) IMPLANT
SNARE SHORT THROW 30M LRG OVAL (MISCELLANEOUS) IMPLANT
SNARE SNG USE RND 15MM (INSTRUMENTS) IMPLANT
SPOT EX ENDOSCOPIC TATTOO (MISCELLANEOUS)
TRAP ETRAP POLY (MISCELLANEOUS) IMPLANT
VARIJECT INJECTOR VIN23 (MISCELLANEOUS)
WATER STERILE IRR 250ML POUR (IV SOLUTION) ×3 IMPLANT

## 2021-12-14 NOTE — H&P (Signed)
Juan Darby, MD [address]  [place], [place] [address]  Main: [phone]  Fax: [phone] Pager: [phone]  Primary Care Physician:  Juline Patch, MD Primary Gastroenterologist:  Dr. Cephas Moreno  Pre-Procedure History & Physical: HPI:  Juan Moreno is a 71 y.o. male is here for an colonoscopy.   Past Medical History:  Diagnosis Date   Arthritis    GERD (gastroesophageal reflux disease)    Hyperlipidemia    Hypertension    Retinopathy of right eye    Wears hearing aid     Past Surgical History:  Procedure Laterality Date   COLONOSCOPY  2013   cleared for 4 yrs- Dr Vira Agar- [place]   COLONOSCOPY WITH PROPOFOL N/A 12/26/2016   Procedure: COLONOSCOPY WITH PROPOFOL;  Surgeon: Manya Silvas, MD;  Location: [facility] ENDOSCOPY;  Service: Endoscopy;  Laterality: N/A;   STAPEDES SURGERY      Prior to Admission medications   Medication Sig Start Date End Date Taking? Authorizing Provider  amLODipine (NORVASC) 5 MG tablet Take 1 tablet (5 mg total) by mouth daily. 07/20/21  Yes Juline Patch, MD  atorvastatin (LIPITOR) 10 MG tablet Take 1 tablet daily 07/20/21  Yes Juline Patch, MD  bisacodyl (DULCOLAX) 5 MG EC tablet Take 5 mg by mouth daily as needed for moderate constipation.   Yes [provider]  loratadine (CLARITIN) 10 MG tablet Take 10 mg by mouth daily. otc   Yes [provider]  losartan (COZAAR) 50 MG tablet TAKE 1 TABLET BY MOUTH DAILY 10/10/21  Yes Juline Patch, MD  metoprolol succinate (TOPROL-XL) 25 MG 24 hr tablet TAKE 1 TABLET(25 MG) BY MOUTH DAILY 07/20/21  Yes Juline Patch, MD  pantoprazole (PROTONIX) 40 MG tablet TAKE 1 TABLET(40 MG) BY MOUTH DAILY 07/20/21  Yes Otilio Miu C, MD  psyllium (REGULOID) 0.52 g capsule Take 0.52 g by mouth daily.   Yes [provider]  PREVIDENT 5000 BOOSTER PLUS 1.1 % PSTE Place onto teeth. 08/26/21   [provider]    Allergies as of  09/06/2021 - Review Complete 09/06/2021  Allergen Reaction Noted   Penicillins Rash 03/21/2015    Family History  Problem Relation Age of Onset   Hypertension Mother    Hypertension Father     Social History   Socioeconomic History   Marital status: Widowed    Spouse name: Not on file   Number of children: 0   Years of education: Not on file   Highest education level: Not on file  Occupational History   Not on file  Tobacco Use   Smoking status: Never   Smokeless tobacco: Former    Types: Chew  Vaping Use   Vaping Use: Never used  Substance and Sexual Activity   Alcohol use: Yes    Alcohol/week: 0.0 standard drinks    Comment: rare   Drug use: No   Sexual activity: Never  Other Topics Concern   Not on file  Social History Narrative   Pt lives alone   Social Determinants of Health   Financial Resource Strain: Low Risk    Difficulty of Paying Living Expenses: Not hard at all  Food Insecurity: No Food Insecurity   Worried About Charity fundraiser in the Last Year: Never true   [place] in the Last Year: Never true  Transportation Needs: No Transportation Needs   Lack of Transportation (Medical): No   Lack of  Transportation (Non-Medical): No  Physical Activity: Sufficiently Active   Days of Exercise per Week: 7 days   Minutes of Exercise per Session: 40 min  Stress: No Stress Concern Present   Feeling of Stress : Not at all  Social Connections: Moderately Isolated   Frequency of Communication with Friends and Family: More than three times a week   Frequency of Social Gatherings with Friends and Family: More than three times a week   Attends Religious Services: More than 4 times per year   Active Member of [employer] or Organizations: No   Attends Archivist Meetings: Never   Marital Status: Widowed  Human resources officer Violence: Not At Risk   Fear of Current or Ex-Partner: No   Emotionally Abused: No   Physically Abused: No   Sexually Abused: No     Review of Systems: See HPI, otherwise negative ROS  Physical Exam: BP (!) 138/91   Pulse 83   Temp (!) 97.3 F (36.3 C) (Temporal)   Ht 6' (1.829 m)   Wt 100.5 kg   SpO2 97%   BMI 30.04 kg/m  General:   Alert,  pleasant and cooperative in NAD Head:  Normocephalic and atraumatic. Neck:  Supple; no masses or thyromegaly. Lungs:  Clear throughout to auscultation.    Heart:  Regular rate and rhythm. Abdomen:  Soft, nontender and nondistended. Normal bowel sounds, without guarding, and without rebound.   Neurologic:  Alert and  oriented x4;  grossly normal neurologically.  Impression/Plan: Juan Moreno is here for an colonoscopy to be performed for h/o colon polyps  Risks, benefits, limitations, and alternatives regarding  colonoscopy have been reviewed with the patient.  Questions have been answered.  All parties agreeable.   Sherri Sear, MD  12/14/2021, 7:46 AM

## 2021-12-14 NOTE — Anesthesia Preprocedure Evaluation (Addendum)
Anesthesia Evaluation  Patient identified by MRN, date of birth, ID band Patient awake    Reviewed: Allergy & Precautions, H&P , NPO status , Patient's Chart, lab work & pertinent test results, reviewed documented beta blocker date and time   Airway Mallampati: III  TM Distance: >3 FB Neck ROM: full    Dental no notable dental hx.    Pulmonary neg pulmonary ROS,    Pulmonary exam normal breath sounds clear to auscultation       Cardiovascular Exercise Tolerance: Good hypertension, negative cardio ROS   Rhythm:regular Rate:Normal     Neuro/Psych negative neurological ROS  negative psych ROS   GI/Hepatic negative GI ROS, Neg liver ROS, GERD  ,  Endo/Other  negative endocrine ROS  Renal/GU negative Renal ROS  negative genitourinary   Musculoskeletal   Abdominal   Peds  Hematology negative hematology ROS (+)   Anesthesia Other Findings   Reproductive/Obstetrics negative OB ROS                            Anesthesia Physical Anesthesia Plan  ASA: 2  Anesthesia Plan: General   Post-op Pain Management:    Induction:   PONV Risk Score and Plan: 2 and TIVA, Propofol infusion and Treatment may vary due to age or medical condition  Airway Management Planned:   Additional Equipment:   Intra-op Plan:   Post-operative Plan:   Informed Consent: I have reviewed the patients History and Physical, chart, labs and discussed the procedure including the risks, benefits and alternatives for the proposed anesthesia with the patient or authorized representative who has indicated his/her understanding and acceptance.     Dental Advisory Given  Plan Discussed with: CRNA  Anesthesia Plan Comments:         Anesthesia Quick Evaluation

## 2021-12-14 NOTE — Transfer of Care (Signed)
Immediate Anesthesia Transfer of Care Note  Patient: Juan Moreno  Procedure(s) Performed: COLONOSCOPY WITH PROPOFOL (Rectum) POLYPECTOMY (Rectum)  Patient Location: PACU  Anesthesia Type: General  Level of Consciousness: awake, alert  and patient cooperative  Airway and Oxygen Therapy: Patient Spontanous Breathing and Patient connected to supplemental oxygen  Post-op Assessment: Post-op Vital signs reviewed, Patient's Cardiovascular Status Stable, Respiratory Function Stable, Patent Airway and No signs of Nausea or vomiting  Post-op Vital Signs: Reviewed and stable  Complications: No notable events documented.

## 2021-12-14 NOTE — Anesthesia Postprocedure Evaluation (Signed)
Anesthesia Post Note  Patient: Juan Moreno  Procedure(s) Performed: COLONOSCOPY WITH PROPOFOL (Rectum) POLYPECTOMY (Rectum)     Patient location during evaluation: PACU Anesthesia Type: General Level of consciousness: awake and alert Pain management: pain level controlled Vital Signs Assessment: post-procedure vital signs reviewed and stable Respiratory status: spontaneous breathing, nonlabored ventilation and respiratory function stable Cardiovascular status: blood pressure returned to baseline and stable Postop Assessment: no apparent nausea or vomiting Anesthetic complications: no   No notable events documented.  April Manson

## 2021-12-14 NOTE — Op Note (Signed)
[facility] Patient Name: Juan Moreno Procedure Date: 12/14/2021 7:27 AM MRN: [id number] Account #: [id number] Date of Birth: August 29, 1950 Admit Type: Outpatient Age: 71 Room: [facility] OR ROOM 01 Gender: Male Note Status: Finalized Instrument Name: [id number] Procedure:             Colonoscopy Indications:           High risk colon cancer surveillance: Personal history                         of colonic polyps, Last colonoscopy: May 2018 Providers:             Lin Landsman MD, MD Referring MD:          Juline Patch, MD (Referring MD) Medicines:             General Anesthesia Complications:         No immediate complications. Estimated blood loss: None. Procedure:             Pre-Anesthesia Assessment:                        - Prior to the procedure, a History and Physical was                         performed, and patient medications and allergies were                         reviewed. The patient is competent. The risks and                         benefits of the procedure and the sedation options and                         risks were discussed with the patient. All questions                         were answered and informed consent was obtained.                         Patient identification and proposed procedure were                         verified by the physician, the nurse, the                         anesthesiologist, the anesthetist and the technician                         in the pre-procedure area in the procedure room in the                         endoscopy suite. Mental Status Examination: alert and                         oriented. Airway Examination: normal oropharyngeal                         airway and neck mobility. Respiratory Examination:  clear to auscultation. CV Examination: normal.                         Prophylactic Antibiotics: The patient does not require                          prophylactic antibiotics. Prior Anticoagulants: The                         patient has taken no previous anticoagulant or                         antiplatelet agents. ASA Grade Assessment: II - A                         patient with mild systemic disease. After reviewing                         the risks and benefits, the patient was deemed in                         satisfactory condition to undergo the procedure. The                         anesthesia plan was to use general anesthesia.                         Immediately prior to administration of medications,                         the patient was re-assessed for adequacy to receive                         sedatives. The heart rate, respiratory rate, oxygen                         saturations, blood pressure, adequacy of pulmonary                         ventilation, and response to care were monitored                         throughout the procedure. The physical status of the                         patient was re-assessed after the procedure.                        After obtaining informed consent, the colonoscope was                         passed under direct vision. Throughout the procedure,                         the patient's blood pressure, pulse, and oxygen                         saturations were monitored continuously. The was  introduced through the anus and advanced to the the                         terminal ileum, with identification of the appendiceal                         orifice and IC valve. The colonoscopy was performed                         without difficulty. The patient tolerated the                         procedure well. The quality of the bowel preparation                         was evaluated using the BBPS [facility] Bowel Preparation                         Scale) with scores of: Right Colon = 3, Transverse                         Colon = 3 and Left Colon = 3 (entire mucosa seen  well                         with no residual staining, small fragments of stool or                         opaque liquid). The total BBPS score equals 9. Findings:      The perianal and digital rectal examinations were normal. Pertinent       negatives include normal sphincter tone and no palpable rectal lesions.      The terminal ileum appeared normal.      A 2 mm polyp was found in the proximal ascending colon. The polyp was       sessile. The polyp was removed with a cold biopsy forceps. Resection and       retrieval were complete.      Many diverticula were found in the sigmoid colon.      The retroflexed view of the distal rectum and anal verge was normal and       showed no anal or rectal abnormalities. Impression:            - The examined portion of the ileum was normal.                        - One 2 mm polyp in the proximal ascending colon,                         removed with a cold biopsy forceps. Resected and                         retrieved.                        - Diverticulosis in the sigmoid colon.                        - The distal rectum and anal  verge are normal on                         retroflexion view. Recommendation:        - Discharge patient to home (with escort).                        - Resume previous diet today.                        - Continue present medications.                        - Await pathology results.                        - Repeat colonoscopy in 7-10 years for surveillance                         based on pathology results. Procedure Code(s):     --- Professional ---                        [phone], Colonoscopy, flexible; with biopsy, single or                         multiple Diagnosis Code(s):     --- Professional ---                        Z86.010, Personal history of colonic polyps                        K63.5, Polyp of colon                        K57.30, Diverticulosis of large intestine without                          perforation or abscess without bleeding CPT copyright 2019 American Medical Association. All rights reserved. The codes documented in this report are preliminary and upon coder review may  be revised to meet current compliance requirements. Dr. Ulyess Mort Lin Landsman MD, MD 12/14/2021 8:15:42 AM This report has been signed electronically. Number of Addenda: 0 Note Initiated On: 12/14/2021 7:27 AM Scope Withdrawal Time: 0 hours 7 minutes 15 seconds  Total Procedure Duration: 0 hours 10 minutes 6 seconds  Estimated Blood Loss:  Estimated blood loss: none.      [facility]

## 2021-12-15 ENCOUNTER — Encounter: Payer: Self-pay | Admitting: Gastroenterology

## 2021-12-18 ENCOUNTER — Encounter: Payer: Self-pay | Admitting: Gastroenterology

## 2021-12-18 LAB — SURGICAL PATHOLOGY

## 2022-01-08 ENCOUNTER — Other Ambulatory Visit: Payer: Self-pay | Admitting: Family Medicine

## 2022-01-08 DIAGNOSIS — I10 Essential (primary) hypertension: Secondary | ICD-10-CM

## 2022-01-09 DIAGNOSIS — H353211 Exudative age-related macular degeneration, right eye, with active choroidal neovascularization: Secondary | ICD-10-CM | POA: Diagnosis not present

## 2022-01-17 DIAGNOSIS — Z86018 Personal history of other benign neoplasm: Secondary | ICD-10-CM | POA: Diagnosis not present

## 2022-01-17 DIAGNOSIS — Z872 Personal history of diseases of the skin and subcutaneous tissue: Secondary | ICD-10-CM | POA: Diagnosis not present

## 2022-01-17 DIAGNOSIS — Z85828 Personal history of other malignant neoplasm of skin: Secondary | ICD-10-CM | POA: Diagnosis not present

## 2022-01-17 DIAGNOSIS — L570 Actinic keratosis: Secondary | ICD-10-CM | POA: Diagnosis not present

## 2022-01-17 DIAGNOSIS — L578 Other skin changes due to chronic exposure to nonionizing radiation: Secondary | ICD-10-CM | POA: Diagnosis not present

## 2022-01-17 DIAGNOSIS — Z859 Personal history of malignant neoplasm, unspecified: Secondary | ICD-10-CM | POA: Diagnosis not present

## 2022-01-18 ENCOUNTER — Ambulatory Visit (INDEPENDENT_AMBULATORY_CARE_PROVIDER_SITE_OTHER): Payer: Medicare Other | Admitting: Family Medicine

## 2022-01-18 ENCOUNTER — Encounter: Payer: Self-pay | Admitting: Family Medicine

## 2022-01-18 VITALS — BP 130/80 | HR 64 | Ht 72.0 in | Wt 226.0 lb

## 2022-01-18 DIAGNOSIS — K2100 Gastro-esophageal reflux disease with esophagitis, without bleeding: Secondary | ICD-10-CM | POA: Diagnosis not present

## 2022-01-18 DIAGNOSIS — E782 Mixed hyperlipidemia: Secondary | ICD-10-CM

## 2022-01-18 DIAGNOSIS — R7303 Prediabetes: Secondary | ICD-10-CM | POA: Diagnosis not present

## 2022-01-18 DIAGNOSIS — I10 Essential (primary) hypertension: Secondary | ICD-10-CM | POA: Diagnosis not present

## 2022-01-18 MED ORDER — PANTOPRAZOLE SODIUM 40 MG PO TBEC: DELAYED_RELEASE_TABLET | ORAL | 1 refills | Status: DC

## 2022-01-18 MED ORDER — ATORVASTATIN CALCIUM 10 MG PO TABS: ORAL_TABLET | ORAL | 1 refills | Status: DC

## 2022-01-18 MED ORDER — AMLODIPINE BESYLATE 5 MG PO TABS: 5.0000 mg | ORAL_TABLET | Freq: Every day | ORAL | 1 refills | Status: DC

## 2022-01-18 MED ORDER — METOPROLOL SUCCINATE ER 25 MG PO TB24: ORAL_TABLET | ORAL | 1 refills | Status: DC

## 2022-01-18 MED ORDER — LOSARTAN POTASSIUM 50 MG PO TABS: 50.0000 mg | ORAL_TABLET | Freq: Every day | ORAL | 1 refills | Status: DC

## 2022-01-18 NOTE — Progress Notes (Signed)
Date:  01/18/2022   Name:  Juan Moreno   DOB:  [birth year]   MRN:  [id number]   Chief Complaint: Hypertension, Hyperlipidemia, and Gastroesophageal Reflux  Hypertension This is a chronic problem. The current episode started more than 1 year ago. The problem has been gradually improving since onset. The problem is controlled. Pertinent negatives include no chest pain, headaches, neck pain, orthopnea, palpitations, PND or shortness of breath. There is no history of chronic renal disease.  Hyperlipidemia This is a chronic problem. The current episode started more than 1 year ago. He has no history of chronic renal disease or diabetes. Pertinent negatives include no chest pain, myalgias or shortness of breath. Current antihyperlipidemic treatment includes statins. The current treatment provides moderate improvement of lipids. There are no compliance problems.   Gastroesophageal Reflux He reports no abdominal pain, no chest pain, no coughing, no dysphagia, no heartburn, no nausea, no sore throat or no wheezing. This is a chronic problem. The problem has been gradually improving. The symptoms are aggravated by certain foods.    Lab Results  Component Value Date   NA 141 07/20/2021   K 4.5 07/20/2021   CO2 23 07/20/2021   GLUCOSE 92 07/20/2021   BUN 15 07/20/2021   CREATININE 1.17 07/20/2021   CALCIUM 9.6 07/20/2021   EGFR 66 07/20/2021   GFRNONAA 54 (L) 11/01/2020   Lab Results  Component Value Date   CHOL 123 07/20/2021   HDL 43 07/20/2021   LDLCALC 59 07/20/2021   TRIG 115 07/20/2021   CHOLHDL 2.6 02/16/2019   Lab Results  Component Value Date   TSH 1.943 02/12/2018   Lab Results  Component Value Date   HGBA1C 5.9 (H) 07/20/2021   Lab Results  Component Value Date   WBC 11.0 (H) 11/01/2020   HGB 16.3 11/01/2020   HCT 47.0 11/01/2020   MCV 84.7 11/01/2020   PLT 265 11/01/2020   Lab Results  Component Value Date   ALT 21 10/04/2021   AST 26 10/04/2021    ALKPHOS 90 10/04/2021   BILITOT 1.6 (H) 10/04/2021   No results found for: "25OHVITD2", "25OHVITD3", "VD25OH"   Review of Systems  Constitutional:  Negative for chills and fever.  HENT:  Negative for drooling, ear discharge, ear pain and sore throat.   Respiratory:  Negative for cough, shortness of breath and wheezing.   Cardiovascular:  Negative for chest pain, palpitations, orthopnea, leg swelling and PND.  Gastrointestinal:  Negative for abdominal pain, blood in stool, constipation, diarrhea, dysphagia, heartburn and nausea.  Endocrine: Negative for polydipsia.  Genitourinary:  Negative for dysuria, frequency, hematuria and urgency.  Musculoskeletal:  Negative for back pain, myalgias and neck pain.  Skin:  Negative for rash.  Allergic/Immunologic: Negative for environmental allergies.  Neurological:  Negative for dizziness and headaches.  Hematological:  Does not bruise/bleed easily.  Psychiatric/Behavioral:  Negative for suicidal ideas. The patient is not nervous/anxious.     Patient Active Problem List   Diagnosis Date Noted   History of colonic polyps    Polyp of ascending colon    Vertigo 02/12/2018   Colon cancer screening 09/17/2016   Obesity 04/11/2015   Essential hypertension 03/21/2015   Hyperlipidemia 03/21/2015   Gastroesophageal reflux disease with esophagitis 03/21/2015    Allergies  Allergen Reactions   Penicillins Rash    Past Surgical History:  Procedure Laterality Date   COLONOSCOPY  2013   cleared for 4 yrs- Dr Vira Agar- [place]   COLONOSCOPY WITH  PROPOFOL N/A 12/26/2016   Procedure: COLONOSCOPY WITH PROPOFOL;  Surgeon: Manya Silvas, MD;  Location: [facility] ENDOSCOPY;  Service: Endoscopy;  Laterality: N/A;   COLONOSCOPY WITH PROPOFOL N/A 12/14/2021   Procedure: COLONOSCOPY WITH PROPOFOL;  Surgeon: Lin Landsman, MD;  Location: [facility];  Service: Endoscopy;  Laterality: N/A;   POLYPECTOMY  12/14/2021   Procedure: POLYPECTOMY;  Surgeon:  Lin Landsman, MD;  Location: [facility];  Service: Endoscopy;;   STAPEDES SURGERY      Social History   Tobacco Use   Smoking status: Never   Smokeless tobacco: Former    Types: Chew  Vaping Use   Vaping Use: Never used  Substance Use Topics   Alcohol use: Yes    Alcohol/week: 0.0 standard drinks of alcohol    Comment: rare   Drug use: No     Medication list has been reviewed and updated.  Current Meds  Medication Sig   amLODipine (NORVASC) 5 MG tablet Take 1 tablet (5 mg total) by mouth daily.   atorvastatin (LIPITOR) 10 MG tablet Take 1 tablet daily   bisacodyl (DULCOLAX) 5 MG EC tablet Take 5 mg by mouth daily as needed for moderate constipation.   loratadine (CLARITIN) 10 MG tablet Take 10 mg by mouth daily. otc   losartan (COZAAR) 50 MG tablet TAKE 1 TABLET BY MOUTH DAILY   metoprolol succinate (TOPROL-XL) 25 MG 24 hr tablet TAKE 1 TABLET(25 MG) BY MOUTH DAILY   pantoprazole (PROTONIX) 40 MG tablet TAKE 1 TABLET(40 MG) BY MOUTH DAILY   PREVIDENT 5000 BOOSTER PLUS 1.1 % PSTE Place onto teeth.   psyllium (REGULOID) 0.52 g capsule Take 0.52 g by mouth daily.       01/18/2022    8:07 AM 02/03/2020    7:58 AM 08/06/2019    8:13 AM  GAD 7 : Generalized Anxiety Score  Nervous, Anxious, on Edge 0 0 0  Control/stop worrying 0 0 0  Worry too much - different things 0 0 0  Trouble relaxing 0 0 0  Restless 0 0 0  Easily annoyed or irritable 0 0 0  Afraid - awful might happen 0 0 0  Total GAD 7 Score 0 0 0  Anxiety Difficulty Not difficult at all         01/18/2022    8:07 AM  Depression screen PHQ 2/9  Decreased Interest 0  Down, Depressed, Hopeless 0  PHQ - 2 Score 0  Altered sleeping 0  Tired, decreased energy 0  Change in appetite 0  Feeling bad or failure about yourself  0  Trouble concentrating 0  Moving slowly or fidgety/restless 0  Suicidal thoughts 0  PHQ-9 Score 0  Difficult doing work/chores Not difficult at all    BP Readings from  Last 3 Encounters:  01/18/22 130/80  12/14/21 108/80  07/20/21 110/86    Physical Exam Vitals and nursing note reviewed.  HENT:     Head: Normocephalic.     Right Ear: Tympanic membrane and external ear normal.     Left Ear: Tympanic membrane and external ear normal.     Nose: Nose normal. No congestion or rhinorrhea.  Eyes:     General: No scleral icterus.       Right eye: No discharge.        Left eye: No discharge.     Conjunctiva/sclera: Conjunctivae normal.     Pupils: Pupils are equal, round, and reactive to light.  Neck:  Thyroid: No thyromegaly.     Vascular: No JVD.     Trachea: No tracheal deviation.  Cardiovascular:     Rate and Rhythm: Normal rate and regular rhythm.     Heart sounds: Normal heart sounds. No murmur heard.    No friction rub. No gallop.  Pulmonary:     Effort: No respiratory distress.     Breath sounds: Normal breath sounds. No wheezing, rhonchi or rales.  Chest:     Chest wall: No tenderness.  Abdominal:     General: Bowel sounds are normal.     Palpations: Abdomen is soft. There is no mass.     Tenderness: There is no abdominal tenderness. There is no guarding or rebound.  Musculoskeletal:        General: No tenderness. Normal range of motion.     Cervical back: Normal range of motion and neck supple.  Lymphadenopathy:     Cervical: No cervical adenopathy.  Skin:    General: Skin is warm.     Findings: No rash.  Neurological:     Mental Status: He is alert and oriented to person, place, and time.     Cranial Nerves: No cranial nerve deficit.     Deep Tendon Reflexes: Reflexes are normal and symmetric.     Wt Readings from Last 3 Encounters:  01/18/22 226 lb (102.5 kg)  12/14/21 221 lb 8 oz (100.5 kg)  07/20/21 223 lb (101.2 kg)    BP 130/80   Pulse 64   Ht 6' (1.829 m)   Wt 226 lb (102.5 kg)   BMI 30.65 kg/m   Assessment and Plan:  1. Essential hypertension Chronic.  Controlled.  Stable.  Today's blood pressure  reading is 130/80.  Continue amlodipine 5 mg once a day, losartan 50 mg once a day, metoprolol XL 25 mg once a day, we will check renal function panel and recheck patient in 6 months - amLODipine (NORVASC) 5 MG tablet; Take 1 tablet (5 mg total) by mouth daily.  Dispense: 90 tablet; Refill: 1 - losartan (COZAAR) 50 MG tablet; Take 1 tablet (50 mg total) by mouth daily.  Dispense: 90 tablet; Refill: 1 - metoprolol succinate (TOPROL-XL) 25 MG 24 hr tablet; TAKE 1 TABLET(25 MG) BY MOUTH DAILY  Dispense: 90 tablet; Refill: 1 - Renal Function Panel  2. Mixed hyperlipidemia Chronic.  Controlled.  Stable.  Continue atorvastatin 10 mg once a day. - atorvastatin (LIPITOR) 10 MG tablet; Take 1 tablet daily  Dispense: 90 tablet; Refill: 1  3. Gastroesophageal reflux disease with esophagitis Chronic.  Controlled.  Stable.  Pantoprazole 40 mg once a day.  Chronic.  A1c in mid prediabetic range at 5.9 on last reading. - pantoprazole (PROTONIX) 40 MG tablet; TAKE 1 TABLET(40 MG) BY MOUTH DAILY  Dispense: 90 tablet; Refill: 1  4. Prediabetes A1c in mid prediabetic range at 5.9 on last reading.  We will recheck today and encourage dietary approach with monitoring sweets and carbohydrates. - HgB A1c

## 2022-01-19 LAB — RENAL FUNCTION PANEL
Albumin: 4.6 g/dL (ref 3.7–4.7)
BUN/Creatinine Ratio: 13 (ref 10–24)
BUN: 17 mg/dL (ref 8–27)
CO2: 23 mmol/L (ref 20–29)
Calcium: 9.8 mg/dL (ref 8.6–10.2)
Chloride: 98 mmol/L (ref 96–106)
Creatinine, Ser: 1.27 mg/dL (ref 0.76–1.27)
Glucose: 96 mg/dL (ref 70–99)
Phosphorus: 3.8 mg/dL (ref 2.8–4.1)
Potassium: 4.8 mmol/L (ref 3.5–5.2)
Sodium: 137 mmol/L (ref 134–144)
eGFR: 60 mL/min/{1.73_m2} (ref >59)

## 2022-01-19 LAB — HEMOGLOBIN A1C
Est. average glucose Bld gHb Est-mCnc: 128 mg/dL
Hgb A1c MFr Bld: 6.1 % — ABNORMAL HIGH (ref 4.8–5.6)

## 2022-03-23 ENCOUNTER — Encounter: Payer: Self-pay | Admitting: Family Medicine

## 2022-03-23 ENCOUNTER — Ambulatory Visit: Payer: Self-pay | Admitting: *Deleted

## 2022-03-23 ENCOUNTER — Ambulatory Visit (INDEPENDENT_AMBULATORY_CARE_PROVIDER_SITE_OTHER): Payer: Medicare Other | Admitting: Family Medicine

## 2022-03-23 ENCOUNTER — Ambulatory Visit
Admission: RE | Admit: 2022-03-23 | Discharge: 2022-03-23 | Disposition: A | Discharge status: Home / Self Care | Payer: Medicare Other | Source: Ambulatory Visit | Attending: Family Medicine | Admitting: Family Medicine

## 2022-03-23 VITALS — BP 122/80 | HR 98 | Ht 72.0 in | Wt 226.0 lb

## 2022-03-23 DIAGNOSIS — L0211 Cutaneous abscess of neck: Secondary | ICD-10-CM | POA: Diagnosis not present

## 2022-03-23 DIAGNOSIS — M79662 Pain in left lower leg: Secondary | ICD-10-CM | POA: Insufficient documentation

## 2022-03-23 MED ORDER — BACLOFEN 10 MG PO TABS: 10.0000 mg | ORAL_TABLET | Freq: Every evening | ORAL | 0 refills | Status: DC | PRN

## 2022-03-23 MED ORDER — CELECOXIB 100 MG PO CAPS: 100.0000 mg | ORAL_CAPSULE | Freq: Two times a day (BID) | ORAL | 0 refills | Status: DC | PRN

## 2022-03-23 NOTE — Assessment & Plan Note (Signed)
Patient with 1-2-day history of progressively worsening left central gastrocnemius pain, denies any trauma or overuse but is physically active 3 days out of the week with packing and loading books for church, additionally he spent roughly 1 hour on a riding mower immobile.  He denies any radiation of symptoms, central cath, no paresthesias, denies any skin changes or swelling, pain noted with ambulation, sharp in nature.  Examination does reveal unilateral calf swelling, measurements 10 cm below tibial tuberosity as follows: Right mid gastrocnemius circumference: 40.1 cm Left mid gastrocnemius circumference: 43.0 cm Posterior calf mildly ecchymotic/dusky in appearance, equivocal Homan, negative Thompson, heel raise recreates pain.  Patient's Wells criteria for DVT can be considered 1-2 (considering 0.1 cm room for error).  Given his well score, clinical history, we will obtain duplex ultrasound to rule out DVT.  If negative, primary etiology can be considered solea strain versus gastrocnemius strain, home-based rehab, as needed Celebrex, and baclofen to be prescribed in addition to supportive care.  I have advised the patient to wait on the results prior to initiating any of those treatments.

## 2022-03-23 NOTE — Patient Instructions (Addendum)
-   Proceed for ultrasound leg - Perform gentle activity as tolerated until we have results  Once we have results and they are negative, can proceed with the following: - Use muscle relaxer baclofen nightly as needed - Use over-the-counter Voltaren gel (diclofenac 1%) for Pain as needed - Can perform heat alternating with ice - For pain not responding to the above, can use Celebrex twice a day with food sparingly - Start home exercises - Contact our office for any persistent symptoms at 1 month or for any issues between now and then  - Proceed to general surgeon Wednesday of next week

## 2022-03-23 NOTE — Progress Notes (Signed)
Primary Care / Sports Medicine Office Visit  Patient Information:  Patient ID: Juan Moreno, male DOB: 09-22-1950 Age: 71 y.o. MRN: [id number]   Juan Moreno is a pleasant 71 y.o. male presenting with the following:  Chief Complaint  Patient presents with   Leg Pain    Pt states that he started having left lower leg pain yesterday, hurts while walking, no fever, redness nor swelling.    Cyst    Cyst on back of neck, has started draining thick yellow     Vitals:   03/23/22 1314  BP: 122/80  Pulse: 98   Vitals:   03/23/22 1314  Weight: 226 lb (102.5 kg)  Height: 6' (1.829 m)   Body mass index is 30.65 kg/m.  US Venous Img Lower Unilateral Left  Result Date: 03/23/2022 CLINICAL DATA:  Left calf pain EXAM: LEFT LOWER EXTREMITY VENOUS DOPPLER ULTRASOUND TECHNIQUE: Gray-scale sonography with graded compression, as well as color Doppler and duplex ultrasound were performed to evaluate the lower extremity deep venous systems from the level of the common femoral vein and including the common femoral, femoral, profunda femoral, popliteal and calf veins including the posterior tibial, peroneal and gastrocnemius veins when visible. The superficial great saphenous vein was also interrogated. Spectral Doppler was utilized to evaluate flow at rest and with distal augmentation maneuvers in the common femoral, femoral and popliteal veins. COMPARISON:  None Available. FINDINGS: Contralateral Common Femoral Vein: Respiratory phasicity is normal and symmetric with the symptomatic side. No evidence of thrombus. Normal compressibility. Common Femoral Vein: No evidence of thrombus. Normal compressibility, respiratory phasicity and response to augmentation. Saphenofemoral Junction: No evidence of thrombus. Normal compressibility and flow on color Doppler imaging. Profunda Femoral Vein: No evidence of thrombus. Normal compressibility and flow on color Doppler imaging. Femoral Vein: No  evidence of thrombus. Normal compressibility, respiratory phasicity and response to augmentation. Popliteal Vein: No evidence of thrombus. Normal compressibility, respiratory phasicity and response to augmentation. Calf Veins: No evidence of thrombus. Normal compressibility and flow on color Doppler imaging. Superficial Great Saphenous Vein: No evidence of thrombus. Normal compressibility. Venous Reflux:  None. Other Findings:  None. IMPRESSION: No evidence of left lower extremity deep venous thrombosis. Electronically Signed   By: Davina Poke D.O.   On: 03/23/2022 15:11     Independent interpretation of notes and tests performed by another provider:   None  Procedures performed:   None  Pertinent History, Exam, Impression, and Recommendations:   Problem List Items Addressed This Visit       Other   Pain of left calf - Primary    Patient with 1-2-day history of progressively worsening left central gastrocnemius pain, denies any trauma or overuse but is physically active 3 days out of the week with packing and loading books for church, additionally he spent roughly 1 hour on a riding mower immobile.  He denies any radiation of symptoms, central cath, no paresthesias, denies any skin changes or swelling, pain noted with ambulation, sharp in nature.  Examination does reveal unilateral calf swelling, measurements 10 cm below tibial tuberosity as follows: Right mid gastrocnemius circumference: 40.1 cm Left mid gastrocnemius circumference: 43.0 cm Posterior calf mildly ecchymotic/dusky in appearance, equivocal Homan, negative Thompson, heel raise recreates pain.  Patient's Wells criteria for DVT can be considered 1-2 (considering 0.1 cm room for error).  Given his well score, clinical history, we will obtain duplex ultrasound to rule out DVT.  If negative, primary etiology can be considered  solea strain versus gastrocnemius strain, home-based rehab, as needed Celebrex, and baclofen to be  prescribed in addition to supportive care.  I have advised the patient to wait on the results prior to initiating any of those treatments.      Relevant Medications   celecoxib (CELEBREX) 100 MG capsule   baclofen (LIORESAL) 10 MG tablet   Other Relevant Orders   US Venous Img Lower Unilateral Left (Completed)   Neck abscess    Chronic issue, relapsing and recurrent symptomatology, actively symptomatic.  There is a roughly 3 x 3 erythematous circular raised region of the posterior neck with central purulent dried area, no streaking, minimally tender, mildly fluctuant.  Placed referral to general surgery as patient has had similar conditions throughout his body, we will to coordinate a visit for next Wednesday.      Relevant Orders   Ambulatory referral to General Surgery     Orders & Medications Meds ordered this encounter  Medications   celecoxib (CELEBREX) 100 MG capsule    Sig: Take 1 capsule (100 mg total) by mouth 2 (two) times daily as needed for moderate pain.    Dispense:  60 capsule    Refill:  0   baclofen (LIORESAL) 10 MG tablet    Sig: Take 1 tablet (10 mg total) by mouth at bedtime as needed for muscle spasms.    Dispense:  30 each    Refill:  0   Orders Placed This Encounter  Procedures   US Venous Img Lower Unilateral Left   Ambulatory referral to General Surgery     No follow-ups on file.     Montel Culver, MD   Primary Care Sports [facility]

## 2022-03-23 NOTE — Telephone Encounter (Signed)
  Chief Complaint: left calf pain Symptoms: pain in leg- small knot present Frequency: started yesterday  Pertinent Negatives: Patient denies chest pain, back pain, breathing difficulty, swelling, rash, fever, numbness, weakness) Disposition: '[]'$ ED /'[]'$ Urgent Care (no appt availability in office) / '[x]'$ Appointment(In office/virtual)/ '[]'$  [facility]/ '[]'$ Home Care/ '[]'$ Refused Recommended Disposition /'[]'$ [facility]/ '[]'$  Follow-up with PCP Additional Notes:

## 2022-03-23 NOTE — Telephone Encounter (Signed)
Summary: calf pain   Pt states he has throbbing pain in lt calf when moving x1d   Offered pt appt for Mon 8-28, pt states he need to be seen before the weekend   Please assist further      Reason for Disposition  [1] Thigh or calf pain AND [2] only 1 side AND [3] present > 1 hour (Exception: Chronic unchanged pain.)  Answer Assessment - Initial Assessment Questions 1. ONSET: "When did the pain start?"      yesterday 2. LOCATION: "Where is the pain located?"      Upper calf- left leg 3. PAIN: "How bad is the pain?"    (Scale 1-10; or mild, moderate, severe)   -  MILD (1-3): doesn't interfere with normal activities    -  MODERATE (4-7): interferes with normal activities (e.g., work or school) or awakens from sleep, limping    -  SEVERE (8-10): excruciating pain, unable to do any normal activities, unable to walk     moderate 4. WORK OR EXERCISE: "Has there been any recent work or exercise that involved this part of the body?"      Some push mowing 5. CAUSE: "What do you think is causing the leg pain?"     Not sure 6. OTHER SYMPTOMS: "Do you have any other symptoms?" (e.g., chest pain, back pain, breathing difficulty, swelling, rash, fever, numbness, weakness)     no 7. PREGNANCY: "Is there any chance you are pregnant?" "When was your last menstrual period?"  Protocols used: Leg Pain-A-AH

## 2022-03-23 NOTE — Assessment & Plan Note (Signed)
Chronic issue, relapsing and recurrent symptomatology, actively symptomatic.  There is a roughly 3 x 3 erythematous circular raised region of the posterior neck with central purulent dried area, no streaking, minimally tender, mildly fluctuant.  Placed referral to general surgery as patient has had similar conditions throughout his body, we will to coordinate a visit for next Wednesday.

## 2022-03-28 ENCOUNTER — Other Ambulatory Visit: Payer: Self-pay | Admitting: Surgery

## 2022-03-28 ENCOUNTER — Ambulatory Visit (INDEPENDENT_AMBULATORY_CARE_PROVIDER_SITE_OTHER): Payer: Medicare Other | Admitting: Surgery

## 2022-03-28 ENCOUNTER — Encounter: Payer: Self-pay | Admitting: Surgery

## 2022-03-28 VITALS — BP 131/89 | HR 74 | Temp 98.3°F | Ht 72.0 in | Wt 225.0 lb

## 2022-03-28 DIAGNOSIS — D367 Benign neoplasm of other specified sites: Secondary | ICD-10-CM

## 2022-03-28 DIAGNOSIS — L720 Epidermal cyst: Secondary | ICD-10-CM | POA: Diagnosis not present

## 2022-03-28 MED ORDER — SULFAMETHOXAZOLE-TRIMETHOPRIM 800-160 MG PO TABS: 1.0000 | ORAL_TABLET | Freq: Two times a day (BID) | ORAL | 0 refills | Status: AC

## 2022-03-28 NOTE — Patient Instructions (Addendum)
We have removed a Cyst in our office today.  We have sent in a prescription for antibiotics for you to start taking today. Please take all of your antibiotics.   You have sutures under the skin that will dissolve and also dermabond (skin glue) on top of your skin which will come off on it's own in 10-14 days.  You may use Ibuprofen or Tylenol as needed for pain control. Use the ice pack 3-4 times a day for the next two days for any achiness.  You may shower Friday morning, do not scrub at the area.  Avoid Strenuous activities that will make you sweat during the next 48 hours to avoid the glue coming off prematurely. Avoid activities that will place pressure to this area of the body for 1-2 weeks to avoid re-injury to incision site.  Please see your follow-up appointment provided. We will see you back in office to make sure this area is healed and to review the final pathology. If you have any questions or concerns prior to this appointment, call our office and speak with a nurse.    Excision of Skin Cysts or Lesions Excision of a skin lesion refers to the removal of a section of skin by making small cuts (incisions) in the skin. This procedure may be done to remove a cancerous (malignant) or noncancerous (benign) growth on the skin. It is typically done to treat or prevent cancer or infection. It may also be done to improve cosmetic appearance. The procedure may be done to remove: Cancerous growths, such as basal cell carcinoma, squamous cell carcinoma, or melanoma. Noncancerous growths, such as a cyst or lipoma. Growths, such as moles or skin tags, which may be removed for cosmetic reasons.  Various excision or surgical techniques may be used depending on your condition, the location of the lesion, and your overall health. Tell a health care provider about: Any allergies you have. All medicines you are taking, including vitamins, herbs, eye drops, creams, and over-the-counter  medicines. Any problems you or family members have had with anesthetic medicines. Any blood disorders you have. Any surgeries you have had. Any medical conditions you have. Whether you are pregnant or may be pregnant. What are the risks? Generally, this is a safe procedure. However, problems may occur, including: Bleeding. Infection. Scarring. Recurrence of the cyst, lipoma, or cancer. Changes in skin sensation or appearance, such as discoloration or swelling. Reaction to the anesthetics. Allergic reaction to surgical materials or ointments. Damage to nerves, blood vessels, muscles, or other structures. Continued pain.  What happens before the procedure? Ask your health care provider about: Changing or stopping your regular medicines. This is especially important if you are taking diabetes medicines or blood thinners. Taking medicines such as aspirin and ibuprofen. These medicines can thin your blood. Do not take these medicines before your procedure if your health care provider instructs you not to. You may be asked to take certain medicines. You may be asked to stop smoking. You may have an exam or testing. Plan to have someone take you home after the procedure. Plan to have someone help you with activities during recovery. What happens during the procedure? To reduce your risk of infection: Your health care team will wash or sanitize their hands. Your skin will be washed with soap. You will be given a medicine to numb the area (local anesthetic). One of the following excision techniques will be performed. At the end of any of these procedures, antibiotic ointment will  be applied as needed. Each of the following techniques may vary among health care providers and hospitals. Complete Surgical Excision The area of skin that needs to be removed will be marked with a pen. Using a small scalpel or scissors, the surgeon will gently cut around and under the lesion until it is  completely removed. The lesion will be placed in a fluid and sent to the lab for examination. If necessary, bleeding will be controlled with a device that delivers heat (electrocautery). The edges of the wound may be stitched (sutured) together, and a bandage (dressing) will be applied. This procedure may be performed to treat a cancerous growth or a noncancerous cyst or lesion. Excision of a Cyst The surgeon will make an incision on the cyst. The entire cyst will be removed through the incision. The incision may be closed with sutures and surgical glue.  What happens after the procedure? Return to your normal activities as told by your health care provider.

## 2022-03-28 NOTE — Progress Notes (Signed)
Patient ID: Juan Moreno, male   DOB: [birth year], 71 y.o.   MRN: [id number]  HPI Juan Moreno is a 71 y.o. male seen in consultation at the request of Dr. Zigmund Daniel.  Reports having an cystic lesion on posterior neck for years with some intermittent drainage.  He denies any fevers any chills.  He does report some pain that is intermittent mild and dull.  Specific alleviating or aggravating factors.  He was seen by primary care doctor.  He does take Celebrex on a daily basis.  He is able to perform more than 4 METS of activity without any shortness of breath or chest pain.  He does not take any anticoagulants or antiplatelet therapy.  No prior surgical intervention.  He has had prior dental work and has done well with local anesthetics. Does have a history of hypertension arthritis. LFTs were normal except mild elevation of the bilirubin total bilirubin of 1.6.  ABC normal hemoglobin and platelets Did have an ultrasound a few months ago out of the right upper quadrant that I personally reviewed without evidence of cholelithiasis.  No evidence of cirrhosis.  HPI  Past Medical History:  Diagnosis Date   Arthritis    GERD (gastroesophageal reflux disease)    Hyperlipidemia    Hypertension    Retinopathy of right eye    Wears hearing aid     Past Surgical History:  Procedure Laterality Date   COLONOSCOPY  2013   cleared for 4 yrs- Dr Vira Agar- [facility]   COLONOSCOPY WITH PROPOFOL N/A 12/26/2016   Procedure: COLONOSCOPY WITH PROPOFOL;  Surgeon: Manya Silvas, MD;  Location: [facility] ENDOSCOPY;  Service: Endoscopy;  Laterality: N/A;   COLONOSCOPY WITH PROPOFOL N/A 12/14/2021   Procedure: COLONOSCOPY WITH PROPOFOL;  Surgeon: Lin Landsman, MD;  Location: [place];  Service: Endoscopy;  Laterality: N/A;   POLYPECTOMY  12/14/2021   Procedure: POLYPECTOMY;  Surgeon: Lin Landsman, MD;  Location: [place];  Service: Endoscopy;;   STAPEDES SURGERY       Family History  Problem Relation Age of Onset   Hypertension Mother    Hypertension Father     Social History Social History   Tobacco Use   Smoking status: Never    Passive exposure: Never   Smokeless tobacco: Former    Types: Nurse, children's Use: Never used  Substance Use Topics   Alcohol use: Yes    Alcohol/week: 0.0 standard drinks of alcohol    Comment: rare   Drug use: No    Allergies  Allergen Reactions   Penicillins Rash    Current Outpatient Medications  Medication Sig Dispense Refill   amLODipine (NORVASC) 5 MG tablet Take 1 tablet (5 mg total) by mouth daily. 90 tablet 1   atorvastatin (LIPITOR) 10 MG tablet Take 1 tablet daily 90 tablet 1   baclofen (LIORESAL) 10 MG tablet Take 1 tablet (10 mg total) by mouth at bedtime as needed for muscle spasms. 30 each 0   bisacodyl (DULCOLAX) 5 MG EC tablet Take 5 mg by mouth daily as needed for moderate constipation.     celecoxib (CELEBREX) 100 MG capsule Take 1 capsule (100 mg total) by mouth 2 (two) times daily as needed for moderate pain. 60 capsule 0   loratadine (CLARITIN) 10 MG tablet Take 10 mg by mouth daily. otc     losartan (COZAAR) 50 MG tablet Take 1 tablet (50 mg total) by mouth daily. 90 tablet  1   metoprolol succinate (TOPROL-XL) 25 MG 24 hr tablet TAKE 1 TABLET(25 MG) BY MOUTH DAILY 90 tablet 1   pantoprazole (PROTONIX) 40 MG tablet TAKE 1 TABLET(40 MG) BY MOUTH DAILY 90 tablet 1   PREVIDENT 5000 BOOSTER PLUS 1.1 % PSTE Place onto teeth.     psyllium (REGULOID) 0.52 g capsule Take 0.52 g by mouth daily.     sulfamethoxazole-trimethoprim (BACTRIM DS) 800-160 MG tablet Take 1 tablet by mouth 2 (two) times daily for 7 days. 14 tablet 0   No current facility-administered medications for this visit.     Review of Systems Full ROS  was asked and was negative except for the information on the HPI  Physical Exam Blood pressure 131/89, pulse 74, temperature 98.3 F (36.8 C), height 6'  (1.829 m), weight 225 lb (102.1 kg), SpO2 98 %. CONSTITUTIONAL: NAD. EYES: Pupils are equal, round, Sclera are non-icteric. EARS, NOSE, MOUTH AND THROAT: The oropharynx is clear.  Hearing is intact to voice. LYMPH NODES:  Lymph nodes in the neck are normal. Neck: There is evidence of a posterior neck soft tissue cystic lesion measuring 4.2 cm.  There is some inflammatory reaction. No thyroid nodules RESPIRATORY:  Lungs are clear. There is normal respiratory effort, with equal breath sounds bilaterally, and without pathologic use of accessory muscles. CARDIOVASCULAR: Heart is regular without murmurs, gallops, or rubs. GI: The abdomen is  soft, nontender, and nondistended. There are no palpable masses. There is no hepatosplenomegaly. There are normal bowel sounds in all quadrants. GU: Rectal deferred.   MUSCULOSKELETAL: Normal muscle strength and tone. No cyanosis or edema.   SKIN: Turgor is good and there are no pathologic skin lesions or ulcers. NEUROLOGIC: Motor and sensation is grossly normal. Cranial nerves are grossly intact. PSYCH:  Oriented to person, place and time. Affect is normal.  Data Reviewed I have personally reviewed the patient's imaging, laboratory findings and medical records.    Assessment/Plan 71 year old male with a symptomatic and chronically inflamed large epidermal inclusion cyst of the posterior neck.  Gust with the patient in detail about his disease process.  I do think that at some point in time we will need to remove this.  Wishes to get this taken care of today.  He was taking NSAIDs and discussed with him that there is a slight increase chance of bleeding.  He is okay with this.  Procedure discussed with the patient detail.  Risks, benefits and possible complications including but not limited to: Bleeding, infection, recurrence, chronic pain.  He understands and wishes to proceed. I do think it is reasonable to do it here in the office under local anesthetic. Note  that this service is for a significant, separately identifiable E/M service that is above and beyond the usual  preoperative and postoperative work required on the day of the  Procedure. Specifically we talked that NSAIDs should be held for a 5 days or so to decrease the risks of bleeding. His symptoms are significant and he wishes to go ahead and proceed with excision today I Spent 45 minutes in this encounter including personally reviewing imaging studies, coordinating his care, reviewing medical records, counseling the patient, placing orders and performing appropriate documentation  PROCEDURE 1.  Excision of complex 42 mm epidermal inclusion cyst posterior neck 2.  Intermediate layered closure of 6 cm wound on posterior neck  Diagnosis: Chronically inflamed epidermal inclusion cyst posterior neck  EBL: 5 cc  Anesthesia: Lidocaine 1% with epinephrine 20 cc.  Total  Findings: Chronically inflamed epidermal inclusion cyst  After informed consent was obtained the patient was placed in a prone position he was prepped and draped in the usual sterile fashion.  I used an elliptical incision incorporating the exit site of the cystic lesion.  I was able to use Metzenbaums to dissect through subcutaneous tissue and was able to remove the cyst from the subcutaneous tissue in its entirety.  Hemostasis was obtained with pressure.  Wound was irrigated and the wound was closed in a 2 layer fashion with multiple interrupted 3-0 Vicryl's and 4-0 Monocryl for the skin in a subcutaneous fashion.  Dermabond was applied.  The specimen was sent for permanent pathology.  No complications  Caroleen Hamman, MD FACS General Surgeon 03/28/2022, 12:57 PM

## 2022-04-03 ENCOUNTER — Telehealth: Payer: Self-pay

## 2022-04-03 DIAGNOSIS — H353211 Exudative age-related macular degeneration, right eye, with active choroidal neovascularization: Secondary | ICD-10-CM | POA: Diagnosis not present

## 2022-04-03 NOTE — Telephone Encounter (Signed)
Patient notified of pathology results-per Dr.Pabon infected cyst-no other concerns- patient has follow up appointment scheduled  04/05/22.

## 2022-04-05 ENCOUNTER — Ambulatory Visit (INDEPENDENT_AMBULATORY_CARE_PROVIDER_SITE_OTHER): Payer: Medicare Other | Admitting: Physician Assistant

## 2022-04-05 ENCOUNTER — Encounter: Payer: Self-pay | Admitting: Physician Assistant

## 2022-04-05 ENCOUNTER — Other Ambulatory Visit: Payer: Self-pay

## 2022-04-05 VITALS — BP 160/98 | HR 72 | Temp 98.5°F | Ht 72.0 in | Wt 226.0 lb

## 2022-04-05 DIAGNOSIS — Z09 Encounter for follow-up examination after completed treatment for conditions other than malignant neoplasm: Secondary | ICD-10-CM

## 2022-04-05 DIAGNOSIS — L720 Epidermal cyst: Secondary | ICD-10-CM

## 2022-04-05 NOTE — Progress Notes (Signed)
[facility] POST-OP OFFICE VISIT  04/05/2022  HPI: Juan Moreno is a 71 y.o. male 8 days s/p excision of infected EIC to the posterior neck with Dr Juan Moreno   He is doing very well Reports minimal discomfort the entire time;used ice packs a few time No fever, chills Has not been told there is any erythema or drainage Dermabond is starting to clump in areas No other complaints   Vital signs: BP (!) 160/98   Pulse 72   Temp 98.5 F (36.9 C) (Oral)   Ht 6' (1.829 m)   Wt 226 lb (102.5 kg)   SpO2 100%   BMI 30.65 kg/m    Physical Exam: Constitutional: Well appearing male, NAD Skin: ~6 cm incision to the posterior neck; this is well healed; removed remaining dermabond, no erythema nor drainage   Assessment/Plan: This is a 71 y.o. male 8 days s/p excision of infected EIC to the posterior neck with Dr Juan Moreno    - Pain control prn  - Reviewed wound care recommendation  - Reviewed surgical pathology; Epidermoid cyst; infected and disrupted  - He can follow up on as needed basis; He understands to call with questions/concerns  -- Edison Simon, PA-C [facility] 04/05/2022, 2:34 PM M-F: 7am - 4pm

## 2022-04-05 NOTE — Patient Instructions (Signed)
Please call the office with any questions or concerns.

## 2022-04-26 DIAGNOSIS — Z23 Encounter for immunization: Secondary | ICD-10-CM | POA: Diagnosis not present

## 2022-05-15 DIAGNOSIS — H353211 Exudative age-related macular degeneration, right eye, with active choroidal neovascularization: Secondary | ICD-10-CM | POA: Diagnosis not present
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
## 2022-07-17 DIAGNOSIS — H353211 Exudative age-related macular degeneration, right eye, with active choroidal neovascularization: Secondary | ICD-10-CM | POA: Diagnosis not present

## 2022-07-20 ENCOUNTER — Encounter: Payer: Self-pay | Admitting: Family Medicine

## 2022-07-20 ENCOUNTER — Ambulatory Visit (INDEPENDENT_AMBULATORY_CARE_PROVIDER_SITE_OTHER): Payer: Medicare Other | Admitting: Family Medicine

## 2022-07-20 VITALS — BP 120/78 | HR 80 | Ht 72.0 in | Wt 226.0 lb

## 2022-07-20 DIAGNOSIS — I10 Essential (primary) hypertension: Secondary | ICD-10-CM

## 2022-07-20 DIAGNOSIS — K2100 Gastro-esophageal reflux disease with esophagitis, without bleeding: Secondary | ICD-10-CM | POA: Diagnosis not present

## 2022-07-20 DIAGNOSIS — R7303 Prediabetes: Secondary | ICD-10-CM

## 2022-07-20 DIAGNOSIS — E782 Mixed hyperlipidemia: Secondary | ICD-10-CM

## 2022-07-20 MED ORDER — METOPROLOL SUCCINATE ER 25 MG PO TB24: ORAL_TABLET | ORAL | 1 refills | Status: DC

## 2022-07-20 MED ORDER — PANTOPRAZOLE SODIUM 40 MG PO TBEC: DELAYED_RELEASE_TABLET | ORAL | 1 refills | Status: DC

## 2022-07-20 MED ORDER — AMLODIPINE BESYLATE 5 MG PO TABS: 5.0000 mg | ORAL_TABLET | Freq: Every day | ORAL | 1 refills | Status: DC

## 2022-07-20 MED ORDER — LOSARTAN POTASSIUM 50 MG PO TABS: 50.0000 mg | ORAL_TABLET | Freq: Every day | ORAL | 1 refills | Status: DC

## 2022-07-20 MED ORDER — ATORVASTATIN CALCIUM 10 MG PO TABS: ORAL_TABLET | ORAL | 1 refills | Status: DC

## 2022-07-20 NOTE — Progress Notes (Signed)
Date:  07/20/2022   Name:  Juan Moreno   DOB:  04-18-1951   MRN:  [id number]   Chief Complaint: Hyperlipidemia, Gastroesophageal Reflux, Hypertension, and Prediabetes  Hyperlipidemia This is a chronic problem. The current episode started more than 1 year ago. The problem is controlled. Recent lipid tests were reviewed and are normal. He has no history of chronic renal disease, diabetes, hypothyroidism, liver disease, obesity or nephrotic syndrome. There are no known factors aggravating his hyperlipidemia. Pertinent negatives include no chest pain, focal weakness, myalgias or shortness of breath. Current antihyperlipidemic treatment includes statins. The current treatment provides moderate improvement of lipids. There are no compliance problems.  There are no known risk factors for coronary artery disease.  Gastroesophageal Reflux He reports no abdominal pain, no belching, no chest pain, no choking, no coughing, no dysphagia, no heartburn or no wheezing. This is a chronic problem. The current episode started more than 1 year ago. The problem has been gradually improving. The symptoms are aggravated by certain foods. Pertinent negatives include no fatigue. There are no known risk factors. He has tried a PPI for the symptoms. The treatment provided no relief.  Hypertension This is a chronic problem. The current episode started more than 1 year ago. The problem has been waxing and waning since onset. The problem is controlled. Pertinent negatives include no blurred vision, chest pain, headaches, orthopnea, palpitations, PND or shortness of breath. There are no associated agents to hypertension. Risk factors for coronary artery disease include dyslipidemia. Past treatments include calcium channel blockers, angiotensin blockers and beta blockers. The current treatment provides moderate improvement. There are no compliance problems.  There is no history of angina, kidney disease, CAD/MI, CVA,  heart failure, left ventricular hypertrophy, PVD or retinopathy. There is no history of chronic renal disease, a hypertension causing med or renovascular disease.  Diabetes He presents for his follow-up diabetic visit. Diabetes type: prediabetes. Pertinent negatives for hypoglycemia include no headaches. Pertinent negatives for diabetes include no blurred vision, no chest pain, no fatigue, no polydipsia and no polyuria. Symptoms are stable. Pertinent negatives for diabetic complications include no CVA, PVD or retinopathy. When asked about current treatments, none were reported. His weight is stable. Meal planning includes avoidance of concentrated sweets and carbohydrate counting. He participates in exercise daily.    Lab Results  Component Value Date   NA 137 01/18/2022   K 4.8 01/18/2022   CO2 23 01/18/2022   GLUCOSE 96 01/18/2022   BUN 17 01/18/2022   CREATININE 1.27 01/18/2022   CALCIUM 9.8 01/18/2022   EGFR 60 01/18/2022   GFRNONAA 54 (L) 11/01/2020   Lab Results  Component Value Date   CHOL 123 07/20/2021   HDL 43 07/20/2021   LDLCALC 59 07/20/2021   TRIG 115 07/20/2021   CHOLHDL 2.6 02/16/2019   Lab Results  Component Value Date   TSH 1.943 02/12/2018   Lab Results  Component Value Date   HGBA1C 6.1 (H) 01/18/2022   Lab Results  Component Value Date   WBC 11.0 (H) 11/01/2020   HGB 16.3 11/01/2020   HCT 47.0 11/01/2020   MCV 84.7 11/01/2020   PLT 265 11/01/2020   Lab Results  Component Value Date   ALT 21 10/04/2021   AST 26 10/04/2021   ALKPHOS 90 10/04/2021   BILITOT 1.6 (H) 10/04/2021   No results found for: "25OHVITD2", "25OHVITD3", "VD25OH"   Review of Systems  Constitutional:  Negative for fatigue.  Eyes:  Negative for blurred  vision.  Respiratory:  Negative for cough, choking, shortness of breath and wheezing.   Cardiovascular:  Negative for chest pain, palpitations, orthopnea and PND.  Gastrointestinal:  Negative for abdominal distention, abdominal  pain, dysphagia and heartburn.  Endocrine: Negative for polydipsia and polyuria.  Genitourinary:  Negative for difficulty urinating.  Musculoskeletal:  Negative for myalgias.  Neurological:  Negative for focal weakness and headaches.    Patient Active Problem List   Diagnosis Date Noted   Pain of left calf 03/23/2022   Neck abscess 03/23/2022   History of colonic polyps    Polyp of ascending colon    Vertigo 02/12/2018   Colon cancer screening 09/17/2016   Obesity 04/11/2015   Essential hypertension 03/21/2015   Hyperlipidemia 03/21/2015   Gastroesophageal reflux disease with esophagitis 03/21/2015    Allergies  Allergen Reactions   Penicillins Rash    Past Surgical History:  Procedure Laterality Date   COLONOSCOPY  2013   cleared for 4 yrs- Dr Vira Agar- [place]   COLONOSCOPY WITH PROPOFOL N/A 12/26/2016   Procedure: COLONOSCOPY WITH PROPOFOL;  Surgeon: Manya Silvas, MD;  Location: [facility] ENDOSCOPY;  Service: Endoscopy;  Laterality: N/A;   COLONOSCOPY WITH PROPOFOL N/A 12/14/2021   Procedure: COLONOSCOPY WITH PROPOFOL;  Surgeon: Lin Landsman, MD;  Location: [place];  Service: Endoscopy;  Laterality: N/A;   POLYPECTOMY  12/14/2021   Procedure: POLYPECTOMY;  Surgeon: Lin Landsman, MD;  Location: [place];  Service: Endoscopy;;   STAPEDES SURGERY      Social History   Tobacco Use   Smoking status: Never    Passive exposure: Never   Smokeless tobacco: Former    Types: Chew  Vaping Use   Vaping Use: Never used  Substance Use Topics   Alcohol use: Yes    Alcohol/week: 0.0 standard drinks of alcohol    Comment: rare   Drug use: No     Medication list has been reviewed and updated.  Current Meds  Medication Sig   amLODipine (NORVASC) 5 MG tablet Take 1 tablet (5 mg total) by mouth daily.   atorvastatin (LIPITOR) 10 MG tablet Take 1 tablet daily   bisacodyl (DULCOLAX) 5 MG EC tablet Take 5 mg by mouth daily as needed for moderate  constipation.   loratadine (CLARITIN) 10 MG tablet Take 10 mg by mouth daily. otc   losartan (COZAAR) 50 MG tablet Take 1 tablet (50 mg total) by mouth daily.   metoprolol succinate (TOPROL-XL) 25 MG 24 hr tablet TAKE 1 TABLET(25 MG) BY MOUTH DAILY   pantoprazole (PROTONIX) 40 MG tablet TAKE 1 TABLET(40 MG) BY MOUTH DAILY   PREVIDENT 5000 BOOSTER PLUS 1.1 % PSTE Place onto teeth.   [DISCONTINUED] baclofen (LIORESAL) 10 MG tablet Take 1 tablet (10 mg total) by mouth at bedtime as needed for muscle spasms.       07/20/2022    8:03 AM 01/18/2022    8:07 AM 02/03/2020    7:58 AM 08/06/2019    8:13 AM  GAD 7 : Generalized Anxiety Score  Nervous, Anxious, on Edge 0 0 0 0  Control/stop worrying 0 0 0 0  Worry too much - different things 0 0 0 0  Trouble relaxing 0 0 0 0  Restless 0 0 0 0  Easily annoyed or irritable 0 0 0 0  Afraid - awful might happen 0 0 0 0  Total GAD 7 Score 0 0 0 0  Anxiety Difficulty Not difficult at all Not difficult  at all         07/20/2022    8:02 AM 01/18/2022    8:07 AM 09/06/2021    9:23 AM  Depression screen PHQ 2/9  Decreased Interest 0 0 0  Down, Depressed, Hopeless 0 0 0  PHQ - 2 Score 0 0 0  Altered sleeping 0 0   Tired, decreased energy 0 0   Change in appetite 0 0   Feeling bad or failure about yourself  0 0   Trouble concentrating 0 0   Moving slowly or fidgety/restless 0 0   Suicidal thoughts 0 0   PHQ-9 Score 0 0   Difficult doing work/chores Not difficult at all Not difficult at all     BP Readings from Last 3 Encounters:  07/20/22 120/78  04/05/22 (!) 160/98  03/28/22 131/89    Physical Exam Vitals and nursing note reviewed.  HENT:     Head: Normocephalic.     Right Ear: Tympanic membrane and external ear normal.     Left Ear: Tympanic membrane and external ear normal.     Nose: Nose normal. No congestion or rhinorrhea.     Mouth/Throat:     Mouth: Mucous membranes are moist.     Pharynx: No oropharyngeal exudate or posterior  oropharyngeal erythema.  Eyes:     General: No scleral icterus.       Right eye: No discharge.        Left eye: No discharge.     Conjunctiva/sclera: Conjunctivae normal.     Pupils: Pupils are equal, round, and reactive to light.  Neck:     Thyroid: No thyromegaly.     Vascular: No JVD.     Trachea: No tracheal deviation.  Cardiovascular:     Rate and Rhythm: Normal rate and regular rhythm.     Heart sounds: Normal heart sounds, S1 normal and S2 normal. No murmur heard.    No systolic murmur is present.     No diastolic murmur is present.     No friction rub. No gallop. No S3 or S4 sounds.  Pulmonary:     Effort: No respiratory distress.     Breath sounds: Normal breath sounds. No wheezing, rhonchi or rales.  Abdominal:     General: Bowel sounds are normal. There is no distension.     Palpations: Abdomen is soft. There is no mass.     Tenderness: There is no abdominal tenderness. There is no guarding or rebound.     Hernia: No hernia is present.  Musculoskeletal:        General: No tenderness. Normal range of motion.     Cervical back: Neck supple.  Lymphadenopathy:     Cervical: No cervical adenopathy.  Skin:    General: Skin is warm.     Findings: No bruising, erythema or rash.  Neurological:     Mental Status: He is alert.     Wt Readings from Last 3 Encounters:  07/20/22 226 lb (102.5 kg)  04/05/22 226 lb (102.5 kg)  03/28/22 225 lb (102.1 kg)    BP 120/78   Pulse 80   Ht 6' (1.829 m)   Wt 226 lb (102.5 kg)   SpO2 97%   BMI 30.65 kg/m   Assessment and Plan:  1. Essential hypertension Chronic.  Controlled.  Stable.  Blood pressure 120/78.  Asymptomatic.  Tolerating medication well.  Review of labs normal will continue amlodipine 5 mg once a day losartan 50 mg once  a day and Toprol XL 25 mg once a day will check renal function panel for GFR and electrolytes.  Will recheck patient in 6 months. - amLODipine (NORVASC) 5 MG tablet; Take 1 tablet (5 mg total) by  mouth daily.  Dispense: 90 tablet; Refill: 1 - losartan (COZAAR) 50 MG tablet; Take 1 tablet (50 mg total) by mouth daily.  Dispense: 90 tablet; Refill: 1 - metoprolol succinate (TOPROL-XL) 25 MG 24 hr tablet; TAKE 1 TABLET(25 MG) BY MOUTH DAILY  Dispense: 90 tablet; Refill: 1 - Renal Function Panel  2. Mixed hyperlipidemia Chronic.  Controlled.  Stable.  Continue atorvastatin 10 mg once a day.  Will check lipid panel for current status of LDL.  Reemphasized dietary intake of low-cholesterol low triglycerides. - atorvastatin (LIPITOR) 10 MG tablet; Take 1 tablet daily  Dispense: 90 tablet; Refill: 1 - Lipid Panel With LDL/HDL Ratio  3. Gastroesophageal reflux disease with esophagitis Chronic.  Controlled.  Stable.  Continue pantoprazole 40 mg once a day. - pantoprazole (PROTONIX) 40 MG tablet; TAKE 1 TABLET(40 MG) BY MOUTH DAILY  Dispense: 90 tablet; Refill: 1  4. Prediabetes Dietary approach only at this time.  Will check A1c to see if remaining in prediabetic/control range. - HgB A1c    Otilio Miu, MD

## 2022-07-21 LAB — RENAL FUNCTION PANEL
Albumin: 4.4 g/dL (ref 3.8–4.8)
BUN/Creatinine Ratio: 12 (ref 10–24)
BUN: 18 mg/dL (ref 8–27)
CO2: 24 mmol/L (ref 20–29)
Calcium: 9.7 mg/dL (ref 8.6–10.2)
Chloride: 98 mmol/L (ref 96–106)
Creatinine, Ser: 1.45 mg/dL — ABNORMAL HIGH (ref 0.76–1.27)
Glucose: 99 mg/dL (ref 70–99)
Phosphorus: 3.4 mg/dL (ref 2.8–4.1)
Potassium: 4.8 mmol/L (ref 3.5–5.2)
Sodium: 136 mmol/L (ref 134–144)
eGFR: 51 mL/min/{1.73_m2} — ABNORMAL LOW (ref >59)

## 2022-07-21 LAB — HEMOGLOBIN A1C
Est. average glucose Bld gHb Est-mCnc: 128 mg/dL
Hgb A1c MFr Bld: 6.1 % — ABNORMAL HIGH (ref 4.8–5.6)

## 2022-07-21 LAB — LIPID PANEL WITH LDL/HDL RATIO
Cholesterol, Total: 112 mg/dL (ref 100–199)
HDL: 41 mg/dL (ref >39)
LDL Chol Calc (NIH): 47 mg/dL (ref 0–99)
LDL/HDL Ratio: 1.1 ratio (ref 0.0–3.6)
Triglycerides: 140 mg/dL (ref 0–149)
VLDL Cholesterol Cal: 24 mg/dL (ref 5–40)

## 2022-07-27 ENCOUNTER — Encounter: Payer: Self-pay | Admitting: Family Medicine

## 2022-07-27 ENCOUNTER — Ambulatory Visit (INDEPENDENT_AMBULATORY_CARE_PROVIDER_SITE_OTHER): Payer: Medicare Other | Admitting: Family Medicine

## 2022-07-27 VITALS — BP 128/79 | HR 78 | Temp 98.5°F | Ht 72.0 in | Wt 230.0 lb

## 2022-07-27 DIAGNOSIS — R051 Acute cough: Secondary | ICD-10-CM | POA: Diagnosis not present

## 2022-07-27 DIAGNOSIS — R6889 Other general symptoms and signs: Secondary | ICD-10-CM

## 2022-07-27 DIAGNOSIS — J101 Influenza due to other identified influenza virus with other respiratory manifestations: Secondary | ICD-10-CM

## 2022-07-27 LAB — POCT INFLUENZA A/B
Influenza A, POC: NEGATIVE
Influenza B, POC: POSITIVE — AB

## 2022-07-27 MED ORDER — OSELTAMIVIR PHOSPHATE 75 MG PO CAPS: 75.0000 mg | ORAL_CAPSULE | Freq: Two times a day (BID) | ORAL | 0 refills | Status: DC

## 2022-07-27 MED ORDER — GUAIFENESIN-CODEINE 100-10 MG/5ML PO SYRP: 5.0000 mL | ORAL_SOLUTION | Freq: Three times a day (TID) | ORAL | 0 refills | Status: DC | PRN

## 2022-07-27 NOTE — Progress Notes (Signed)
Date:  07/27/2022   Name:  Juan Moreno   DOB:  09/09/1950   MRN:  [id number]   Chief Complaint: Cough (Started with cough 3 days ago, having fever at night with chills, body aches)  Cough This is a new problem. The current episode started 1 to 4 weeks ago. The problem has been rapidly improving. The cough is Productive of sputum. Associated symptoms include chills, a fever, nasal congestion, postnasal drip, shortness of breath and sweats. Pertinent negatives include no chest pain, headaches, myalgias, rash, rhinorrhea or sore throat. Associated symptoms comments: diaphoresis. The treatment provided moderate relief. There is no history of emphysema or environmental allergies.    Lab Results  Component Value Date   NA 136 07/20/2022   K 4.8 07/20/2022   CO2 24 07/20/2022   GLUCOSE 99 07/20/2022   BUN 18 07/20/2022   CREATININE 1.45 (H) 07/20/2022   CALCIUM 9.7 07/20/2022   EGFR 51 (L) 07/20/2022   GFRNONAA 54 (L) 11/01/2020   Lab Results  Component Value Date   CHOL 112 07/20/2022   HDL 41 07/20/2022   LDLCALC 47 07/20/2022   TRIG 140 07/20/2022   CHOLHDL 2.6 02/16/2019   Lab Results  Component Value Date   TSH 1.943 02/12/2018   Lab Results  Component Value Date   HGBA1C 6.1 (H) 07/20/2022   Lab Results  Component Value Date   WBC 11.0 (H) 11/01/2020   HGB 16.3 11/01/2020   HCT 47.0 11/01/2020   MCV 84.7 11/01/2020   PLT 265 11/01/2020   Lab Results  Component Value Date   ALT 21 10/04/2021   AST 26 10/04/2021   ALKPHOS 90 10/04/2021   BILITOT 1.6 (H) 10/04/2021   No results found for: "25OHVITD2", "25OHVITD3", "VD25OH"   Review of Systems  Constitutional:  Positive for chills and fever.  HENT:  Positive for postnasal drip. Negative for rhinorrhea and sore throat.   Respiratory:  Positive for cough and shortness of breath.   Cardiovascular:  Negative for chest pain and palpitations.  Musculoskeletal:  Negative for myalgias.  Skin:  Negative  for rash.  Allergic/Immunologic: Negative for environmental allergies.  Neurological:  Negative for headaches.    Patient Active Problem List   Diagnosis Date Noted   Pain of left calf 03/23/2022   Neck abscess 03/23/2022   History of colonic polyps    Polyp of ascending colon    Vertigo 02/12/2018   Colon cancer screening 09/17/2016   Obesity 04/11/2015   Essential hypertension 03/21/2015   Hyperlipidemia 03/21/2015   Gastroesophageal reflux disease with esophagitis 03/21/2015    Allergies  Allergen Reactions   Penicillins Rash    Past Surgical History:  Procedure Laterality Date   COLONOSCOPY  2013   cleared for 4 yrs- Dr Vira Agar- [facility]   COLONOSCOPY WITH PROPOFOL N/A 12/26/2016   Procedure: COLONOSCOPY WITH PROPOFOL;  Surgeon: Manya Silvas, MD;  [facility] ENDOSCOPY;  Service: Endoscopy;  Laterality: N/A;   COLONOSCOPY WITH PROPOFOL N/A 12/14/2021   Procedure: COLONOSCOPY WITH PROPOFOL;  Surgeon: Lin Landsman, MD;  Location: [place];  Service: Endoscopy;  Laterality: N/A;   POLYPECTOMY  12/14/2021   Procedure: POLYPECTOMY;  Surgeon: Lin Landsman, MD;  Location: [place];  Service: Endoscopy;;   STAPEDES SURGERY      Social History   Tobacco Use   Smoking status: Never    Passive exposure: Never   Smokeless tobacco: Former    Types: Database administrator  Use   Vaping Use: Never used  Substance Use Topics   Alcohol use: Yes    Alcohol/week: 0.0 standard drinks of alcohol    Comment: rare   Drug use: No     Medication list has been reviewed and updated.  Current Meds  Medication Sig   amLODipine (NORVASC) 5 MG tablet Take 1 tablet (5 mg total) by mouth daily.   atorvastatin (LIPITOR) 10 MG tablet Take 1 tablet daily   bisacodyl (DULCOLAX) 5 MG EC tablet Take 5 mg by mouth daily as needed for moderate constipation.   loratadine (CLARITIN) 10 MG tablet Take 10 mg by mouth daily. otc   losartan (COZAAR) 50 MG tablet Take 1  tablet (50 mg total) by mouth daily.   metoprolol succinate (TOPROL-XL) 25 MG 24 hr tablet TAKE 1 TABLET(25 MG) BY MOUTH DAILY   oseltamivir (TAMIFLU) 75 MG capsule Take 1 capsule (75 mg total) by mouth 2 (two) times daily.   pantoprazole (PROTONIX) 40 MG tablet TAKE 1 TABLET(40 MG) BY MOUTH DAILY   PREVIDENT 5000 BOOSTER PLUS 1.1 % PSTE Place onto teeth.       07/27/2022    3:54 PM 07/20/2022    8:03 AM 01/18/2022    8:07 AM 02/03/2020    7:58 AM  GAD 7 : Generalized Anxiety Score  Nervous, Anxious, on Edge 0 0 0 0  Control/stop worrying 0 0 0 0  Worry too much - different things 0 0 0 0  Trouble relaxing 0 0 0 0  Restless 0 0 0 0  Easily annoyed or irritable 0 0 0 0  Afraid - awful might happen 0 0 0 0  Total GAD 7 Score 0 0 0 0  Anxiety Difficulty Not difficult at all Not difficult at all Not difficult at all        07/27/2022    3:54 PM 07/20/2022    8:02 AM 01/18/2022    8:07 AM  Depression screen PHQ 2/9  Decreased Interest 0 0 0  Down, Depressed, Hopeless 0 0 0  PHQ - 2 Score 0 0 0  Altered sleeping 0 0 0  Tired, decreased energy 0 0 0  Change in appetite 0 0 0  Feeling bad or failure about yourself  0 0 0  Trouble concentrating 0 0 0  Moving slowly or fidgety/restless 0 0 0  Suicidal thoughts 0 0 0  PHQ-9 Score 0 0 0  Difficult doing work/chores Not difficult at all Not difficult at all Not difficult at all    BP Readings from Last 3 Encounters:  07/27/22 128/79  07/20/22 120/78  04/05/22 (!) 160/98    Physical Exam Vitals and nursing note reviewed.  HENT:     Head: Normocephalic.     Right Ear: Tympanic membrane, ear canal and external ear normal. There is no impacted cerumen.     Left Ear: Tympanic membrane, ear canal and external ear normal. There is no impacted cerumen.     Nose: Nose normal. No congestion or rhinorrhea.     Mouth/Throat:     Mouth: Mucous membranes are moist.  Eyes:     General: No scleral icterus.       Right eye: No discharge.         Left eye: No discharge.     Conjunctiva/sclera: Conjunctivae normal.     Pupils: Pupils are equal, round, and reactive to light.  Neck:     Thyroid: No thyromegaly.  Vascular: No JVD.     Trachea: No tracheal deviation.  Cardiovascular:     Rate and Rhythm: Normal rate and regular rhythm.     Heart sounds: Normal heart sounds. No murmur heard.    No friction rub. No gallop.  Pulmonary:     Effort: No respiratory distress.     Breath sounds: Normal breath sounds. No wheezing, rhonchi or rales.  Abdominal:     General: Bowel sounds are normal.     Palpations: Abdomen is soft. There is no mass.     Tenderness: There is no abdominal tenderness. There is no guarding or rebound.  Musculoskeletal:        General: No tenderness. Normal range of motion.     Cervical back: Normal range of motion and neck supple.  Lymphadenopathy:     Cervical: No cervical adenopathy.  Skin:    General: Skin is warm.     Findings: No rash.  Neurological:     Mental Status: He is alert and oriented to person, place, and time.     Cranial Nerves: No cranial nerve deficit.     Deep Tendon Reflexes: Reflexes are normal and symmetric.     Wt Readings from Last 3 Encounters:  07/27/22 230 lb (104.3 kg)  07/20/22 226 lb (102.5 kg)  04/05/22 226 lb (102.5 kg)    BP 128/79   Pulse 78   Temp 98.5 F (36.9 C) (Oral)   Ht 6' (1.829 m)   Wt 230 lb (104.3 kg)   SpO2 98%   BMI 31.19 kg/m   Assessment and Plan:  1. Flu-like symptoms Myalgias,, fever and chills, productive cough, upper respiratory congestion.  Flulike symptoms for which we did influenza surveillance and was positive for influenza B - POCT Influenza A/B  2. Influenza B Chronic.  Controlled.  Stable.  Will initiate Tamiflu 10 mg 1 twice a day for 5 days.  3. Acute cough Acute cough which is bothersome at night we will initiate Robitussin AC a teaspoon every 6 hours as needed for cough. - guaiFENesin-codeine (ROBITUSSIN AC)  100-10 MG/5ML syrup; Take 5 mLs by mouth 3 (three) times daily as needed for cough.  Dispense: 118 mL; Refill: 0    Otilio Miu, MD

## 2022-08-06 DIAGNOSIS — Z859 Personal history of malignant neoplasm, unspecified: Secondary | ICD-10-CM | POA: Diagnosis not present

## 2022-08-06 DIAGNOSIS — Z872 Personal history of diseases of the skin and subcutaneous tissue: Secondary | ICD-10-CM | POA: Diagnosis not present

## 2022-08-06 DIAGNOSIS — L578 Other skin changes due to chronic exposure to nonionizing radiation: Secondary | ICD-10-CM | POA: Diagnosis not present

## 2022-08-06 DIAGNOSIS — Z86018 Personal history of other benign neoplasm: Secondary | ICD-10-CM | POA: Diagnosis not present

## 2022-08-06 DIAGNOSIS — L570 Actinic keratosis: Secondary | ICD-10-CM | POA: Diagnosis not present

## 2022-08-06 DIAGNOSIS — Z85828 Personal history of other malignant neoplasm of skin: Secondary | ICD-10-CM | POA: Diagnosis not present

## 2022-08-27 DIAGNOSIS — H353211 Exudative age-related macular degeneration, right eye, with active choroidal neovascularization: Secondary | ICD-10-CM | POA: Diagnosis not present

## 2022-09-10 ENCOUNTER — Ambulatory Visit: Payer: Medicare Other

## 2022-09-12 ENCOUNTER — Ambulatory Visit (INDEPENDENT_AMBULATORY_CARE_PROVIDER_SITE_OTHER): Payer: Medicare Other

## 2022-09-12 VITALS — Wt 230.0 lb

## 2022-09-12 DIAGNOSIS — Z0000 Encounter for general adult medical examination without abnormal findings: Secondary | ICD-10-CM

## 2022-09-12 NOTE — Progress Notes (Signed)
I connected with  Michel Santee on 09/12/22 by a audio enabled telemedicine application and verified that I am speaking with the correct person using two identifiers.  Patient Location: Home  Provider Location: Office/Clinic  I discussed the limitations of evaluation and management by telemedicine. The patient expressed understanding and agreed to proceed.  Subjective:   Juan Moreno is a 72 y.o. male who presents for Medicare Annual/Subsequent preventive examination.  Review of Systems     Cardiac Risk Factors include: advanced age (>35mn, >>4women);diabetes mellitus;dyslipidemia;hypertension;male gender     Objective:    There were no vitals filed for this visit. There is no height or weight on file to calculate BMI.     09/12/2022    9:32 AM 12/14/2021    7:06 AM 09/06/2021    9:24 AM 11/01/2020    4:08 PM 09/05/2020    9:34 AM 09/02/2019    9:35 AM 05/06/2018    2:59 PM  Advanced Directives  Does Patient Have a Medical Advance Directive? Yes Yes Yes No No No No  Type of [place]amed[place]of [place] will [place] will [place]Living will      Does patient want to make changes to medical advance directive? No - Patient declined No - Patient declined       Copy of [place] Chart? Yes - validated most recent copy scanned in chart (See row information) No - copy requested Yes - validated most recent copy scanned in chart (See row information)      Would patient like information on creating a medical advance directive?     Yes (MAU/Ambulatory/Procedural Areas - Information given) Yes (MAU/Ambulatory/Procedural Areas - Information given)     Current Medications (verified) Outpatient Encounter Medications as of 09/12/2022  Medication Sig   amLODipine (NORVASC) 5 MG tablet Take 1 tablet (5 mg total) by mouth daily.   atorvastatin (LIPITOR) 10 MG tablet Take 1 tablet daily    loratadine (CLARITIN) 10 MG tablet Take 10 mg by mouth daily. otc   losartan (COZAAR) 50 MG tablet Take 1 tablet (50 mg total) by mouth daily.   metoprolol succinate (TOPROL-XL) 25 MG 24 hr tablet TAKE 1 TABLET(25 MG) BY MOUTH DAILY   pantoprazole (PROTONIX) 40 MG tablet TAKE 1 TABLET(40 MG) BY MOUTH DAILY   bisacodyl (DULCOLAX) 5 MG EC tablet Take 5 mg by mouth daily as needed for moderate constipation. (Patient not taking: Reported on 09/12/2022)   guaiFENesin-codeine (ROBITUSSIN AC) 100-10 MG/5ML syrup Take 5 mLs by mouth 3 (three) times daily as needed for cough. (Patient not taking: Reported on 09/12/2022)   oseltamivir (TAMIFLU) 75 MG capsule Take 1 capsule (75 mg total) by mouth 2 (two) times daily. (Patient not taking: Reported on 09/12/2022)   PREVIDENT 5000 BOOSTER PLUS 1.1 % PSTE Place onto teeth. (Patient not taking: Reported on 09/12/2022)   No facility-administered encounter medications on file as of 09/12/2022.    Allergies (verified) Penicillins   History: Past Medical History:  Diagnosis Date   Arthritis    GERD (gastroesophageal reflux disease)    Hyperlipidemia    Hypertension    Retinopathy of right eye    Wears hearing aid    Past Surgical History:  Procedure Laterality Date   COLONOSCOPY  2013   cleared for 4 yrs- Dr EVira Agar Divert   COLONOSCOPY WITH PROPOFOL N/A 12/26/2016   Procedure: COLONOSCOPY WITH PROPOFOL;  Surgeon: EManya Silvas MD;  Location: [facility] ENDOSCOPY;  Service: Endoscopy;  Laterality: N/A;   COLONOSCOPY WITH PROPOFOL N/A 12/14/2021   Procedure: COLONOSCOPY WITH PROPOFOL;  Surgeon: Lin Landsman, MD;  Location: [place];  Service: Endoscopy;  Laterality: N/A;   POLYPECTOMY  12/14/2021   Procedure: POLYPECTOMY;  Surgeon: Lin Landsman, MD;  Location: [place];  Service: Endoscopy;;   STAPEDES SURGERY     Family History  Problem Relation Age of Onset   Hypertension Mother    Hypertension Father    Social  History   Socioeconomic History   Marital status: Widowed    Spouse name: Not on file   Number of children: 0   Years of education: Not on file   Highest education level: Not on file  Occupational History   Not on file  Tobacco Use   Smoking status: Never    Passive exposure: Never   Smokeless tobacco: Former    Types: Chew  Vaping Use   Vaping Use: Never used  Substance and Sexual Activity   Alcohol use: Yes    Alcohol/week: 0.0 standard drinks of alcohol    Comment: rare   Drug use: No   Sexual activity: Never  Other Topics Concern   Not on file  Social History Narrative   Pt lives alone   Social Determinants of Health   Financial Resource Strain: Low Risk  (09/12/2022)   Overall Financial Resource Strain (CARDIA)    Difficulty of Paying Living Expenses: Not hard at all  Food Insecurity: No Food Insecurity (09/12/2022)   Hunger Vital Sign    Worried About Running Out of Food in the Last Year: Never true    Ran Out of Food in the Last Year: Never true  Transportation Needs: No Transportation Needs (09/12/2022)   PRAPARE - Hydrologist (Medical): No    Lack of Transportation (Non-Medical): No  Physical Activity: Sufficiently Active (09/12/2022)   Exercise Vital Sign    Days of Exercise per Week: 7 days    Minutes of Exercise per Session: 60 min  Stress: No Stress Concern Present (09/12/2022)   [place]    Feeling of Stress : Not at all  Social Connections: Moderately Isolated (09/12/2022)   Social Connection and Isolation Panel [NHANES]    Frequency of Communication with Friends and Family: More than three times a week    Frequency of Social Gatherings with Friends and Family: Twice a week    Attends Religious Services: More than 4 times per year    Active Member of [employer] or Organizations: No    Attends Archivist Meetings: Never    Marital Status: Widowed     Tobacco Counseling Counseling given: Not Answered   Clinical Intake:  Pre-visit preparation completed: Yes  Pain : No/denies pain     Nutritional Risks: None Diabetes: No  How often do you need to have someone help you when you read instructions, pamphlets, or other written materials from your doctor or pharmacy?: 1 - Never  Diabetic?yes Nutrition Risk Assessment:  Has the patient had any N/V/D within the last 2 months?  No  Does the patient have any non-healing wounds?  No  Has the patient had any unintentional weight loss or weight gain?  No   Diabetes:  Is the patient diabetic?  Yes  If diabetic, was a CBG obtained today?  No  Did the patient bring in their glucometer from  home?  No  How often do you monitor your CBG's? never.   Financial Strains and Diabetes Management:  Are you having any financial strains with the device, your supplies or your medication? No .  Does the patient want to be seen by Chronic Care Management for management of their diabetes?  No  Would the patient like to be referred to a Nutritionist or for Diabetic Management?  No   Diabetic Exams:  Diabetic Eye Exam: Completed last year. Overdue for diabetic eye exam. Pt has been advised about the importance in completing this exam.  Diabetic Foot Exam: Completed no. Pt has been advised about the importance in completing this exam.   Interpreter Needed?: No  Information entered by :: Kirke Shaggy, LPN   Activities of Daily Living    09/12/2022    9:33 AM 12/14/2021    6:54 AM  In your present state of health, do you have any difficulty performing the following activities:  Hearing? 0 0  Vision? 0 0  Difficulty concentrating or making decisions? 0 0  Walking or climbing stairs? 0 0  Dressing or bathing? 0 0  Doing errands, shopping? 0   Preparing Food and eating ? N   Using the Toilet? N   In the past six months, have you accidently leaked urine? N   Do you have problems with loss  of bowel control? N   Managing your Medications? N   Managing your Finances? N   Housekeeping or managing your Housekeeping? Y   Comment has someone who cleans every few weeks     Patient Care Team: Juline Patch, MD as PCP - General (Family Medicine)  Indicate any recent Medical Services you may have received from other than [facility] providers in the past year (date may be approximate).     Assessment:   This is a routine wellness examination for Gwyndolyn Saxon.  Hearing/Vision screen Hearing Screening - Comments:: Wears aids Vision Screening - Comments:: Readers- [facility]  Dietary issues and exercise activities discussed: Current Exercise Habits: Home exercise routine, Time (Minutes): 60, Frequency (Times/Week): 7, Weekly Exercise (Minutes/Week): 420, Intensity: Mild, Exercise limited by: None identified   Goals Addressed             This Visit's Progress    DIET - EAT MORE FRUITS AND VEGETABLES         Depression Screen    09/12/2022    9:31 AM 07/27/2022    3:54 PM 07/20/2022    8:02 AM 01/18/2022    8:07 AM 09/06/2021    9:23 AM 09/05/2020    9:34 AM 02/03/2020    7:58 AM  PHQ 2/9 Scores  PHQ - 2 Score 0 0 0 0 0 0 0  PHQ- 9 Score 0 0 0 0   0    Fall Risk    09/12/2022    9:33 AM 07/27/2022    3:54 PM 07/20/2022    8:02 AM 04/05/2022    2:24 PM 01/18/2022    8:07 AM  Fall Risk   Falls in the past year? 0 0 0 0 0  Number falls in past yr: 0 0 0  0  Injury with Fall? 0 0 0  0  Risk for fall due to : No Fall Risks No Fall Risks No Fall Risks  No Fall Risks  Follow up Falls prevention discussed;Falls evaluation completed Falls evaluation completed Falls evaluation completed  Falls evaluation completed    [place]  TO THE HOME:  Any stairs in or around the home? No  If so, are there any without handrails? No  Home free of loose throw rugs in walkways, pet beds, electrical cords, etc? Yes  Adequate lighting in your home to reduce risk of falls?  Yes   ASSISTIVE DEVICES UTILIZED TO PREVENT FALLS:  Life alert? No  Use of a cane, walker or w/c? No  Grab bars in the bathroom? No  Shower chair or bench in shower? Yes  Elevated toilet seat or a handicapped toilet? No   Cognitive Function:        09/12/2022    9:38 AM 09/02/2019    9:39 AM  6CIT Screen  What Year? 0 points 0 points  What month? 0 points 0 points  What time? 0 points 0 points  Count back from 20 0 points 0 points  Months in reverse 0 points 0 points  Repeat phrase 0 points 0 points  Total Score 0 points 0 points    Immunizations Immunization History  Administered Date(s) Administered   Fluad Quad(high Dose 65+) 04/01/2019   Influenza, High Dose Seasonal PF 05/08/2018   Influenza-Unspecified 04/29/2020, 04/26/2021, 04/26/2022   Moderna Sars-Covid-2 Vaccination 04/26/2022   PFIZER(Purple Top)SARS-COV-2 Vaccination 08/21/2019, 09/11/2019, 03/30/2020, 11/24/2020, 04/26/2021   Pneumococcal Conjugate-13 09/19/2015   Pneumococcal Polysaccharide-23 03/20/2017   Tdap 09/19/2015   Zoster Recombinat (Shingrix) 10/18/2016, 01/08/2017    TDAP status: Up to date  Flu Vaccine status: Up to date  Pneumococcal vaccine status: Up to date  Covid-19 vaccine status: Completed vaccines  Qualifies for Shingles Vaccine? Yes   Zostavax completed No   Shingrix Completed?: Yes  Screening Tests Health Maintenance  Topic Date Due   COVID-19 Vaccine (7 - 2023-24 season) 06/21/2022   Medicare Annual Wellness (AWV)  09/13/2023   DTaP/Tdap/Td (2 - Td or Tdap) 09/18/2025   COLONOSCOPY (Pts 45-39yr Insurance coverage will need to be confirmed)  12/14/2028   Pneumonia Vaccine 74 Years old  Completed   INFLUENZA VACCINE  Completed   Hepatitis C Screening  Completed   Zoster Vaccines- Shingrix  Completed   HPV VACCINES  Aged Out    Health Maintenance  Health Maintenance Due  Topic Date Due   COVID-19 Vaccine (7 - 2023-24 season) 06/21/2022    Colorectal cancer  screening: Type of screening: Colonoscopy. Completed 12/14/21. Repeat every 7 years  Lung Cancer Screening: (Low Dose CT Chest recommended if Age 74-80years, 30 pack-year currently smoking OR have quit w/in 15years.) does not qualify.   Additional Screening:  Hepatitis C Screening: does qualify; Completed 09/17/16  Vision Screening: Recommended annual ophthalmology exams for early detection of glaucoma and other disorders of the eye. Is the patient up to date with their annual eye exam?  Yes  Who is the provider or what is the name of the office in which the patient attends annual eye exams? [place] pt is not established with a provider, would they like to be referred to a provider to establish care? No .   Dental Screening: Recommended annual dental exams for proper oral hygiene  Community Resource Referral / Chronic Care Management: CRR required this visit?  No   CCM required this visit?  No      Plan:     I have personally reviewed and noted the following in the patient's chart:   Medical and social history Use of alcohol, tobacco or illicit drugs  Current medications and supplements including opioid prescriptions. Patient is  not currently taking opioid prescriptions. Functional ability and status Nutritional status Physical activity Advanced directives List of other physicians Hospitalizations, surgeries, and ER visits in previous 12 months Vitals Screenings to include cognitive, depression, and falls Referrals and appointments  In addition, I have reviewed and discussed with patient certain preventive protocols, quality metrics, and best practice recommendations. A written personalized care plan for preventive services as well as general preventive health recommendations were provided to patient.     Dionisio David, LPN   [id number]   Nurse Notes: none

## 2022-09-12 NOTE — Patient Instructions (Signed)
[name] , Thank you for taking time to come for your Medicare Wellness Visit. I appreciate your ongoing commitment to your health goals. Please review the following plan we discussed and let me know if I can assist you in the future.   These are the goals we discussed:  Goals      DIET - EAT MORE FRUITS AND VEGETABLES     Patient Stated     Patient states he would like to maintain healthy weight and activity level        This is a list of the screening recommended for you and due dates:  Health Maintenance  Topic Date Due   COVID-19 Vaccine (7 - 2023-24 season) 06/21/2022   Medicare Annual Wellness Visit  09/13/2023   DTaP/Tdap/Td vaccine (2 - Td or Tdap) 09/18/2025   Colon Cancer Screening  12/14/2028   Pneumonia Vaccine  Completed   Flu Shot  Completed   Hepatitis C Screening: USPSTF Recommendation to screen - Ages 18-79 yo.  Completed   Zoster (Shingles) Vaccine  Completed   HPV Vaccine  Aged Out    Advanced directives: no  Conditions/risks identified: none  Next appointment: Follow up in one year for your annual wellness visit. 09/18/23 @ 8:45 am by phone  Preventive Care 65 Years and Older, Male  Preventive care refers to lifestyle choices and visits with your health care provider that can promote health and wellness. What does preventive care include? A yearly physical exam. This is also called an annual well check. Dental exams once or twice a year. Routine eye exams. Ask your health care provider how often you should have your eyes checked. Personal lifestyle choices, including: Daily care of your teeth and gums. Regular physical activity. Eating a healthy diet. Avoiding tobacco and drug use. Limiting alcohol use. Practicing safe sex. Taking low doses of aspirin every day. Taking vitamin and mineral supplements as recommended by your health care provider. What happens during an annual well check? The services and screenings done by your health care  provider during your annual well check will depend on your age, overall health, lifestyle risk factors, and family history of disease. Counseling  Your health care provider may ask you questions about your: Alcohol use. Tobacco use. Drug use. Emotional well-being. Home and relationship well-being. Sexual activity. Eating habits. History of falls. Memory and ability to understand (cognition). Work and work Statistician. Screening  You may have the following tests or measurements: Height, weight, and BMI. Blood pressure. Lipid and cholesterol levels. These may be checked every 5 years, or more frequently if you are over 76 years old. Skin check. Lung cancer screening. You may have this screening every year starting at age 40 if you have a 30-pack-year history of smoking and currently smoke or have quit within the past 15 years. Fecal occult blood test (FOBT) of the stool. You may have this test every year starting at age 59. Flexible sigmoidoscopy or colonoscopy. You may have a sigmoidoscopy every 5 years or a colonoscopy every 10 years starting at age 25. Prostate cancer screening. Recommendations will vary depending on your family history and other risks. Hepatitis C blood test. Hepatitis B blood test. Sexually transmitted disease (STD) testing. Diabetes screening. This is done by checking your blood sugar (glucose) after you have not eaten for a while (fasting). You may have this done every 1-3 years. Abdominal aortic aneurysm (AAA) screening. You may need this if you are a current or former smoker. Osteoporosis. You  may be screened starting at age 30 if you are at high risk. Talk with your health care provider about your test results, treatment options, and if necessary, the need for more tests. Vaccines  Your health care provider may recommend certain vaccines, such as: Influenza vaccine. This is recommended every year. Tetanus, diphtheria, and acellular pertussis (Tdap, Td)  vaccine. You may need a Td booster every 10 years. Zoster vaccine. You may need this after age 71. Pneumococcal 13-valent conjugate (PCV13) vaccine. One dose is recommended after age 3. Pneumococcal polysaccharide (PPSV23) vaccine. One dose is recommended after age 40. Talk to your health care provider about which screenings and vaccines you need and how often you need them. This information is not intended to replace advice given to you by your health care provider. Make sure you discuss any questions you have with your health care provider. Document Released: 08/12/2015 Document Revised: 04/04/2016 Document Reviewed: 05/17/2015 Elsevier Interactive Patient Education  2017 [employer] Prevention in the Home Falls can cause injuries. They can happen to people of all ages. There are many things you can do to make your home safe and to help prevent falls. What can I do on the outside of my home? Regularly fix the edges of walkways and driveways and fix any cracks. Remove anything that might make you trip as you walk through a door, such as a raised step or threshold. Trim any bushes or trees on the path to your home. Use bright outdoor lighting. Clear any walking paths of anything that might make someone trip, such as rocks or tools. Regularly check to see if handrails are loose or broken. Make sure that both sides of any steps have handrails. Any raised decks and porches should have guardrails on the edges. Have any leaves, snow, or ice cleared regularly. Use sand or salt on walking paths during winter. Clean up any spills in your garage right away. This includes oil or grease spills. What can I do in the bathroom? Use night lights. Install grab bars by the toilet and in the tub and shower. Do not use towel bars as grab bars. Use non-skid mats or decals in the tub or shower. If you need to sit down in the shower, use a plastic, non-slip stool. Keep the floor dry. Clean up any  water that spills on the floor as soon as it happens. Remove soap buildup in the tub or shower regularly. Attach bath mats securely with double-sided non-slip rug tape. Do not have throw rugs and other things on the floor that can make you trip. What can I do in the bedroom? Use night lights. Make sure that you have a light by your bed that is easy to reach. Do not use any sheets or blankets that are too big for your bed. They should not hang down onto the floor. Have a firm chair that has side arms. You can use this for support while you get dressed. Do not have throw rugs and other things on the floor that can make you trip. What can I do in the kitchen? Clean up any spills right away. Avoid walking on wet floors. Keep items that you use a lot in easy-to-reach places. If you need to reach something above you, use a strong step stool that has a grab bar. Keep electrical cords out of the way. Do not use floor polish or wax that makes floors slippery. If you must use wax, use non-skid floor wax. Do  not have throw rugs and other things on the floor that can make you trip. What can I do with my stairs? Do not leave any items on the stairs. Make sure that there are handrails on both sides of the stairs and use them. Fix handrails that are broken or loose. Make sure that handrails are as long as the stairways. Check any carpeting to make sure that it is firmly attached to the stairs. Fix any carpet that is loose or worn. Avoid having throw rugs at the top or bottom of the stairs. If you do have throw rugs, attach them to the floor with carpet tape. Make sure that you have a light switch at the top of the stairs and the bottom of the stairs. If you do not have them, ask someone to add them for you. What else can I do to help prevent falls? Wear shoes that: Do not have high heels. Have rubber bottoms. Are comfortable and fit you well. Are closed at the toe. Do not wear sandals. If you use a  stepladder: Make sure that it is fully opened. Do not climb a closed stepladder. Make sure that both sides of the stepladder are locked into place. Ask someone to hold it for you, if possible. Clearly mark and make sure that you can see: Any grab bars or handrails. First and last steps. Where the edge of each step is. Use tools that help you move around (mobility aids) if they are needed. These include: Canes. Walkers. Scooters. Crutches. Turn on the lights when you go into a dark area. Replace any light bulbs as soon as they burn out. Set up your furniture so you have a clear path. Avoid moving your furniture around. If any of your floors are uneven, fix them. If there are any pets around you, be aware of where they are. Review your medicines with your doctor. Some medicines can make you feel dizzy. This can increase your chance of falling. Ask your doctor what other things that you can do to help prevent falls. This information is not intended to replace advice given to you by your health care provider. Make sure you discuss any questions you have with your health care provider. Document Released: 05/12/2009 Document Revised: 12/22/2015 Document Reviewed: 08/20/2014 Elsevier Interactive Patient Education  2017 [employer].

## 2022-10-02 ENCOUNTER — Other Ambulatory Visit: Payer: Self-pay

## 2022-10-02 DIAGNOSIS — I10 Essential (primary) hypertension: Secondary | ICD-10-CM

## 2022-10-02 DIAGNOSIS — H35711 Central serous chorioretinopathy, right eye: Secondary | ICD-10-CM | POA: Diagnosis not present

## 2022-10-02 DIAGNOSIS — H353211 Exudative age-related macular degeneration, right eye, with active choroidal neovascularization: Secondary | ICD-10-CM | POA: Diagnosis not present

## 2022-10-02 DIAGNOSIS — H2513 Age-related nuclear cataract, bilateral: Secondary | ICD-10-CM | POA: Diagnosis not present

## 2022-10-02 MED ORDER — LOSARTAN POTASSIUM 50 MG PO TABS: 50.0000 mg | ORAL_TABLET | Freq: Every day | ORAL | 0 refills | Status: DC

## 2022-10-03 ENCOUNTER — Telehealth: Payer: Self-pay | Admitting: Family Medicine

## 2022-10-03 NOTE — Telephone Encounter (Signed)
Patient called in was told by [facility] that Losartan isn't covered under his insurance

## 2022-10-29 ENCOUNTER — Other Ambulatory Visit: Payer: Self-pay | Admitting: Family Medicine

## 2022-10-29 DIAGNOSIS — I10 Essential (primary) hypertension: Secondary | ICD-10-CM

## 2022-11-02 DIAGNOSIS — H903 Sensorineural hearing loss, bilateral: Secondary | ICD-10-CM | POA: Diagnosis not present

## 2022-11-02 DIAGNOSIS — H6123 Impacted cerumen, bilateral: Secondary | ICD-10-CM | POA: Diagnosis not present

## 2022-11-15 ENCOUNTER — Telehealth: Payer: Self-pay | Admitting: Family Medicine

## 2022-11-15 ENCOUNTER — Other Ambulatory Visit: Payer: Self-pay

## 2022-11-15 NOTE — Telephone Encounter (Signed)
Copied from CRM [id number]. Topic: General - Other >> Nov 15, 2022 11:13 AM Turkey B wrote: Reason for CRM: Pt called in asking if its on file what he filled out a living will and power of attorney. Please call back

## 2022-11-21 DIAGNOSIS — H353211 Exudative age-related macular degeneration, right eye, with active choroidal neovascularization: Secondary | ICD-10-CM | POA: Diagnosis not present

## 2023-01-01 DIAGNOSIS — H353211 Exudative age-related macular degeneration, right eye, with active choroidal neovascularization: Secondary | ICD-10-CM | POA: Diagnosis not present

## 2023-01-11 ENCOUNTER — Other Ambulatory Visit: Payer: Self-pay | Admitting: Family Medicine

## 2023-01-11 DIAGNOSIS — K2100 Gastro-esophageal reflux disease with esophagitis, without bleeding: Secondary | ICD-10-CM

## 2023-01-11 NOTE — Telephone Encounter (Signed)
Requested Prescriptions  Pending Prescriptions Disp Refills   pantoprazole (PROTONIX) 40 MG tablet [Pharmacy Med Name: PANTOPRAZOLE 40MG  TABLETS] 90 tablet 0    Sig: TAKE 1 TABLET(40 MG) BY MOUTH DAILY     Gastroenterology: Proton Pump Inhibitors Passed - 01/11/2023  6:21 AM      Passed - Valid encounter within last 12 months    Recent Outpatient Visits           5 months ago Flu-like symptoms   [facility]   5 months ago Essential hypertension   [place] [facility]   9 months ago Pain of left calf   [facility], Ocie Bob, MD   11 months ago Essential hypertension   [facility]   1 year ago Essential hypertension   [place] [facility]       Future Appointments             In 1 week Duanne Limerick, MD [facility] at [facility], [facility]

## 2023-01-21 ENCOUNTER — Ambulatory Visit: Payer: Medicare Other | Admitting: Family Medicine

## 2023-01-21 ENCOUNTER — Encounter: Payer: Self-pay | Admitting: Family Medicine

## 2023-01-21 ENCOUNTER — Ambulatory Visit (INDEPENDENT_AMBULATORY_CARE_PROVIDER_SITE_OTHER): Payer: Medicare Other | Admitting: Family Medicine

## 2023-01-21 VITALS — BP 118/70 | HR 78 | Ht 72.0 in | Wt 228.0 lb

## 2023-01-21 DIAGNOSIS — I10 Essential (primary) hypertension: Secondary | ICD-10-CM | POA: Diagnosis not present

## 2023-01-21 DIAGNOSIS — R7303 Prediabetes: Secondary | ICD-10-CM

## 2023-01-21 DIAGNOSIS — E782 Mixed hyperlipidemia: Secondary | ICD-10-CM | POA: Diagnosis not present

## 2023-01-21 DIAGNOSIS — K2100 Gastro-esophageal reflux disease with esophagitis, without bleeding: Secondary | ICD-10-CM | POA: Diagnosis not present

## 2023-01-21 MED ORDER — ATORVASTATIN CALCIUM 10 MG PO TABS: ORAL_TABLET | ORAL | 1 refills | Status: DC

## 2023-01-21 MED ORDER — AMLODIPINE BESYLATE 5 MG PO TABS: ORAL_TABLET | ORAL | 1 refills | Status: DC

## 2023-01-21 MED ORDER — LOSARTAN POTASSIUM 50 MG PO TABS: 50.0000 mg | ORAL_TABLET | Freq: Every day | ORAL | 1 refills | Status: DC

## 2023-01-21 MED ORDER — METOPROLOL SUCCINATE ER 25 MG PO TB24: ORAL_TABLET | ORAL | 1 refills | Status: DC

## 2023-01-21 MED ORDER — PANTOPRAZOLE SODIUM 40 MG PO TBEC: DELAYED_RELEASE_TABLET | ORAL | 1 refills | Status: DC

## 2023-01-21 NOTE — Progress Notes (Signed)
Date:  01/21/2023   Name:  Juan Moreno   DOB:  Aug 25, 1950   MRN:  [id number]   Chief Complaint: Hypertension, Hyperlipidemia, Gastroesophageal Reflux, and Prediabetes  Hypertension This is a chronic problem. The current episode started more than 1 year ago. The problem has been gradually improving since onset. The problem is controlled. Pertinent negatives include no anxiety, blurred vision, chest pain, headaches, neck pain, orthopnea, palpitations, peripheral edema, PND, shortness of breath or sweats. There are no associated agents to hypertension. There are no known risk factors for coronary artery disease. Past treatments include beta blockers, calcium channel blockers and angiotensin blockers. The current treatment provides moderate improvement. There are no compliance problems.  There is no history of angina, kidney disease, CAD/MI, CVA, heart failure, left ventricular hypertrophy, PVD or retinopathy. There is no history of chronic renal disease, a hypertension causing med or renovascular disease.  Hyperlipidemia This is a chronic problem. The current episode started more than 1 year ago. The problem is controlled. Recent lipid tests were reviewed and are normal. He has no history of chronic renal disease. Pertinent negatives include no chest pain or shortness of breath. Current antihyperlipidemic treatment includes statins. The current treatment provides moderate improvement of lipids. There are no compliance problems.   Gastroesophageal Reflux He reports no abdominal pain, no belching, no chest pain, no choking, no coughing, no dysphagia, no heartburn, no nausea, no sore throat or no wheezing. This is a chronic problem. The current episode started more than 1 year ago. The problem occurs occasionally. The problem has been gradually improving. The symptoms are aggravated by certain foods. Pertinent negatives include no fatigue or weight loss. He has tried a PPI for the symptoms. The  treatment provided moderate relief.  Diabetes He presents for his follow-up diabetic visit. Diabetes type: prediabetes. His disease course has been stable. Pertinent negatives for hypoglycemia include no headaches, nervousness/anxiousness or sweats. Pertinent negatives for diabetes include no blurred vision, no chest pain, no fatigue, no foot paresthesias, no foot ulcerations, no polydipsia, no polyphagia, no polyuria, no visual change, no weakness and no weight loss. There are no hypoglycemic complications. Symptoms are stable. There are no diabetic complications. Pertinent negatives for diabetic complications include no CVA, PVD or retinopathy. There are no known risk factors for coronary artery disease. When asked about current treatments, none were reported. He is following a generally healthy diet. Meal planning includes avoidance of concentrated sweets and carbohydrate counting. He participates in exercise daily. An ACE inhibitor/angiotensin II receptor blocker is being taken.    Lab Results  Component Value Date   NA 136 07/20/2022   K 4.8 07/20/2022   CO2 24 07/20/2022   GLUCOSE 99 07/20/2022   BUN 18 07/20/2022   CREATININE 1.45 (H) 07/20/2022   CALCIUM 9.7 07/20/2022   EGFR 51 (L) 07/20/2022   GFRNONAA 54 (L) 11/01/2020   Lab Results  Component Value Date   CHOL 112 07/20/2022   HDL 41 07/20/2022   LDLCALC 47 07/20/2022   TRIG 140 07/20/2022   CHOLHDL 2.6 02/16/2019   Lab Results  Component Value Date   TSH 1.943 02/12/2018   Lab Results  Component Value Date   HGBA1C 6.1 (H) 07/20/2022   Lab Results  Component Value Date   WBC 11.0 (H) 11/01/2020   HGB 16.3 11/01/2020   HCT 47.0 11/01/2020   MCV 84.7 11/01/2020   PLT 265 11/01/2020   Lab Results  Component Value Date   ALT 21  10/04/2021   AST 26 10/04/2021   ALKPHOS 90 10/04/2021   BILITOT 1.6 (H) 10/04/2021   No results found for: "25OHVITD2", "25OHVITD3", "VD25OH"   Review of Systems  Constitutional:   Negative for fatigue and weight loss.  HENT:  Negative for sore throat and trouble swallowing.   Eyes:  Negative for blurred vision and visual disturbance.  Respiratory:  Negative for cough, choking, shortness of breath and wheezing.   Cardiovascular:  Negative for chest pain, palpitations, orthopnea and PND.  Gastrointestinal:  Negative for abdominal pain, blood in stool, constipation, dysphagia, heartburn and nausea.  Endocrine: Negative for polydipsia, polyphagia and polyuria.  Genitourinary:  Negative for difficulty urinating.  Musculoskeletal:  Negative for arthralgias and neck pain.  Neurological:  Negative for facial asymmetry, weakness and headaches.  Hematological:  Negative for adenopathy. Does not bruise/bleed easily.  Psychiatric/Behavioral:  The patient is not nervous/anxious.     Patient Active Problem List   Diagnosis Date Noted   Pain of left calf 03/23/2022   Neck abscess 03/23/2022   History of colonic polyps    Polyp of ascending colon    Vertigo 02/12/2018   Colon cancer screening 09/17/2016   Obesity 04/11/2015   Essential hypertension 03/21/2015   Hyperlipidemia 03/21/2015   Gastroesophageal reflux disease with esophagitis 03/21/2015    Allergies  Allergen Reactions   Penicillins Rash    Past Surgical History:  Procedure Laterality Date   COLONOSCOPY  2013   cleared for 4 yrs- Dr Mechele Collin- [place]   COLONOSCOPY WITH PROPOFOL N/A 12/26/2016   Procedure: COLONOSCOPY WITH PROPOFOL;  Surgeon: Scot Jun, MD;  Location[facility] END[facility];  Service: Endoscopy;  Laterality: N/A;   COLONOSCOPY WITH PROPOFOL N/A 12/14/2021   Procedure: COLONOSCOPY WITH PROPOFOL;  Surgeon: Toney Reil, MD;  Location: [facility];  Service: Endoscopy;  Laterality: N/A;   POLYPECTOMY  12/14/2021   Procedure: POLYPECTOMY;  Surgeon: Toney Reil, MD;  Location: [facility];  Service: Endoscopy;;   STAPEDES SURGERY      Social History   Tobacco Use    Smoking status: Never    Passive exposure: Never   Smokeless tobacco: Former    Types: Chew  Vaping Use   Vaping Use: Never used  Substance Use Topics   Alcohol use: Yes    Alcohol/week: 0.0 standard drinks of alcohol    Comment: rare   Drug use: No     Medication list has been reviewed and updated.  Current Meds  Medication Sig   amLODipine (NORVASC) 5 MG tablet TAKE 1 TABLET(5 MG) BY MOUTH DAILY   atorvastatin (LIPITOR) 10 MG tablet Take 1 tablet daily   loratadine (CLARITIN) 10 MG tablet Take 10 mg by mouth daily. otc   losartan (COZAAR) 50 MG tablet Take 1 tablet (50 mg total) by mouth daily.   metoprolol succinate (TOPROL-XL) 25 MG 24 hr tablet TAKE 1 TABLET(25 MG) BY MOUTH DAILY   pantoprazole (PROTONIX) 40 MG tablet TAKE 1 TABLET(40 MG) BY MOUTH DAILY   [DISCONTINUED] bisacodyl (DULCOLAX) 5 MG EC tablet Take 5 mg by mouth daily as needed for moderate constipation.       01/21/2023    8:00 AM 07/27/2022    3:54 PM 07/20/2022    8:03 AM 01/18/2022    8:07 AM  GAD 7 : Generalized Anxiety Score  Nervous, Anxious, on Edge 0 0 0 0  Control/stop worrying 0 0 0 0  Worry too much - different things 0 0 0  0  Trouble relaxing 0 0 0 0  Restless 0 0 0 0  Easily annoyed or irritable 0 0 0 0  Afraid - awful might happen 0 0 0 0  Total GAD 7 Score 0 0 0 0  Anxiety Difficulty Not difficult at all Not difficult at all Not difficult at all Not difficult at all       01/21/2023    8:00 AM 09/12/2022    9:31 AM 07/27/2022    3:54 PM  Depression screen PHQ 2/9  Decreased Interest 0 0 0  Down, Depressed, Hopeless 0 0 0  PHQ - 2 Score 0 0 0  Altered sleeping 0 0 0  Tired, decreased energy 0 0 0  Change in appetite 0 0 0  Feeling bad or failure about yourself  0 0 0  Trouble concentrating 0 0 0  Moving slowly or fidgety/restless 0 0 0  Suicidal thoughts 0 0 0  PHQ-9 Score 0 0 0  Difficult doing work/chores Not difficult at all Not difficult at all Not difficult at all     BP Readings from Last 3 Encounters:  01/21/23 118/70  07/27/22 128/79  07/20/22 120/78    Physical Exam Vitals and nursing note reviewed.  HENT:     Head: Normocephalic.     Right Ear: External ear normal.     Left Ear: External ear normal.     Nose: Nose normal.     Mouth/Throat:     Mouth: Mucous membranes are moist.  Eyes:     General: No scleral icterus.       Right eye: No discharge.        Left eye: No discharge.     Conjunctiva/sclera: Conjunctivae normal.     Pupils: Pupils are equal, round, and reactive to light.  Neck:     Thyroid: No thyromegaly.  Cardiovascular:     Rate and Rhythm: Normal rate and regular rhythm.     Heart sounds: Normal heart sounds. No murmur heard.    No friction rub. No gallop.  Pulmonary:     Effort: No respiratory distress.     Breath sounds: Normal breath sounds. No wheezing, rhonchi or rales.  Abdominal:     General: Bowel sounds are normal.     Palpations: Abdomen is soft. There is no mass.     Tenderness: There is no abdominal tenderness. There is no guarding or rebound.  Musculoskeletal:        General: No tenderness. Normal range of motion.     Cervical back: Neck supple.  Lymphadenopathy:     Cervical: No cervical adenopathy.  Skin:    General: Skin is warm.     Findings: No rash.  Neurological:     Mental Status: He is alert.     Deep Tendon Reflexes: Reflexes are normal and symmetric.     Wt Readings from Last 3 Encounters:  01/21/23 228 lb (103.4 kg)  09/12/22 230 lb (104.3 kg)  07/27/22 230 lb (104.3 kg)    BP 118/70   Pulse 78   Ht 6' (1.829 m)   Wt 228 lb (103.4 kg)   SpO2 96%   BMI 30.92 kg/m   Assessment and Plan: 1. Essential hypertension Chronic.  Controlled.  Stable.  Blood pressure 118/70.  Asymptomatic.  Tolerating medications well.  Continue amlodipine 5 mg once a day, losartan 50 mg once a day and metoprolol XL 25 mg once a day.  Will check CMP for electrolytes  and GFR.  Will recheck  patient in 6 months. - amLODipine (NORVASC) 5 MG tablet; TAKE 1 TABLET(5 MG) BY MOUTH DAILY  Dispense: 90 tablet; Refill: 1 - losartan (COZAAR) 50 MG tablet; Take 1 tablet (50 mg total) by mouth daily.  Dispense: 90 tablet; Refill: 1 - metoprolol succinate (TOPROL-XL) 25 MG 24 hr tablet; TAKE 1 TABLET(25 MG) BY MOUTH DAILY  Dispense: 90 tablet; Refill: 1 - Comprehensive Metabolic Panel (CMET)  2. Mixed hyperlipidemia .  Controlled.  Stable.  Reemphasized dietary guidelines for low-cholesterol low triglyceride.  Continue atorvastatin 10 mg once a day.  Review of previous lipid panel is acceptable.  Will recheck in 6 months. - atorvastatin (LIPITOR) 10 MG tablet; Take 1 tablet daily  Dispense: 90 tablet; Refill: 1  3. Gastroesophageal reflux disease with esophagitis Chronic.  Stable.  Controlled.  Only with certain foods or breakthrough otherwise pantoprazole 40 mg will be continued. - pantoprazole (PROTONIX) 40 MG tablet; TAKE 1 TABLET(40 MG) BY MOUTH DAILY  Dispense: 90 tablet; Refill: 1  4. Prediabetes Chronic.  Controlled.  Stable.  Dietary control with elimination of concentrated sweets and awareness of carbohydrate intake.  Will check A1c for current level of control as well as CMP for fasting glucose.  Will recheck in 6 months - HgB A1c - Comprehensive Metabolic Panel (CMET)     Elizabeth Sauer, MD

## 2023-01-21 NOTE — Patient Instructions (Signed)

## 2023-01-22 LAB — COMPREHENSIVE METABOLIC PANEL
ALT: 20 IU/L (ref 0–44)
AST: 27 IU/L (ref 0–40)
Albumin: 4.4 g/dL (ref 3.8–4.8)
Alkaline Phosphatase: 98 IU/L (ref 44–121)
BUN/Creatinine Ratio: 11 (ref 10–24)
BUN: 14 mg/dL (ref 8–27)
Bilirubin Total: 1.2 mg/dL (ref 0.0–1.2)
CO2: 24 mmol/L (ref 20–29)
Calcium: 9.9 mg/dL (ref 8.6–10.2)
Chloride: 97 mmol/L (ref 96–106)
Creatinine, Ser: 1.27 mg/dL (ref 0.76–1.27)
Globulin, Total: 3.3 g/dL (ref 1.5–4.5)
Glucose: 106 mg/dL — ABNORMAL HIGH (ref 70–99)
Potassium: 4.8 mmol/L (ref 3.5–5.2)
Sodium: 135 mmol/L (ref 134–144)
Total Protein: 7.7 g/dL (ref 6.0–8.5)
eGFR: 60 mL/min/{1.73_m2} (ref >59)

## 2023-01-22 LAB — HEMOGLOBIN A1C
Est. average glucose Bld gHb Est-mCnc: 131 mg/dL
Hgb A1c MFr Bld: 6.2 % — ABNORMAL HIGH (ref 4.8–5.6)

## 2023-02-12 DIAGNOSIS — H2513 Age-related nuclear cataract, bilateral: Secondary | ICD-10-CM | POA: Diagnosis not present

## 2023-02-12 DIAGNOSIS — H35711 Central serous chorioretinopathy, right eye: Secondary | ICD-10-CM | POA: Diagnosis not present

## 2023-02-12 DIAGNOSIS — H353211 Exudative age-related macular degeneration, right eye, with active choroidal neovascularization: Secondary | ICD-10-CM | POA: Diagnosis not present

## 2023-03-15 DIAGNOSIS — H353211 Exudative age-related macular degeneration, right eye, with active choroidal neovascularization: Secondary | ICD-10-CM | POA: Diagnosis not present

## 2023-04-15 DIAGNOSIS — Z23 Encounter for immunization: Secondary | ICD-10-CM | POA: Diagnosis not present

## 2023-04-24 DIAGNOSIS — H353211 Exudative age-related macular degeneration, right eye, with active choroidal neovascularization: Secondary | ICD-10-CM | POA: Diagnosis not present

## 2023-05-02 DIAGNOSIS — Z23 Encounter for immunization: Secondary | ICD-10-CM | POA: Diagnosis not present

## 2023-05-19 IMAGING — US US ABDOMEN LIMITED
1 series · 14 of 25 positions shown · non-contrast
Comparison: None.

CLINICAL DATA: 70-year-old male with elevated LFTs.

EXAM:
ULTRASOUND ABDOMEN LIMITED RIGHT UPPER QUADRANT

[Series 1: us abdomen limited · 0.20mm/px · 14 of 25 slices shown]
[im 1/25]
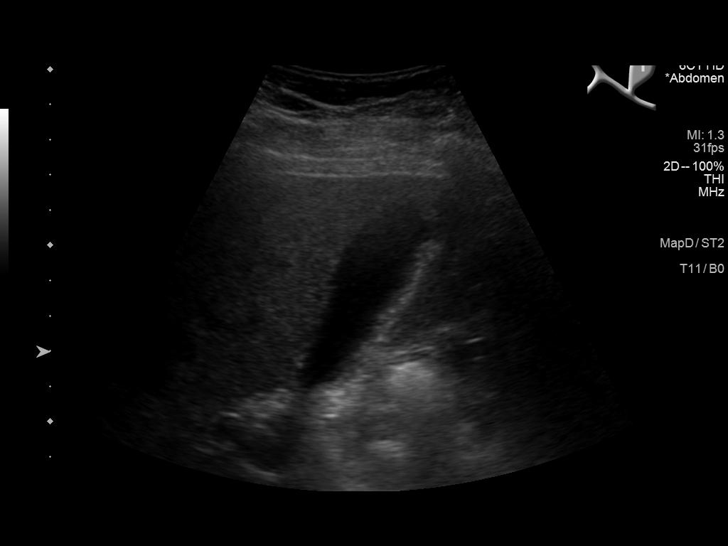
[im 3/25]
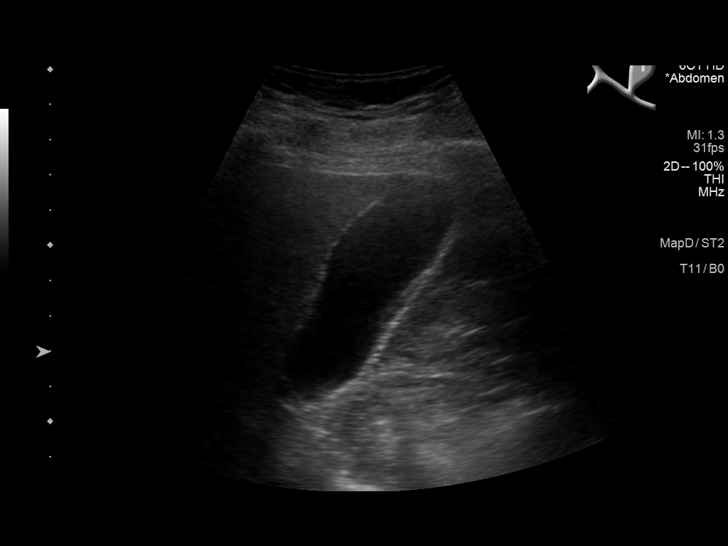
[im 5/25]
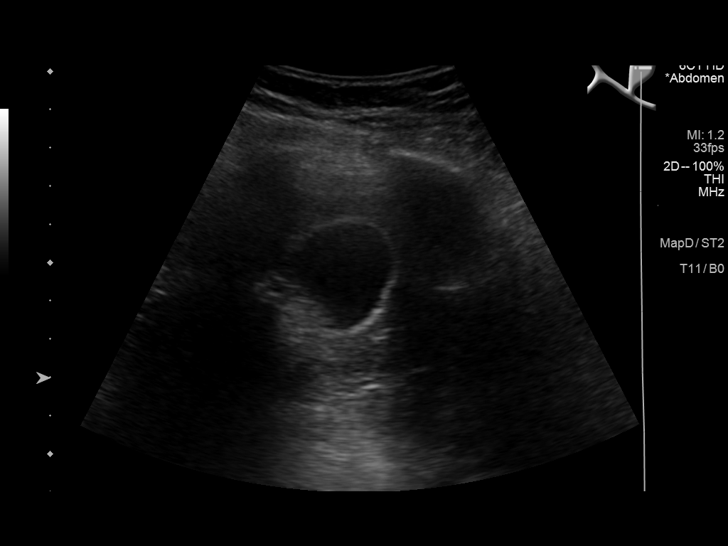
[im 7/25]
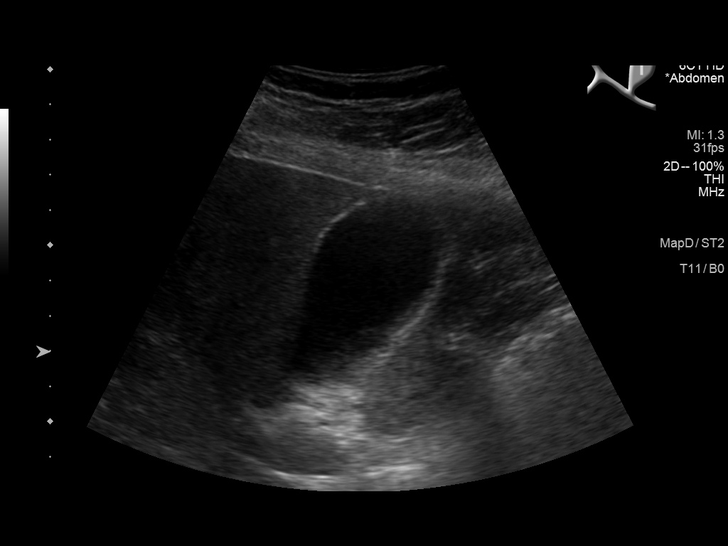
[im 9/25]
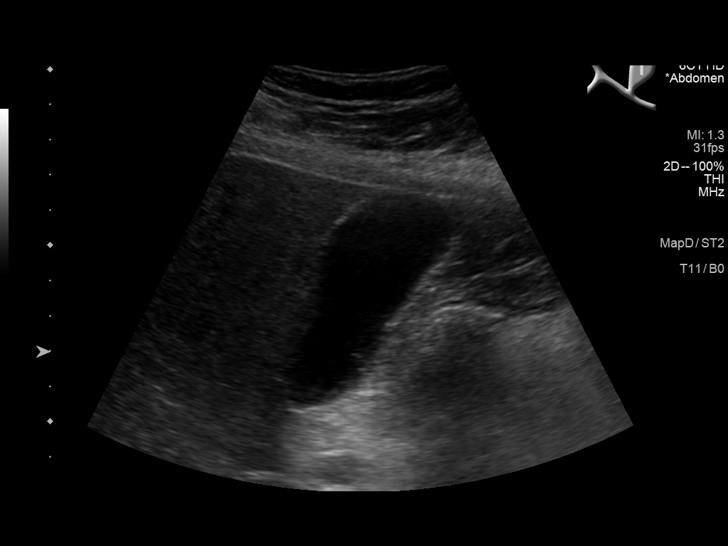
[im 10/25]
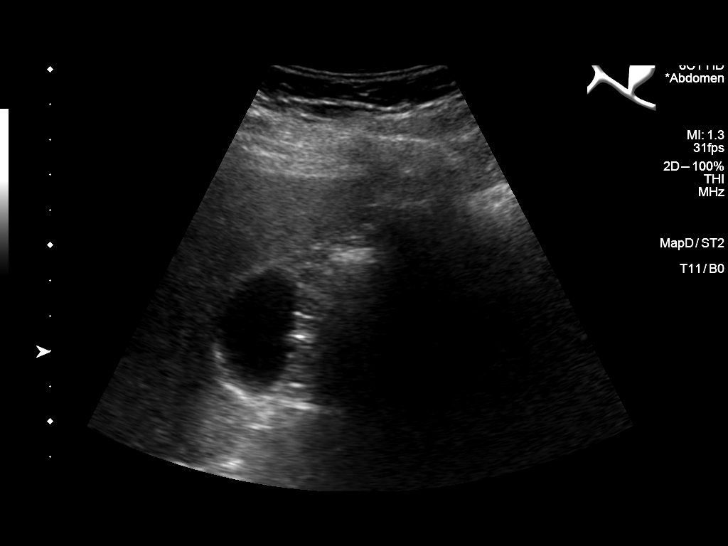
[im 12/25]
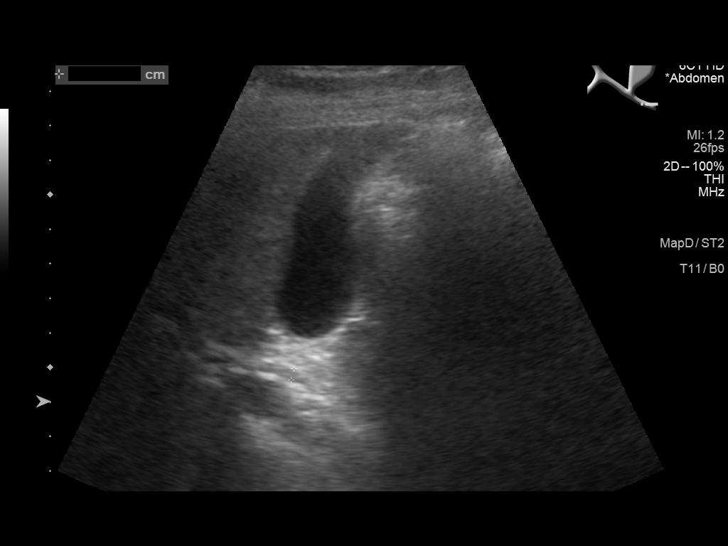
[im 14/25]
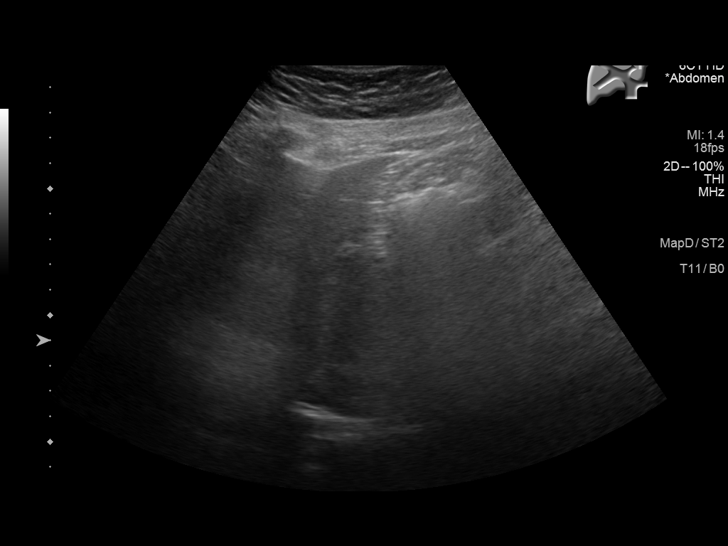
[im 16/25]
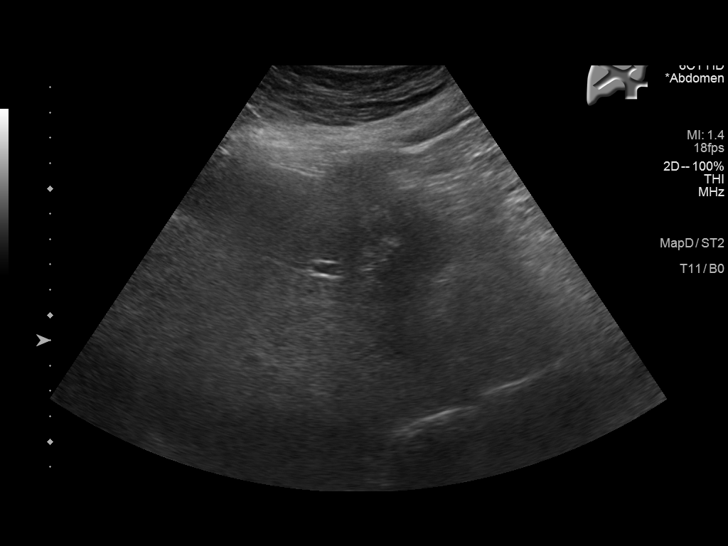
[im 17/25]
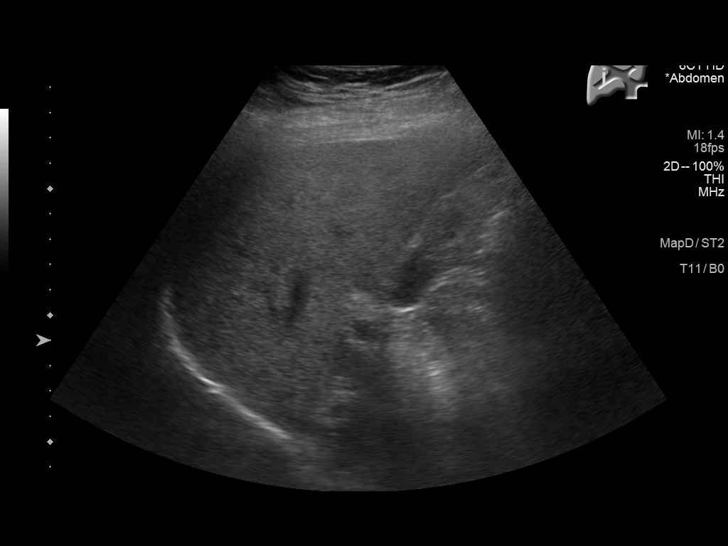
[im 19/25]
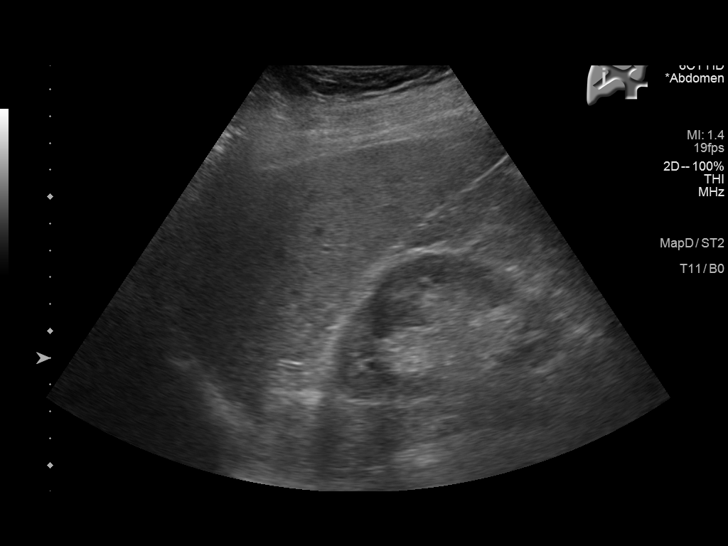
[im 21/25]
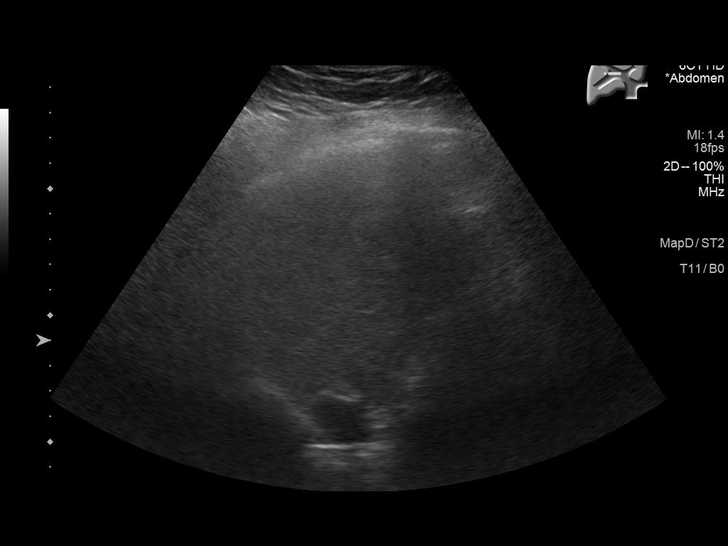
[im 23/25]
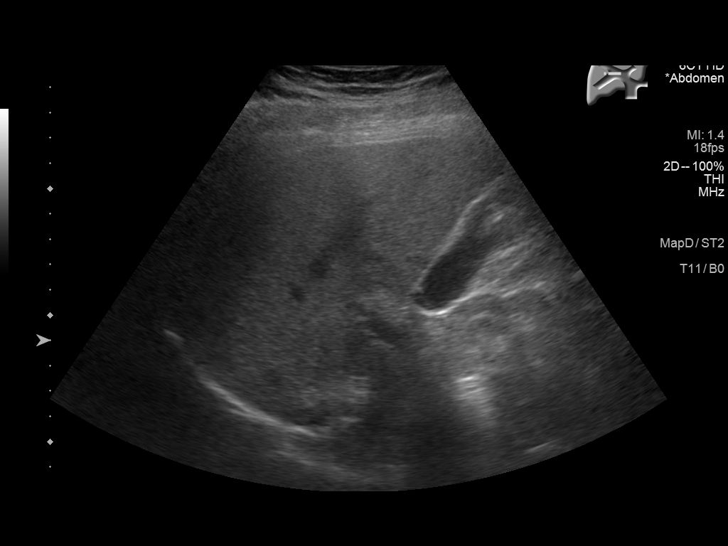
[im 25/25]
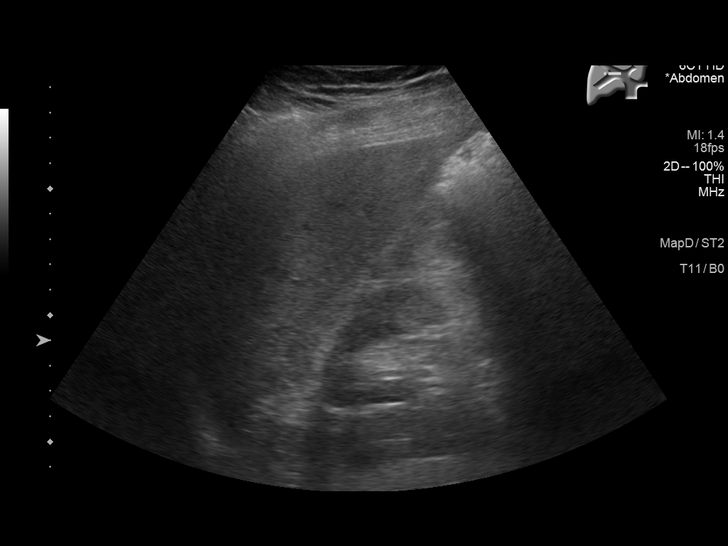

[14 of 25 positions shown; findings below may reference images not displayed]

FINDINGS: Gallbladder:

The gallbladder is unremarkable. There is no evidence of
cholelithiasis or acute cholecystitis.

Common bile duct:

Diameter: 3.4 mm. There is no evidence of intrahepatic or
extrahepatic biliary dilatation.

Liver:

Increased hepatic echogenicity noted compatible with hepatic
steatosis. No focal hepatic abnormalities are identified. Portal
vein is patent on color Doppler imaging with normal direction of
blood flow towards the liver.

Other: None.
IMPRESSION: 1. Hepatic steatosis.
2. Unremarkable gallbladder. No evidence of biliary dilatation.

## 2023-05-28 DIAGNOSIS — H353211 Exudative age-related macular degeneration, right eye, with active choroidal neovascularization: Secondary | ICD-10-CM | POA: Diagnosis not present

## 2023-07-22 DIAGNOSIS — H353211 Exudative age-related macular degeneration, right eye, with active choroidal neovascularization: Secondary | ICD-10-CM | POA: Diagnosis not present

## 2023-07-25 ENCOUNTER — Ambulatory Visit: Payer: Medicare Other | Admitting: Family Medicine

## 2023-08-01 ENCOUNTER — Encounter: Payer: Self-pay | Admitting: Family Medicine

## 2023-08-01 ENCOUNTER — Ambulatory Visit (INDEPENDENT_AMBULATORY_CARE_PROVIDER_SITE_OTHER): Payer: Medicare Other | Admitting: Family Medicine

## 2023-08-01 VITALS — BP 120/68 | HR 78 | Ht 72.0 in | Wt 229.0 lb

## 2023-08-01 DIAGNOSIS — I10 Essential (primary) hypertension: Secondary | ICD-10-CM | POA: Diagnosis not present

## 2023-08-01 DIAGNOSIS — E782 Mixed hyperlipidemia: Secondary | ICD-10-CM | POA: Diagnosis not present

## 2023-08-01 DIAGNOSIS — K2100 Gastro-esophageal reflux disease with esophagitis, without bleeding: Secondary | ICD-10-CM

## 2023-08-01 DIAGNOSIS — R351 Nocturia: Secondary | ICD-10-CM | POA: Diagnosis not present

## 2023-08-01 DIAGNOSIS — R7303 Prediabetes: Secondary | ICD-10-CM

## 2023-08-01 DIAGNOSIS — L739 Follicular disorder, unspecified: Secondary | ICD-10-CM

## 2023-08-01 MED ORDER — AMLODIPINE BESYLATE 5 MG PO TABS: ORAL_TABLET | ORAL | 1 refills | Status: DC

## 2023-08-01 MED ORDER — ATORVASTATIN CALCIUM 10 MG PO TABS: ORAL_TABLET | ORAL | 1 refills | Status: DC

## 2023-08-01 MED ORDER — LOSARTAN POTASSIUM 50 MG PO TABS: 50.0000 mg | ORAL_TABLET | Freq: Every day | ORAL | 1 refills | Status: DC

## 2023-08-01 MED ORDER — METOPROLOL SUCCINATE ER 25 MG PO TB24
ORAL_TABLET | ORAL | 1 refills | Status: DC
Start: 2023-08-01 — End: 2023-10-17

## 2023-08-01 MED ORDER — DOXYCYCLINE HYCLATE 100 MG PO TABS: 100.0000 mg | ORAL_TABLET | Freq: Two times a day (BID) | ORAL | 0 refills | Status: DC

## 2023-08-01 MED ORDER — PANTOPRAZOLE SODIUM 40 MG PO TBEC
DELAYED_RELEASE_TABLET | ORAL | 1 refills | Status: DC
Start: 2023-08-01 — End: 2023-10-17

## 2023-08-01 NOTE — Patient Instructions (Signed)

## 2023-08-01 NOTE — Progress Notes (Signed)
 Date:  08/01/2023   Name:  Juan Moreno   DOB:  11-13-1950   MRN:  [id number]   Chief Complaint: Hypertension, Hyperlipidemia, and Medication Refill  Hypertension This is a chronic problem. The current episode started more than 1 year ago. The problem has been gradually improving since onset. The problem is controlled. Pertinent negatives include no blurred vision, chest pain, malaise/fatigue, orthopnea, palpitations, peripheral edema, PND, shortness of breath or sweats. There are no associated agents to hypertension. There are no known risk factors for coronary artery disease. Past treatments include beta blockers, angiotensin blockers and calcium  channel blockers. The current treatment provides moderate improvement. There are no compliance problems.  There is no history of kidney disease, CAD/MI, heart failure, left ventricular hypertrophy or retinopathy. There is no history of chronic renal disease, a hypertension causing med or renovascular disease.  Hyperlipidemia This is a chronic problem. The current episode started more than 1 year ago. The problem is controlled. Recent lipid tests were reviewed and are normal. He has no history of chronic renal disease. Pertinent negatives include no chest pain, focal sensory loss, focal weakness, leg pain or shortness of breath. The current treatment provides moderate improvement of lipids. There are no compliance problems.  Risk factors for coronary artery disease include dyslipidemia, diabetes mellitus and hypertension.  Gastroesophageal Reflux He reports no abdominal pain, no chest pain, no choking, no coughing, no dysphagia, no heartburn, no nausea, no sore throat, no tooth decay or no wheezing. The current episode started more than 1 year ago. The problem has been gradually improving. The symptoms are aggravated by certain foods. Pertinent negatives include no anemia, fatigue, melena, muscle weakness, orthopnea or weight loss. He has tried a PPI  for the symptoms. The treatment provided mild relief.    Lab Results  Component Value Date   NA 135 01/21/2023   K 4.8 01/21/2023   CO2 24 01/21/2023   GLUCOSE 106 (H) 01/21/2023   BUN 14 01/21/2023   CREATININE 1.27 01/21/2023   CALCIUM  9.9 01/21/2023   EGFR 60 01/21/2023   GFRNONAA 54 (L) 11/01/2020   Lab Results  Component Value Date   CHOL 112 07/20/2022   HDL 41 07/20/2022   LDLCALC 47 07/20/2022   TRIG 140 07/20/2022   CHOLHDL 2.6 02/16/2019   Lab Results  Component Value Date   TSH 1.943 02/12/2018   Lab Results  Component Value Date   HGBA1C 6.2 (H) 01/21/2023   Lab Results  Component Value Date   WBC 11.0 (H) 11/01/2020   HGB 16.3 11/01/2020   HCT 47.0 11/01/2020   MCV 84.7 11/01/2020   PLT 265 11/01/2020   Lab Results  Component Value Date   ALT 20 01/21/2023   AST 27 01/21/2023   ALKPHOS 98 01/21/2023   BILITOT 1.2 01/21/2023   No results found for: 25OHVITD2, 25OHVITD3, VD25OH   Review of Systems  Constitutional:  Negative for fatigue, malaise/fatigue, unexpected weight change and weight loss.  HENT:  Negative for congestion, nosebleeds, postnasal drip, rhinorrhea, sinus pressure, sinus pain, sneezing and sore throat.   Eyes:  Negative for blurred vision and visual disturbance.  Respiratory:  Negative for apnea, cough, choking, chest tightness, shortness of breath, wheezing and stridor.   Cardiovascular:  Negative for chest pain, palpitations, orthopnea, leg swelling and PND.  Gastrointestinal:  Negative for abdominal distention, abdominal pain, dysphagia, heartburn, melena and nausea.  Endocrine: Negative for polydipsia and polyuria.  Genitourinary:  Negative for difficulty urinating.  Musculoskeletal:  Negative for muscle weakness.  Neurological:  Negative for focal weakness.    Patient Active Problem List   Diagnosis Date Noted   Pain of left calf 03/23/2022   Neck abscess 03/23/2022   History of colonic polyps    Polyp of  ascending colon    Vertigo 02/12/2018   Colon cancer screening 09/17/2016   Obesity 04/11/2015   Essential hypertension 03/21/2015   Hyperlipidemia 03/21/2015   Gastroesophageal reflux disease with esophagitis 03/21/2015    Allergies  Allergen Reactions   Penicillins Rash    Past Surgical History:  Procedure Laterality Date   COLONOSCOPY  2013   cleared for 4 yrs- Dr Viktoria- Divert   COLONOSCOPY WITH PROPOFOL  N/A 12/26/2016   Procedure: COLONOSCOPY WITH PROPOFOL ;  Surgeon: Viktoria Lamar DASEN, MD;  Location: [facility] ENDOSCOPY;  Service: Endoscopy;  Laterality: N/A;   COLONOSCOPY WITH PROPOFOL  N/A 12/14/2021   Procedure: COLONOSCOPY WITH PROPOFOL ;  Surgeon: Unk Corinn Skiff, MD;  Location: [facility];  Service: Endoscopy;  Laterality: N/A;   POLYPECTOMY  12/14/2021   Procedure: POLYPECTOMY;  Surgeon: Unk Corinn Skiff, MD;  Location: [facility];  Service: Endoscopy;;   STAPEDES SURGERY      Social History   Tobacco Use   Smoking status: Never    Passive exposure: Never   Smokeless tobacco: Former    Types: Chew  Vaping Use   Vaping status: Never Used  Substance Use Topics   Alcohol use: Yes    Alcohol/week: 0.0 standard drinks of alcohol    Comment: rare   Drug use: No     Medication list has been reviewed and updated.  Current Meds  Medication Sig   amLODipine  (NORVASC ) 5 MG tablet TAKE 1 TABLET(5 MG) BY MOUTH DAILY   atorvastatin  (LIPITOR) 10 MG tablet Take 1 tablet daily   loratadine  (CLARITIN ) 10 MG tablet Take 10 mg by mouth daily. otc   losartan  (COZAAR ) 50 MG tablet Take 1 tablet (50 mg total) by mouth daily.   metoprolol  succinate (TOPROL -XL) 25 MG 24 hr tablet TAKE 1 TABLET(25 MG) BY MOUTH DAILY   pantoprazole  (PROTONIX ) 40 MG tablet TAKE 1 TABLET(40 MG) BY MOUTH DAILY       08/01/2023    7:58 AM 01/21/2023    8:00 AM 07/27/2022    3:54 PM 07/20/2022    8:03 AM  GAD 7 : Generalized Anxiety Score  Nervous, Anxious, on Edge 0 0 0 0   Control/stop worrying 0 0 0 0  Worry too much - different things 0 0 0 0  Trouble relaxing 0 0 0 0  Restless 0 0 0 0  Easily annoyed or irritable 0 0 0 0  Afraid - awful might happen 0 0 0 0  Total GAD 7 Score 0 0 0 0  Anxiety Difficulty Not difficult at all Not difficult at all Not difficult at all Not difficult at all       08/01/2023    7:58 AM 01/21/2023    8:00 AM 09/12/2022    9:31 AM  Depression screen PHQ 2/9  Decreased Interest 0 0 0  Down, Depressed, Hopeless 0 0 0  PHQ - 2 Score 0 0 0  Altered sleeping 0 0 0  Tired, decreased energy 0 0 0  Change in appetite 0 0 0  Feeling bad or failure about yourself  0 0 0  Trouble concentrating 0 0 0  Moving slowly or fidgety/restless 0 0 0  Suicidal thoughts 0 0 0  PHQ-9 Score 0 0 0  Difficult doing work/chores Not difficult at all Not difficult at all Not difficult at all    BP Readings from Last 3 Encounters:  08/01/23 120/68  01/21/23 118/70  07/27/22 128/79    Physical Exam Vitals and nursing note reviewed.  HENT:     Head: Normocephalic.     Right Ear: External ear normal.     Left Ear: External ear normal.     Nose: Nose normal.  Eyes:     General: No scleral icterus.       Right eye: No discharge.        Left eye: No discharge.     Conjunctiva/sclera: Conjunctivae normal.     Pupils: Pupils are equal, round, and reactive to light.  Neck:     Thyroid : No thyromegaly.     Vascular: No JVD.     Trachea: No tracheal deviation.  Cardiovascular:     Rate and Rhythm: Normal rate and regular rhythm.     Heart sounds: Normal heart sounds. No murmur heard.    No friction rub. No gallop.  Pulmonary:     Effort: No respiratory distress.     Breath sounds: Normal breath sounds. No wheezing or rales.  Abdominal:     General: Bowel sounds are normal.     Palpations: Abdomen is soft. There is no mass.     Tenderness: There is no abdominal tenderness. There is no guarding or rebound.  Genitourinary:    Prostate:  Normal. Not enlarged, not tender and no nodules present.     Rectum: Normal. Guaiac result negative. No mass.  Musculoskeletal:        General: No tenderness. Normal range of motion.     Cervical back: Normal range of motion and neck supple.  Lymphadenopathy:     Cervical: No cervical adenopathy.  Skin:    General: Skin is warm.     Findings: Abscess and erythema present. No rash.  Neurological:     Mental Status: He is alert.     Wt Readings from Last 3 Encounters:  08/01/23 229 lb (103.9 kg)  01/21/23 228 lb (103.4 kg)  09/12/22 230 lb (104.3 kg)    BP 120/68   Pulse 78   Ht 6' (1.829 m)   Wt 229 lb (103.9 kg)   SpO2 98%   BMI 31.06 kg/m   Assessment and Plan: 1. Folliculitis Acute.  Persistent.  Area burst and has some drainage there is some induration and erythema on his right buttock and we will treat for mild cellulitis with doxycycline  100 mg twice a day.  2. Nocturia With history of nocturia without hesitation or decreased stream.  Will check PSA.  DRE was done and it was unremarkable for size shape consistency. - PSA  3. Essential hypertension (Primary) .  Controlled.  Stable.  Blood pressure 120/68.  Asymptomatic.  Tolerating medication well.  Continue amlodipine  5 mg once a day, losartan  50 mg once a day and metoprolol  XL 25 mg once a day.  Will check renal function panel for electrolytes and GFR.  Will recheck patient in 6 months. - Renal Function Panel - amLODipine  (NORVASC ) 5 MG tablet; TAKE 1 TABLET(5 MG) BY MOUTH DAILY  Dispense: 90 tablet; Refill: 1 - losartan  (COZAAR ) 50 MG tablet; Take 1 tablet (50 mg total) by mouth daily.  Dispense: 90 tablet; Refill: 1 - metoprolol  succinate (TOPROL -XL) 25 MG 24 hr tablet; TAKE 1 TABLET(25 MG) BY MOUTH DAILY  Dispense: 90 tablet; Refill: 1  4. Mixed hyperlipidemia Chronic.  Controlled.  Stable.  Continue atorvastatin  10 mg once a day.  Will check lipid panel for current level of LDL control. - Lipid Panel With  LDL/HDL Ratio - atorvastatin  (LIPITOR) 10 MG tablet; Take 1 tablet daily  Dispense: 90 tablet; Refill: 1  5. Gastroesophageal reflux disease with esophagitis Chronic.  Controlled.  Stable.  Asymptomatic.  Continue pantoprazole  40 mg once a day. - pantoprazole  (PROTONIX ) 40 MG tablet; TAKE 1 TABLET(40 MG) BY MOUTH DAILY  Dispense: 90 tablet; Refill: 1  6. Prediabetes Chronic.  Controlled.  Stable.  Last A1c was 6.2 will recheck Patient has been given guidelines for low glycemic food choices and we will proceed with the dietary approach until A1c is in uncontrolled diabetic range.  We will also check renal function panel for fasting glucose.  Will recheck patient in 6 months. - Renal Function Panel - Hemoglobin A1c     Cathryne Molt, MD

## 2023-08-02 ENCOUNTER — Encounter: Payer: Self-pay | Admitting: Family Medicine

## 2023-08-02 LAB — RENAL FUNCTION PANEL
Albumin: 4.3 g/dL (ref 3.8–4.8)
BUN/Creatinine Ratio: 13 (ref 10–24)
BUN: 17 mg/dL (ref 8–27)
CO2: 24 mmol/L (ref 20–29)
Calcium: 9.4 mg/dL (ref 8.6–10.2)
Chloride: 98 mmol/L (ref 96–106)
Creatinine, Ser: 1.33 mg/dL — ABNORMAL HIGH (ref 0.76–1.27)
Glucose: 96 mg/dL (ref 70–99)
Phosphorus: 3.5 mg/dL (ref 2.8–4.1)
Potassium: 4.6 mmol/L (ref 3.5–5.2)
Sodium: 136 mmol/L (ref 134–144)
eGFR: 56 mL/min/{1.73_m2} — ABNORMAL LOW (ref >59)

## 2023-08-02 LAB — HEMOGLOBIN A1C
Est. average glucose Bld gHb Est-mCnc: 137 mg/dL
Hgb A1c MFr Bld: 6.4 % — ABNORMAL HIGH (ref 4.8–5.6)

## 2023-08-02 LAB — LIPID PANEL WITH LDL/HDL RATIO
Cholesterol, Total: 115 mg/dL (ref 100–199)
HDL: 40 mg/dL (ref >39)
LDL Chol Calc (NIH): 54 mg/dL (ref 0–99)
LDL/HDL Ratio: 1.4 {ratio} (ref 0.0–3.6)
Triglycerides: 115 mg/dL (ref 0–149)
VLDL Cholesterol Cal: 21 mg/dL (ref 5–40)

## 2023-08-02 LAB — PSA: Prostate Specific Ag, Serum: 1 ng/mL (ref 0.0–4.0)

## 2023-08-07 DIAGNOSIS — Z872 Personal history of diseases of the skin and subcutaneous tissue: Secondary | ICD-10-CM | POA: Diagnosis not present

## 2023-08-07 DIAGNOSIS — Z86018 Personal history of other benign neoplasm: Secondary | ICD-10-CM | POA: Diagnosis not present

## 2023-08-07 DIAGNOSIS — L578 Other skin changes due to chronic exposure to nonionizing radiation: Secondary | ICD-10-CM | POA: Diagnosis not present

## 2023-08-07 DIAGNOSIS — Z85828 Personal history of other malignant neoplasm of skin: Secondary | ICD-10-CM | POA: Diagnosis not present

## 2023-08-07 DIAGNOSIS — L570 Actinic keratosis: Secondary | ICD-10-CM | POA: Diagnosis not present

## 2023-08-07 DIAGNOSIS — Z859 Personal history of malignant neoplasm, unspecified: Secondary | ICD-10-CM | POA: Diagnosis not present

## 2023-09-17 DIAGNOSIS — H353211 Exudative age-related macular degeneration, right eye, with active choroidal neovascularization: Secondary | ICD-10-CM | POA: Diagnosis not present

## 2023-09-18 ENCOUNTER — Ambulatory Visit: Payer: Medicare Other

## 2023-09-18 DIAGNOSIS — Z0000 Encounter for general adult medical examination without abnormal findings: Secondary | ICD-10-CM

## 2023-09-18 NOTE — Progress Notes (Signed)
 Subjective:   Juan Moreno is a 73 y.o. male who presents for Medicare Annual/Subsequent preventive examination.  Visit Complete: Virtual I connected with  Juan Moreno on 09/18/23 by a audio enabled telemedicine application and verified that I am speaking with the correct person using two identifiers.  This patient declined Interactive audio and Acupuncturist. Therefore the visit was completed with audio only.   Patient Location: Home  Provider Location: Office/Clinic  I discussed the limitations of evaluation and management by telemedicine. The patient expressed understanding and agreed to proceed.  Vital Signs: Because this visit was a virtual/telehealth visit, some criteria may be missing or patient reported. Any vitals not documented were not able to be obtained and vitals that have been documented are patient reported.  Cardiac Risk Factors include: advanced age (>29men, >29 women);dyslipidemia;hypertension;male gender;obesity (BMI >30kg/m2)     Objective:    There were no vitals filed for this visit. There is no height or weight on file to calculate BMI.     09/18/2023    8:53 AM 09/12/2022    9:32 AM 12/14/2021    7:06 AM 09/06/2021    9:24 AM 11/01/2020    4:08 PM 09/05/2020    9:34 AM 09/02/2019    9:35 AM  Advanced Directives  Does Patient Have a Medical Advance Directive? Yes Yes Yes Yes No No No  Type of Estate agent of [place];Living will Healthcare Power of [place];Living will Healthcare Power of [place];Living will Healthcare Power of [place];Living will     Does patient want to make changes to medical advance directive? No - Patient declined No - Patient declined No - Patient declined      Copy of Healthcare Power of Attorney in Chart? Yes - validated most recent copy scanned in chart (See row information) Yes - validated most recent copy scanned in chart (See row information) No - copy requested Yes - validated  most recent copy scanned in chart (See row information)     Would patient like information on creating a medical advance directive?      Yes (MAU/Ambulatory/Procedural Areas - Information given) Yes (MAU/Ambulatory/Procedural Areas - Information given)    Current Medications (verified) Outpatient Encounter Medications as of 09/18/2023  Medication Sig   amLODipine (NORVASC) 5 MG tablet TAKE 1 TABLET(5 MG) BY MOUTH DAILY   atorvastatin (LIPITOR) 10 MG tablet Take 1 tablet daily   loratadine (CLARITIN) 10 MG tablet Take 10 mg by mouth daily. otc   losartan (COZAAR) 50 MG tablet Take 1 tablet (50 mg total) by mouth daily.   metoprolol succinate (TOPROL-XL) 25 MG 24 hr tablet TAKE 1 TABLET(25 MG) BY MOUTH DAILY   pantoprazole (PROTONIX) 40 MG tablet TAKE 1 TABLET(40 MG) BY MOUTH DAILY   doxycycline (VIBRA-TABS) 100 MG tablet Take 1 tablet (100 mg total) by mouth 2 (two) times daily. (Patient not taking: Reported on 09/18/2023)   No facility-administered encounter medications on file as of 09/18/2023.    Allergies (verified) Penicillins   History: Past Medical History:  Diagnosis Date   Arthritis    GERD (gastroesophageal reflux disease)    Hyperlipidemia    Hypertension    Retinopathy of right eye    Wears hearing aid    Past Surgical History:  Procedure Laterality Date   COLONOSCOPY  2013   cleared for 4 yrs- Dr Mechele Collin- Divert   COLONOSCOPY WITH PROPOFOL N/A 12/26/2016   Procedure: COLONOSCOPY WITH PROPOFOL;  Surgeon: Scot Jun, MD;  Location: [facility] ENDOSCOPY;  Service: Endoscopy;  Laterality: N/A;   COLONOSCOPY WITH PROPOFOL N/A 12/14/2021   Procedure: COLONOSCOPY WITH PROPOFOL;  Surgeon: Toney Reil, MD;  Location: [facility];  Service: Endoscopy;  Laterality: N/A;   POLYPECTOMY  12/14/2021   Procedure: POLYPECTOMY;  Surgeon: Toney Reil, MD;  Location: [facility];  Service: Endoscopy;;   STAPEDES SURGERY     Family History  Problem  Relation Age of Onset   Hypertension Mother    Hypertension Father    Social History   Socioeconomic History   Marital status: Widowed    Spouse name: Not on file   Number of children: 0   Years of education: Not on file   Highest education level: Not on file  Occupational History   Not on file  Tobacco Use   Smoking status: Never    Passive exposure: Never   Smokeless tobacco: Former    Types: Chew  Vaping Use   Vaping status: Never Used  Substance and Sexual Activity   Alcohol use: Yes    Alcohol/week: 0.0 standard drinks of alcohol    Comment: rare   Drug use: No   Sexual activity: Never  Other Topics Concern   Not on file  Social History Narrative   Pt lives alone   Social Drivers of Health   Financial Resource Strain: Low Risk  (09/18/2023)   Overall Financial Resource Strain (CARDIA)    Difficulty of Paying Living Expenses: Not hard at all  Food Insecurity: No Food Insecurity (09/18/2023)   Hunger Vital Sign    Worried About Running Out of Food in the Last Year: Never true    Ran Out of Food in the Last Year: Never true  Transportation Needs: No Transportation Needs (09/18/2023)   PRAPARE - Administrator, Civil Service (Medical): No    Lack of Transportation (Non-Medical): No  Physical Activity: Sufficiently Active (09/18/2023)   Exercise Vital Sign    Days of Exercise per Week: 7 days    Minutes of Exercise per Session: 60 min  Stress: No Stress Concern Present (09/18/2023)   [employer] of Occupational Health - Occupational Stress Questionnaire    Feeling of Stress : Not at all  Social Connections: Moderately Isolated (09/18/2023)   Social Connection and Isolation Panel [NHANES]    Frequency of Communication with Friends and Family: More than three times a week    Frequency of Social Gatherings with Friends and Family: Twice a week    Attends Religious Services: More than 4 times per year    Active Member of [employer] or Organizations: No     Attends Banker Meetings: Never    Marital Status: Widowed    Tobacco Counseling Counseling given: Not Answered   Clinical Intake:  Pre-visit preparation completed: Yes  Pain : No/denies pain     BMI - recorded: 31.1 Nutritional Status: BMI > 30  Obese Nutritional Risks: None Diabetes: No  How often do you need to have someone help you when you read instructions, pamphlets, or other written materials from your doctor or pharmacy?: 1 - Never  Interpreter Needed?: No  Information entered by :: Kennedy Bucker, LPN   Activities of Daily Living    09/18/2023    8:55 AM  In your present state of health, do you have any difficulty performing the following activities:  Hearing? 1  Vision? 0  Difficulty concentrating or making decisions? 0  Walking or climbing stairs?  0  Dressing or bathing? 0  Doing errands, shopping? 0  Preparing Food and eating ? N  Using the Toilet? N  In the past six months, have you accidently leaked urine? N  Do you have problems with loss of bowel control? N  Managing your Medications? N  Managing your Finances? N  Housekeeping or managing your Housekeeping? N    Patient Care Team: Duanne Limerick, MD as PCP - General (Family Medicine) [facility], [facility] (Optometry)  Indicate any recent Medical Services you may have received from other than [facility] providers in the past year (date may be approximate).     Assessment:   This is a routine wellness examination for Juan Moreno.  Hearing/Vision screen Hearing Screening - Comments:: WEARS AIDS, BOTH EARS  Vision Screening - Comments:: READERS- [facility]   Goals Addressed             This Visit's Progress    DIET - INCREASE WATER INTAKE         Depression Screen    09/18/2023    8:52 AM 08/01/2023    7:58 AM 01/21/2023    8:00 AM 09/12/2022    9:31 AM 07/27/2022    3:54 PM 07/20/2022    8:02 AM 01/18/2022    8:07 AM  PHQ 2/9 Scores  PHQ - 2 Score 0 0 0 0 0 0 0  PHQ- 9  Score 0 0 0 0 0 0 0    Fall Risk    09/18/2023    8:55 AM 08/01/2023    7:58 AM 01/21/2023    7:59 AM 09/12/2022    9:33 AM 07/27/2022    3:54 PM  Fall Risk   Falls in the past year? 0 0 0 0 0  Number falls in past yr: 0 0 0 0 0  Injury with Fall? 0 0 0 0 0  Risk for fall due to : No Fall Risks No Fall Risks No Fall Risks No Fall Risks No Fall Risks  Follow up Falls prevention discussed;Falls evaluation completed Falls evaluation completed Falls evaluation completed Falls prevention discussed;Falls evaluation completed Falls evaluation completed    MEDICARE RISK AT HOME: Medicare Risk at Home Any stairs in or around the home?: No If so, are there any without handrails?: No Home free of loose throw rugs in walkways, pet beds, electrical cords, etc?: Yes Adequate lighting in your home to reduce risk of falls?: Yes Life alert?: No Use of a cane, walker or w/c?: No Grab bars in the bathroom?: No Shower chair or bench in shower?: Yes Elevated toilet seat or a handicapped toilet?: No  TIMED UP AND GO:  Was the test performed?  No    Cognitive Function:        09/18/2023    8:56 AM 09/12/2022    9:38 AM 09/02/2019    9:39 AM  6CIT Screen  What Year? 0 points 0 points 0 points  What month? 0 points 0 points 0 points  What time? 0 points 0 points 0 points  Count back from 20 0 points 0 points 0 points  Months in reverse 0 points 0 points 0 points  Repeat phrase 0 points 0 points 0 points  Total Score 0 points 0 points 0 points    Immunizations Immunization History  Administered Date(s) Administered   Fluad Quad(high Dose 65+) 04/01/2019   Fluad Trivalent(High Dose 65+) 05/02/2023   Influenza, High Dose Seasonal PF 05/08/2018   Influenza-Unspecified 04/29/2020, 04/26/2021,  04/26/2022   Moderna Sars-Covid-2 Vaccination 04/26/2022   PFIZER(Purple Top)SARS-COV-2 Vaccination 08/21/2019, 09/11/2019, 03/30/2020, 11/24/2020, 04/26/2021   Pneumococcal Conjugate-13 09/19/2015    Pneumococcal Polysaccharide-23 03/20/2017   Tdap 09/19/2015   Unspecified SARS-COV-2 Vaccination 03/31/2023   Zoster Recombinant(Shingrix) 10/18/2016, 01/08/2017    TDAP status: Up to date  Flu Vaccine status: Up to date  Pneumococcal vaccine status: Up to date  Covid-19 vaccine status: Completed vaccines  Qualifies for Shingles Vaccine? Yes   Zostavax completed No   Shingrix Completed?: Yes  Screening Tests Health Maintenance  Topic Date Due   COVID-19 Vaccine (8 - 2024-25 season) 05/26/2023   Medicare Annual Wellness (AWV)  09/17/2024   DTaP/Tdap/Td (2 - Td or Tdap) 09/18/2025   Colonoscopy  12/14/2028   Pneumonia Vaccine 61+ Years old  Completed   INFLUENZA VACCINE  Completed   Hepatitis C Screening  Completed   Zoster Vaccines- Shingrix  Completed   HPV VACCINES  Aged Out    Health Maintenance  Health Maintenance Due  Topic Date Due   COVID-19 Vaccine (8 - 2024-25 season) 05/26/2023    Colorectal cancer screening: Type of screening: Colonoscopy. Completed 12/14/21. Repeat every 7 years- AGED OUT  Lung Cancer Screening: (Low Dose CT Chest recommended if Age 84-80 years, 20 pack-year currently smoking OR have quit w/in 15years.) does not qualify.    Additional Screening:  Hepatitis C Screening: does qualify; Completed 09/17/16  Vision Screening: Recommended annual ophthalmology exams for early detection of glaucoma and other disorders of the eye. Is the patient up to date with their annual eye exam?  Yes  Who is the provider or what is the name of the office in which the patient attends annual eye exams? [facility] If pt is not established with a provider, would they like to be referred to a provider to establish care? No .   Dental Screening: Recommended annual dental exams for proper oral hygiene   Community Resource Referral / Chronic Care Management: CRR required this visit?  No   CCM required this visit?  No     Plan:     I have personally  reviewed and noted the following in the patient's chart:   Medical and social history Use of alcohol, tobacco or illicit drugs  Current medications and supplements including opioid prescriptions. Patient is not currently taking opioid prescriptions. Functional ability and status Nutritional status Physical activity Advanced directives List of other physicians Hospitalizations, surgeries, and ER visits in previous 12 months Vitals Screenings to include cognitive, depression, and falls Referrals and appointments  In addition, I have reviewed and discussed with patient certain preventive protocols, quality metrics, and best practice recommendations. A written personalized care plan for preventive services as well as general preventive health recommendations were provided to patient.     Hal Hope, LPN   11/05/8117   After Visit Summary: (MyChart) Due to this being a telephonic visit, the after visit summary with patients personalized plan was offered to patient via MyChart   Nurse Notes: NONE

## 2023-09-18 NOTE — Patient Instructions (Addendum)
 Juan Moreno , Thank you for taking time to come for your Medicare Wellness Visit. I appreciate your ongoing commitment to your health goals. Please review the following plan we discussed and let me know if I can assist you in the future.   Referrals/Orders/Follow-Ups/Clinician Recommendations: NONE  This is a list of the screening recommended for you and due dates:  Health Maintenance  Topic Date Due   COVID-19 Vaccine (8 - 2024-25 season) 05/26/2023   Medicare Annual Wellness Visit  09/17/2024   DTaP/Tdap/Td vaccine (2 - Td or Tdap) 09/18/2025   Colon Cancer Screening  12/14/2028   Pneumonia Vaccine  Completed   Flu Shot  Completed   Hepatitis C Screening  Completed   Zoster (Shingles) Vaccine  Completed   HPV Vaccine  Aged Out    Advanced directives: (In Chart) A copy of your advanced directives are scanned into your chart should your provider ever need it.  Next Medicare Annual Wellness Visit scheduled for next year: Yes   09/23/24 @ 8:50 AM BY PHONE

## 2023-10-17 ENCOUNTER — Encounter: Payer: Self-pay | Admitting: Family Medicine

## 2023-10-17 ENCOUNTER — Ambulatory Visit (INDEPENDENT_AMBULATORY_CARE_PROVIDER_SITE_OTHER): Admitting: Family Medicine

## 2023-10-17 VITALS — BP 118/78 | HR 79 | Ht 72.0 in | Wt 208.0 lb

## 2023-10-17 DIAGNOSIS — K2100 Gastro-esophageal reflux disease with esophagitis, without bleeding: Secondary | ICD-10-CM

## 2023-10-17 DIAGNOSIS — E782 Mixed hyperlipidemia: Secondary | ICD-10-CM

## 2023-10-17 DIAGNOSIS — I10 Essential (primary) hypertension: Secondary | ICD-10-CM | POA: Diagnosis not present

## 2023-10-17 DIAGNOSIS — R7303 Prediabetes: Secondary | ICD-10-CM | POA: Diagnosis not present

## 2023-10-17 MED ORDER — METOPROLOL SUCCINATE ER 25 MG PO TB24: ORAL_TABLET | ORAL | 1 refills | Status: DC

## 2023-10-17 MED ORDER — AMLODIPINE BESYLATE 5 MG PO TABS
ORAL_TABLET | ORAL | 1 refills | Status: AC
Start: 2023-10-17 — End: ?

## 2023-10-17 MED ORDER — ATORVASTATIN CALCIUM 10 MG PO TABS: ORAL_TABLET | ORAL | 1 refills | Status: DC

## 2023-10-17 MED ORDER — PANTOPRAZOLE SODIUM 40 MG PO TBEC: DELAYED_RELEASE_TABLET | ORAL | 1 refills | Status: DC

## 2023-10-17 MED ORDER — LOSARTAN POTASSIUM 50 MG PO TABS: 50.0000 mg | ORAL_TABLET | Freq: Every day | ORAL | 1 refills | Status: DC

## 2023-10-17 NOTE — Patient Instructions (Signed)

## 2023-10-17 NOTE — Progress Notes (Signed)
 Date:  10/17/2023   Name:  Juan Moreno   DOB:  10-27-1950   MRN:  [id number]   Chief Complaint: Diabetes (Patient would like to discuss A1c level and medication refills. )  Diabetes He presents for his follow-up diabetic visit. Diabetes type: prediabetes. His disease course has been stable. There are no hypoglycemic associated symptoms. Pertinent negatives for diabetes include no blurred vision, no chest pain, no fatigue, no foot paresthesias, no foot ulcerations, no polydipsia, no polyphagia, no polyuria, no visual change, no weakness and no weight loss. There are no hypoglycemic complications. Symptoms are stable. There are no diabetic complications. Risk factors for coronary artery disease include diabetes mellitus, dyslipidemia and hypertension. Current diabetic treatment includes diet. His weight is decreasing steadily (by design). He is following a generally healthy diet. Meal planning includes avoidance of concentrated sweets and carbohydrate counting. He participates in exercise daily. His breakfast blood glucose is taken between 7-8 am.  Hypertension Pertinent negatives include no blurred vision, chest pain, palpitations or shortness of breath.  Hyperlipidemia Pertinent negatives include no chest pain or shortness of breath.    Lab Results  Component Value Date   NA 136 08/01/2023   K 4.6 08/01/2023   CO2 24 08/01/2023   GLUCOSE 96 08/01/2023   BUN 17 08/01/2023   CREATININE 1.33 (H) 08/01/2023   CALCIUM 9.4 08/01/2023   EGFR 56 (L) 08/01/2023   GFRNONAA 54 (L) 11/01/2020   Lab Results  Component Value Date   CHOL 115 08/01/2023   HDL 40 08/01/2023   LDLCALC 54 08/01/2023   TRIG 115 08/01/2023   CHOLHDL 2.6 02/16/2019   Lab Results  Component Value Date   TSH 1.943 02/12/2018   Lab Results  Component Value Date   HGBA1C 6.4 (H) 08/01/2023   Lab Results  Component Value Date   WBC 11.0 (H) 11/01/2020   HGB 16.3 11/01/2020   HCT 47.0 11/01/2020    MCV 84.7 11/01/2020   PLT 265 11/01/2020   Lab Results  Component Value Date   ALT 20 01/21/2023   AST 27 01/21/2023   ALKPHOS 98 01/21/2023   BILITOT 1.2 01/21/2023   No results found for: "25OHVITD2", "25OHVITD3", "VD25OH"   Review of Systems  Constitutional:  Negative for fatigue and weight loss.  Eyes:  Negative for blurred vision.  Respiratory:  Negative for chest tightness, shortness of breath and wheezing.   Cardiovascular:  Negative for chest pain, palpitations and leg swelling.  Gastrointestinal:  Negative for abdominal distention.  Endocrine: Negative for polydipsia, polyphagia and polyuria.  Genitourinary:  Negative for difficulty urinating.  Neurological:  Negative for weakness.    Patient Active Problem List   Diagnosis Date Noted   Pain of left calf 03/23/2022   Neck abscess 03/23/2022   History of colonic polyps    Polyp of ascending colon    Vertigo 02/12/2018   Colon cancer screening 09/17/2016   Obesity 04/11/2015   Essential hypertension 03/21/2015   Hyperlipidemia 03/21/2015   Gastroesophageal reflux disease with esophagitis 03/21/2015    Allergies  Allergen Reactions   Penicillins Rash    Past Surgical History:  Procedure Laterality Date   COLONOSCOPY  2013   cleared for 4 yrs- Dr Mechele Collin- [place]   COLONOSCOPY WITH PROPOFOL N/A 12/26/2016   Procedure: COLONOSCOPY WITH PROPOFOL;  Surgeon: Scot Jun, MD;  Location: [facility] ENDOSCOPY;  Service: Endoscopy;  Laterality: N/A;   COLONOSCOPY WITH PROPOFOL N/A 12/14/2021   Procedure: COLONOSCOPY WITH PROPOFOL;  Surgeon: Toney Reil, MD;  Location: [facility];  Service: Endoscopy;  Laterality: N/A;   POLYPECTOMY  12/14/2021   Procedure: POLYPECTOMY;  Surgeon: Toney Reil, MD;  Location: [facility];  Service: Endoscopy;;   STAPEDES SURGERY      Social History   Tobacco Use   Smoking status: Never    Passive exposure: Never   Smokeless tobacco: Former    Types:  Chew  Vaping Use   Vaping status: Never Used  Substance Use Topics   Alcohol use: Yes    Alcohol/week: 0.0 standard drinks of alcohol    Comment: rare   Drug use: No     Medication list has been reviewed and updated.  Current Meds  Medication Sig   amLODipine (NORVASC) 5 MG tablet TAKE 1 TABLET(5 MG) BY MOUTH DAILY   atorvastatin (LIPITOR) 10 MG tablet Take 1 tablet daily   loratadine (CLARITIN) 10 MG tablet Take 10 mg by mouth daily. otc   losartan (COZAAR) 50 MG tablet Take 1 tablet (50 mg total) by mouth daily.   metoprolol succinate (TOPROL-XL) 25 MG 24 hr tablet TAKE 1 TABLET(25 MG) BY MOUTH DAILY   pantoprazole (PROTONIX) 40 MG tablet TAKE 1 TABLET(40 MG) BY MOUTH DAILY       10/17/2023    8:41 AM 08/01/2023    7:58 AM 01/21/2023    8:00 AM 07/27/2022    3:54 PM  GAD 7 : Generalized Anxiety Score  Nervous, Anxious, on Edge 0 0 0 0  Control/stop worrying 0 0 0 0  Worry too much - different things 0 0 0 0  Trouble relaxing 0 0 0 0  Restless 0 0 0 0  Easily annoyed or irritable 0 0 0 0  Afraid - awful might happen 0 0 0 0  Total GAD 7 Score 0 0 0 0  Anxiety Difficulty Not difficult at all Not difficult at all Not difficult at all Not difficult at all       10/17/2023    8:41 AM 09/18/2023    8:52 AM 08/01/2023    7:58 AM  Depression screen PHQ 2/9  Decreased Interest 0 0 0  Down, Depressed, Hopeless 0 0 0  PHQ - 2 Score 0 0 0  Altered sleeping 0 0 0  Tired, decreased energy 0 0 0  Change in appetite 0 0 0  Feeling bad or failure about yourself  0 0 0  Trouble concentrating 0 0 0  Moving slowly or fidgety/restless 0 0 0  Suicidal thoughts 0 0 0  PHQ-9 Score 0 0 0  Difficult doing work/chores Not difficult at all Not difficult at all Not difficult at all    BP Readings from Last 3 Encounters:  10/17/23 118/78  08/01/23 120/68  01/21/23 118/70    Physical Exam Vitals and nursing note reviewed.  Constitutional:      Appearance: He is well-developed.   HENT:     Head: Normocephalic and atraumatic.     Right Ear: Tympanic membrane, ear canal and external ear normal.     Left Ear: Tympanic membrane, ear canal and external ear normal.     Nose: Nose normal.     Mouth/Throat:     Dentition: Normal dentition.  Eyes:     General: Lids are normal. No scleral icterus.    Conjunctiva/sclera: Conjunctivae normal.     Pupils: Pupils are equal, round, and reactive to light.  Neck:     Thyroid: No  thyromegaly.     Vascular: No carotid bruit, hepatojugular reflux or JVD.     Trachea: No tracheal deviation.  Cardiovascular:     Rate and Rhythm: Normal rate and regular rhythm.     Heart sounds: Normal heart sounds. No murmur heard.    No gallop.  Pulmonary:     Effort: Pulmonary effort is normal.     Breath sounds: Normal breath sounds. No wheezing, rhonchi or rales.  Abdominal:     General: Bowel sounds are normal.     Palpations: Abdomen is soft. There is no hepatomegaly, splenomegaly or mass.     Tenderness: There is no abdominal tenderness.     Hernia: There is no hernia in the left inguinal area.  Musculoskeletal:     Cervical back: Normal range of motion and neck supple.  Lymphadenopathy:     Cervical: No cervical adenopathy.  Skin:    General: Skin is warm.  Neurological:     Mental Status: He is alert.     Deep Tendon Reflexes: Reflexes are normal and symmetric.  Psychiatric:        Mood and Affect: Mood is not anxious or depressed.     Wt Readings from Last 3 Encounters:  10/17/23 208 lb (94.3 kg)  08/01/23 229 lb (103.9 kg)  01/21/23 228 lb (103.4 kg)    BP 118/78   Pulse 79   Ht 6' (1.829 m)   Wt 208 lb (94.3 kg)   SpO2 97%   BMI 28.21 kg/m   Assessment and Plan:  1. Essential hypertension (Primary) Chronic.  Controlled.  Stable.  Asymptomatic.  Tolerating medications well.  Blood pressure today is 118/78.  Will continue amlodipine 5 mg once a day, losartan 50 mg once a day, and Toprol XL 25 mg once a day.   Patient has had weight loss by design if returns in 6 months and is maintaining blood pressure at current level perhaps with discussion would consider deprescribing one of the medications. - amLODipine (NORVASC) 5 MG tablet; TAKE 1 TABLET(5 MG) BY MOUTH DAILY  Dispense: 90 tablet; Refill: 1 - losartan (COZAAR) 50 MG tablet; Take 1 tablet (50 mg total) by mouth daily.  Dispense: 90 tablet; Refill: 1 - metoprolol succinate (TOPROL-XL) 25 MG 24 hr tablet; TAKE 1 TABLET(25 MG) BY MOUTH DAILY  Dispense: 90 tablet; Refill: 1  2. Mixed hyperlipidemia Chronic.  Controlled.  Stable.  Currently controlled with dietary approach and Lipitor 10 mg once a day.  Will continue at current dosing and will recheck lipid panel in 6 to 8 weeks - atorvastatin (LIPITOR) 10 MG tablet; Take 1 tablet daily  Dispense: 90 tablet; Refill: 1  3. Gastroesophageal reflux disease with esophagitis Chronic.  Controlled.  Stable.  Continue pantoprazole 40 mg once a day. - pantoprazole (PROTONIX) 40 MG tablet; TAKE 1 TABLET(40 MG) BY MOUTH DAILY  Dispense: 90 tablet; Refill: 1  4. Prediabetes Chronic.  Controlled.  Stable.  Prediabetic with A1c chronic.  Controlled.  Stable.  Continue dietary control of carbohydrates and sugars.  Will return for A1c in 4 to 6 weeks.   Elizabeth Sauer, MD

## 2023-10-31 ENCOUNTER — Telehealth: Payer: Self-pay

## 2023-10-31 ENCOUNTER — Other Ambulatory Visit: Payer: Self-pay

## 2023-10-31 DIAGNOSIS — R7303 Prediabetes: Secondary | ICD-10-CM

## 2023-10-31 NOTE — Telephone Encounter (Signed)
 Labs ordered and left at front desk.  KP  Copied from CRM [id number]. Topic: General - Other >> Oct 31, 2023  8:27 AM Truddie Crumble wrote: Reason for CRM: patient called stating he is coming by tomorrow to pick up paper work at 8am and to get his A1C done

## 2023-11-01 DIAGNOSIS — R7303 Prediabetes: Secondary | ICD-10-CM | POA: Diagnosis not present

## 2023-11-02 ENCOUNTER — Encounter: Payer: Self-pay | Admitting: Family Medicine

## 2023-11-02 LAB — HEMOGLOBIN A1C
Est. average glucose Bld gHb Est-mCnc: 128 mg/dL
Hgb A1c MFr Bld: 6.1 % — ABNORMAL HIGH (ref 4.8–5.6)

## 2023-11-12 DIAGNOSIS — H353211 Exudative age-related macular degeneration, right eye, with active choroidal neovascularization: Secondary | ICD-10-CM | POA: Diagnosis not present

## 2023-11-29 ENCOUNTER — Ambulatory Visit: Payer: Self-pay | Admitting: Family Medicine

## 2023-12-16 ENCOUNTER — Ambulatory Visit: Payer: Medicare Other | Admitting: Family Medicine

## 2024-01-07 DIAGNOSIS — H353211 Exudative age-related macular degeneration, right eye, with active choroidal neovascularization: Secondary | ICD-10-CM | POA: Diagnosis not present

## 2024-01-13 ENCOUNTER — Other Ambulatory Visit: Payer: Self-pay

## 2024-01-13 DIAGNOSIS — I10 Essential (primary) hypertension: Secondary | ICD-10-CM

## 2024-01-13 DIAGNOSIS — K2100 Gastro-esophageal reflux disease with esophagitis, without bleeding: Secondary | ICD-10-CM

## 2024-01-13 DIAGNOSIS — E782 Mixed hyperlipidemia: Secondary | ICD-10-CM

## 2024-01-13 MED ORDER — PANTOPRAZOLE SODIUM 40 MG PO TBEC
DELAYED_RELEASE_TABLET | ORAL | 0 refills | Status: DC
Start: 2024-01-13 — End: 2024-04-01

## 2024-01-13 MED ORDER — LOSARTAN POTASSIUM 50 MG PO TABS: 50.0000 mg | ORAL_TABLET | Freq: Every day | ORAL | 0 refills | Status: DC

## 2024-01-13 MED ORDER — ATORVASTATIN CALCIUM 10 MG PO TABS
ORAL_TABLET | ORAL | 0 refills | Status: DC
Start: 2024-01-13 — End: 2024-04-01

## 2024-01-13 MED ORDER — AMLODIPINE BESYLATE 5 MG PO TABS: ORAL_TABLET | ORAL | 0 refills | Status: DC

## 2024-01-13 MED ORDER — METOPROLOL SUCCINATE ER 25 MG PO TB24: ORAL_TABLET | ORAL | 0 refills | Status: DC

## 2024-03-10 DIAGNOSIS — H353211 Exudative age-related macular degeneration, right eye, with active choroidal neovascularization: Secondary | ICD-10-CM | POA: Diagnosis not present

## 2024-04-01 ENCOUNTER — Encounter: Payer: Self-pay | Admitting: Student

## 2024-04-01 ENCOUNTER — Ambulatory Visit (INDEPENDENT_AMBULATORY_CARE_PROVIDER_SITE_OTHER): Admitting: Student

## 2024-04-01 VITALS — BP 124/82 | HR 62 | Ht 72.0 in | Wt 187.0 lb

## 2024-04-01 DIAGNOSIS — I10 Essential (primary) hypertension: Secondary | ICD-10-CM

## 2024-04-01 DIAGNOSIS — K2100 Gastro-esophageal reflux disease with esophagitis, without bleeding: Secondary | ICD-10-CM

## 2024-04-01 DIAGNOSIS — K635 Polyp of colon: Secondary | ICD-10-CM | POA: Diagnosis not present

## 2024-04-01 DIAGNOSIS — E782 Mixed hyperlipidemia: Secondary | ICD-10-CM

## 2024-04-01 DIAGNOSIS — Z23 Encounter for immunization: Secondary | ICD-10-CM | POA: Diagnosis not present

## 2024-04-01 DIAGNOSIS — R7303 Prediabetes: Secondary | ICD-10-CM | POA: Diagnosis not present

## 2024-04-01 MED ORDER — ATORVASTATIN CALCIUM 10 MG PO TABS: 10.0000 mg | ORAL_TABLET | Freq: Every day | ORAL | 1 refills | Status: AC

## 2024-04-01 MED ORDER — LOSARTAN POTASSIUM 50 MG PO TABS: 50.0000 mg | ORAL_TABLET | Freq: Every day | ORAL | 0 refills | Status: AC

## 2024-04-01 MED ORDER — AMLODIPINE BESYLATE 5 MG PO TABS: ORAL_TABLET | ORAL | 1 refills | Status: AC

## 2024-04-01 MED ORDER — METOPROLOL SUCCINATE ER 25 MG PO TB24: ORAL_TABLET | ORAL | 0 refills | Status: AC

## 2024-04-01 MED ORDER — PANTOPRAZOLE SODIUM 40 MG PO TBEC: DELAYED_RELEASE_TABLET | ORAL | 0 refills | Status: AC

## 2024-04-01 NOTE — Assessment & Plan Note (Signed)
 Stable on pantoprazole 40 mg daily

## 2024-04-01 NOTE — Progress Notes (Signed)
 Established Patient Office Visit  Subjective   Patient ID: Juan Moreno, male    DOB: Dec 16, 1950  Age: 73 y.o. MRN: [id number]  Chief Complaint  Patient presents with   Establish Care    Patient is here today to establish care with new pcp    Elsie Jackee Christ with medical hx listed below presents today for transfer of care. He is feeling well today. Has been working hard on weight loss and dietary modification due to prediabetes. Estimates about 30 pound weight loss since January 2025. Is walking 14K steps and avoiding fried foods and sweets. Please refer to problem based charting for further details and assessment and plan of current problem and chronic medical conditions.   Patient Active Problem List   Diagnosis Date Noted   Prediabetes 04/01/2024   Polyp of ascending colon    Essential hypertension 03/21/2015   Hyperlipidemia 03/21/2015   Gastroesophageal reflux disease with esophagitis 03/21/2015      ROS Refer to HPI    Objective:     Outpatient Encounter Medications as of 04/01/2024  Medication Sig   loratadine  (CLARITIN ) 10 MG tablet Take 10 mg by mouth daily. otc   [DISCONTINUED] amLODipine  (NORVASC ) 5 MG tablet TAKE 1 TABLET(5 MG) BY MOUTH DAILY   [DISCONTINUED] atorvastatin  (LIPITOR) 10 MG tablet Take 1 tablet daily   [DISCONTINUED] losartan  (COZAAR ) 50 MG tablet Take 1 tablet (50 mg total) by mouth daily.   [DISCONTINUED] metoprolol  succinate (TOPROL -XL) 25 MG 24 hr tablet TAKE 1 TABLET(25 MG) BY MOUTH DAILY   [DISCONTINUED] pantoprazole  (PROTONIX ) 40 MG tablet TAKE 1 TABLET(40 MG) BY MOUTH DAILY   amLODipine  (NORVASC ) 5 MG tablet TAKE 1 TABLET(5 MG) BY MOUTH DAILY   atorvastatin  (LIPITOR) 10 MG tablet Take 1 tablet (10 mg total) by mouth daily. Take 1 tablet daily   losartan  (COZAAR ) 50 MG tablet Take 1 tablet (50 mg total) by mouth daily.   metoprolol  succinate (TOPROL -XL) 25 MG 24 hr tablet TAKE 1 TABLET(25 MG) BY MOUTH DAILY   pantoprazole   (PROTONIX ) 40 MG tablet TAKE 1 TABLET(40 MG) BY MOUTH DAILY   [DISCONTINUED] SODIUM FLUORIDE 5000 PPM 1.1 % PSTE at bedtime. (Patient not taking: Reported on 04/01/2024)   No facility-administered encounter medications on file as of 04/01/2024.    BP 124/82   Pulse 62   Ht 6' (1.829 m)   Wt 187 lb (84.8 kg)   SpO2 99%   BMI 25.36 kg/m  BP Readings from Last 3 Encounters:  04/01/24 124/82  10/17/23 118/78  08/01/23 120/68    Physical Exam Constitutional:      Appearance: Normal appearance.  HENT:     Head: Normocephalic and atraumatic.     Mouth/Throat:     Mouth: Mucous membranes are moist.     Pharynx: Oropharynx is clear.  Cardiovascular:     Rate and Rhythm: Normal rate and regular rhythm.     Heart sounds: No murmur heard. Pulmonary:     Effort: Pulmonary effort is normal.     Breath sounds: No rhonchi or rales.  Abdominal:     General: Abdomen is flat. Bowel sounds are normal. There is no distension.     Palpations: Abdomen is soft.     Tenderness: There is no abdominal tenderness.  Musculoskeletal:        General: Normal range of motion.     Right lower leg: No edema.     Left lower leg: No edema.  Skin:  General: Skin is warm and dry.     Capillary Refill: Capillary refill takes less than 2 seconds.  Neurological:     General: No focal deficit present.     Mental Status: He is alert and oriented to person, place, and time.  Psychiatric:        Mood and Affect: Mood normal.        Behavior: Behavior normal.        10/17/2023    8:41 AM 09/18/2023    8:52 AM 08/01/2023    7:58 AM  Depression screen PHQ 2/9  Decreased Interest 0 0 0  Down, Depressed, Hopeless 0 0 0  PHQ - 2 Score 0 0 0  Altered sleeping 0 0 0  Tired, decreased energy 0 0 0  Change in appetite 0 0 0  Feeling bad or failure about yourself  0 0 0  Trouble concentrating 0 0 0  Moving slowly or fidgety/restless 0 0 0  Suicidal thoughts 0 0 0  PHQ-9 Score 0 0 0  Difficult doing  work/chores Not difficult at all Not difficult at all Not difficult at all       10/17/2023    8:41 AM 08/01/2023    7:58 AM 01/21/2023    8:00 AM 07/27/2022    3:54 PM  GAD 7 : Generalized Anxiety Score  Nervous, Anxious, on Edge 0 0 0 0  Control/stop worrying 0 0 0 0  Worry too much - different things 0 0 0 0  Trouble relaxing 0 0 0 0  Restless 0 0 0 0  Easily annoyed or irritable 0 0 0 0  Afraid - awful might happen 0 0 0 0  Total GAD 7 Score 0 0 0 0  Anxiety Difficulty Not difficult at all Not difficult at all Not difficult at all Not difficult at all    No results found for any visits on 04/01/24.  Last CBC Lab Results  Component Value Date   WBC 11.0 (H) 11/01/2020   HGB 16.3 11/01/2020   HCT 47.0 11/01/2020   MCV 84.7 11/01/2020   MCH 29.4 11/01/2020   RDW 13.7 11/01/2020   PLT 265 11/01/2020   Last metabolic panel Lab Results  Component Value Date   GLUCOSE 96 08/01/2023   NA 136 08/01/2023   K 4.6 08/01/2023   CL 98 08/01/2023   CO2 24 08/01/2023   BUN 17 08/01/2023   CREATININE 1.33 (H) 08/01/2023   EGFR 56 (L) 08/01/2023   CALCIUM  9.4 08/01/2023   PHOS 3.5 08/01/2023   PROT 7.7 01/21/2023   ALBUMIN 4.3 08/01/2023   LABGLOB 3.3 01/21/2023   AGRATIO 1.6 07/20/2021   BILITOT 1.2 01/21/2023   ALKPHOS 98 01/21/2023   AST 27 01/21/2023   ALT 20 01/21/2023   ANIONGAP 8 11/01/2020   Last lipids Lab Results  Component Value Date   CHOL 115 08/01/2023   HDL 40 08/01/2023   LDLCALC 54 08/01/2023   TRIG 115 08/01/2023   CHOLHDL 2.6 02/16/2019   Last hemoglobin A1c Lab Results  Component Value Date   HGBA1C 6.1 (H) 11/01/2023      The ASCVD Risk score (Arnett DK, et al., 2019) failed to calculate for the following reasons:   The valid total cholesterol range is 130 to 320 mg/dL    Assessment & Plan:  Gastroesophageal reflux disease with esophagitis without hemorrhage Assessment & Plan: Stable on pantoprazole  40 mg daily.    Essential  hypertension Assessment & Plan:  Well controlled on losartan  50 mg daily, amlodipine  5 mg, and metoprolol  succinate 25 mg daily. Continue current medications. BMP today.   Orders: -     amLODIPine  Besylate; TAKE 1 TABLET(5 MG) BY MOUTH DAILY  Dispense: 90 tablet; Refill: 1 -     Losartan  Potassium; Take 1 tablet (50 mg total) by mouth daily.  Dispense: 90 tablet; Refill: 0 -     Metoprolol  Succinate ER; TAKE 1 TABLET(25 MG) BY MOUTH DAILY  Dispense: 90 tablet; Refill: 0 -     Basic metabolic panel with GFR  Mixed hyperlipidemia -     Atorvastatin  Calcium ; Take 1 tablet (10 mg total) by mouth daily. Take 1 tablet daily  Dispense: 90 tablet; Refill: 1 -     Lipid panel  Gastroesophageal reflux disease with esophagitis Assessment & Plan: Stable on pantoprazole  40 mg daily.   Orders: -     Pantoprazole  Sodium; TAKE 1 TABLET(40 MG) BY MOUTH DAILY  Dispense: 90 tablet; Refill: 0  Polyp of ascending colon, unspecified type Assessment & Plan: Last colon in 11/2021, repeat in 10 years   Prediabetes Assessment & Plan: Last A1c 6.1 in 10/2023. Has had significant intentional weight loss since then. Now is maintaining weight per patient. Denies polydipsia or polyuria. A1c today.   Orders: -     Hemoglobin A1c  Encounter for immunization -     Flu vaccine HIGH DOSE PF(Fluzone Trivalent)     Return in about 6 months (around 09/29/2024) for HTN, HLD, prediabetes .    Harlene Saddler, MD

## 2024-04-01 NOTE — Assessment & Plan Note (Signed)
 Last colon in 11/2021, repeat in 10 years

## 2024-04-01 NOTE — Assessment & Plan Note (Signed)
 Last A1c 6.1 in 10/2023. Has had significant intentional weight loss since then. Now is maintaining weight per patient. Denies polydipsia or polyuria. A1c today.

## 2024-04-01 NOTE — Assessment & Plan Note (Signed)
 Well controlled on losartan  50 mg daily, amlodipine  5 mg, and metoprolol  succinate 25 mg daily. Continue current medications. BMP today.

## 2024-04-02 ENCOUNTER — Ambulatory Visit: Payer: Self-pay | Admitting: Student

## 2024-04-02 LAB — BASIC METABOLIC PANEL WITH GFR
BUN/Creatinine Ratio: 13 (ref 10–24)
BUN: 18 mg/dL (ref 8–27)
CO2: 25 mmol/L (ref 20–29)
Calcium: 10.3 mg/dL — ABNORMAL HIGH (ref 8.6–10.2)
Chloride: 95 mmol/L — ABNORMAL LOW (ref 96–106)
Creatinine, Ser: 1.4 mg/dL — ABNORMAL HIGH (ref 0.76–1.27)
Glucose: 97 mg/dL (ref 70–99)
Potassium: 5.1 mmol/L (ref 3.5–5.2)
Sodium: 135 mmol/L (ref 134–144)
eGFR: 52 mL/min/1.73 — ABNORMAL LOW (ref >59)

## 2024-04-02 LAB — LIPID PANEL
Chol/HDL Ratio: 2.3 ratio (ref 0.0–5.0)
Cholesterol, Total: 108 mg/dL (ref 100–199)
HDL: 46 mg/dL (ref >39)
LDL Chol Calc (NIH): 46 mg/dL (ref 0–99)
Triglycerides: 76 mg/dL (ref 0–149)
VLDL Cholesterol Cal: 16 mg/dL (ref 5–40)

## 2024-04-02 LAB — HEMOGLOBIN A1C
Est. average glucose Bld gHb Est-mCnc: 126 mg/dL
Hgb A1c MFr Bld: 6 % — ABNORMAL HIGH (ref 4.8–5.6)

## 2024-04-05 ENCOUNTER — Encounter: Payer: Self-pay | Admitting: Student

## 2024-04-06 ENCOUNTER — Other Ambulatory Visit: Payer: Self-pay

## 2024-04-06 MED ORDER — COVID-19 MRNA VAC-TRIS(PFIZER) 30 MCG/0.3ML IM SUSY: 0.3000 mL | PREFILLED_SYRINGE | Freq: Once | INTRAMUSCULAR | 0 refills | Status: AC

## 2024-04-06 NOTE — Telephone Encounter (Signed)
 Please review.  KP

## 2024-04-06 NOTE — Telephone Encounter (Unsigned)
 Copied from CRM [id number]. Topic: Clinical - Prescription Issue >> Apr 06, 2024 11:32 AM DeAngela L wrote: Reason for CRM: patient calling to inform the office he needs a prescription for the covid vaccine,and would like to ask when can he come to pick it up in office   Pt num [phone] (M)

## 2024-04-07 DIAGNOSIS — Z23 Encounter for immunization: Secondary | ICD-10-CM | POA: Diagnosis not present

## 2024-05-12 DIAGNOSIS — H353211 Exudative age-related macular degeneration, right eye, with active choroidal neovascularization: Secondary | ICD-10-CM | POA: Diagnosis not present

## 2024-05-12 DIAGNOSIS — H35711 Central serous chorioretinopathy, right eye: Secondary | ICD-10-CM | POA: Diagnosis not present

## 2024-05-12 DIAGNOSIS — H35051 Retinal neovascularization, unspecified, right eye: Secondary | ICD-10-CM | POA: Diagnosis not present

## 2024-05-12 DIAGNOSIS — H2513 Age-related nuclear cataract, bilateral: Secondary | ICD-10-CM | POA: Diagnosis not present

## 2024-06-08 ENCOUNTER — Encounter: Payer: Self-pay | Admitting: Student

## 2024-06-08 ENCOUNTER — Ambulatory Visit (INDEPENDENT_AMBULATORY_CARE_PROVIDER_SITE_OTHER): Admitting: Student

## 2024-06-08 VITALS — BP 118/72 | HR 93 | Temp 97.7°F | Ht 72.0 in | Wt 189.0 lb

## 2024-06-08 DIAGNOSIS — R051 Acute cough: Secondary | ICD-10-CM | POA: Insufficient documentation

## 2024-06-08 LAB — POC COVID19/FLU A&B COMBO
Covid Antigen, POC: NEGATIVE
Influenza A Antigen, POC: NEGATIVE
Influenza B Antigen, POC: NEGATIVE

## 2024-06-08 MED ORDER — BENZONATATE 100 MG PO CAPS: 100.0000 mg | ORAL_CAPSULE | Freq: Three times a day (TID) | ORAL | 0 refills | Status: DC | PRN

## 2024-06-08 NOTE — Progress Notes (Signed)
 Established Patient Office Visit  Subjective   Patient ID: Juan Moreno, male    DOB: April 26, 1951  Age: 73 y.o. MRN: [id number]  Chief Complaint  Patient presents with   Nasal Congestion   Cough    Friday night started having coughing, runny nose    Elsie Jackee Christ with medical hx listed below presents today for sinus congestion and rhinorrhea for the past 3 days. Started after going to a football game. Has associated cough producing small amount of sputum. Reports coughing more at night. He denies fevers, chills, CP dyspnea, sore throat, n/v/d, abdominal pain, loss of tastes/smell, lightheadedness, and dizziness.   Patient Active Problem List   Diagnosis Date Noted   Acute cough 06/08/2024   Prediabetes 04/01/2024   Polyp of ascending colon    Essential hypertension 03/21/2015   Hyperlipidemia 03/21/2015   Gastroesophageal reflux disease with esophagitis 03/21/2015      ROS Refer to HPI    Objective:     Outpatient Encounter Medications as of 06/08/2024  Medication Sig   amLODipine  (NORVASC ) 5 MG tablet TAKE 1 TABLET(5 MG) BY MOUTH DAILY   atorvastatin  (LIPITOR) 10 MG tablet Take 1 tablet (10 mg total) by mouth daily. Take 1 tablet daily   benzonatate (TESSALON) 100 MG capsule Take 1 capsule (100 mg total) by mouth 3 (three) times daily as needed for cough.   loratadine  (CLARITIN ) 10 MG tablet Take 10 mg by mouth daily. otc   losartan  (COZAAR ) 50 MG tablet Take 1 tablet (50 mg total) by mouth daily.   metoprolol  succinate (TOPROL -XL) 25 MG 24 hr tablet TAKE 1 TABLET(25 MG) BY MOUTH DAILY   pantoprazole  (PROTONIX ) 40 MG tablet TAKE 1 TABLET(40 MG) BY MOUTH DAILY   SODIUM FLUORIDE 5000 PPM 1.1 % PSTE USE TO BRUSH TEETH NIGHTLY. DO NOT RINSE MOUTH AFTER USE NPOFO R30 MINUTES AFTER USE   No facility-administered encounter medications on file as of 06/08/2024.    BP 118/72   Pulse 93   Temp 97.7 F (36.5 C) (Oral)   Ht 6' (1.829 m)   Wt 189 lb (85.7  kg)   SpO2 97%   BMI 25.63 kg/m  BP Readings from Last 3 Encounters:  06/08/24 118/72  04/01/24 124/82  10/17/23 118/78    Physical Exam Constitutional:      Appearance: Normal appearance. He is ill-appearing.  HENT:     Nose: Congestion present. No rhinorrhea.     Mouth/Throat:     Mouth: Mucous membranes are moist.     Pharynx: Oropharynx is clear. Posterior oropharyngeal erythema present. No oropharyngeal exudate.  Eyes:     Extraocular Movements: Extraocular movements intact.     Pupils: Pupils are equal, round, and reactive to light.  Cardiovascular:     Rate and Rhythm: Normal rate and regular rhythm.  Pulmonary:     Effort: Pulmonary effort is normal. No respiratory distress.     Breath sounds: Normal breath sounds. No rhonchi or rales.  Abdominal:     General: Abdomen is flat. Bowel sounds are normal. There is no distension.     Palpations: Abdomen is soft.     Tenderness: There is no abdominal tenderness.  Musculoskeletal:        General: Normal range of motion.     Right lower leg: No edema.     Left lower leg: No edema.  Lymphadenopathy:     Cervical: No cervical adenopathy.  Skin:    General: Skin is warm and  dry.     Capillary Refill: Capillary refill takes less than 2 seconds.  Neurological:     General: No focal deficit present.     Mental Status: He is alert and oriented to person, place, and time.  Psychiatric:        Mood and Affect: Mood normal.        Behavior: Behavior normal.        04/01/2024    8:46 AM 10/17/2023    8:41 AM 09/18/2023    8:52 AM  Depression screen PHQ 2/9  Decreased Interest 0 0 0  Down, Depressed, Hopeless 0 0 0  PHQ - 2 Score 0 0 0  Altered sleeping 1 0 0  Tired, decreased energy 0 0 0  Change in appetite 0 0 0  Feeling bad or failure about yourself  0 0 0  Trouble concentrating 0 0 0  Moving slowly or fidgety/restless 0 0 0  Suicidal thoughts 0 0 0  PHQ-9 Score 1  0  0   Difficult doing work/chores Not difficult  at all Not difficult at all Not difficult at all     Data saved with a previous flowsheet row definition       04/01/2024    8:46 AM 10/17/2023    8:41 AM 08/01/2023    7:58 AM 01/21/2023    8:00 AM  GAD 7 : Generalized Anxiety Score  Nervous, Anxious, on Edge 0 0 0 0  Control/stop worrying 0 0 0 0  Worry too much - different things 0 0 0 0  Trouble relaxing 0 0 0 0  Restless 0 0 0 0  Easily annoyed or irritable 0 0 0 0  Afraid - awful might happen 0 0 0 0  Total GAD 7 Score 0 0 0 0  Anxiety Difficulty Not difficult at all Not difficult at all Not difficult at all Not difficult at all    Results for orders placed or performed in visit on 06/08/24  POC Covid19/Flu A&B Antigen  Result Value Ref Range   Influenza A Antigen, POC Negative Negative   Influenza B Antigen, POC Negative Negative   Covid Antigen, POC Negative Negative    Last CBC Lab Results  Component Value Date   WBC 11.0 (H) 11/01/2020   HGB 16.3 11/01/2020   HCT 47.0 11/01/2020   MCV 84.7 11/01/2020   MCH 29.4 11/01/2020   RDW 13.7 11/01/2020   PLT 265 11/01/2020   Last metabolic panel Lab Results  Component Value Date   GLUCOSE 97 04/01/2024   NA 135 04/01/2024   K 5.1 04/01/2024   CL 95 (L) 04/01/2024   CO2 25 04/01/2024   BUN 18 04/01/2024   CREATININE 1.40 (H) 04/01/2024   EGFR 52 (L) 04/01/2024   CALCIUM  10.3 (H) 04/01/2024   PHOS 3.5 08/01/2023   PROT 7.7 01/21/2023   ALBUMIN 4.3 08/01/2023   LABGLOB 3.3 01/21/2023   AGRATIO 1.6 07/20/2021   BILITOT 1.2 01/21/2023   ALKPHOS 98 01/21/2023   AST 27 01/21/2023   ALT 20 01/21/2023   ANIONGAP 8 11/01/2020   Last lipids Lab Results  Component Value Date   CHOL 108 04/01/2024   HDL 46 04/01/2024   LDLCALC 46 04/01/2024   TRIG 76 04/01/2024   CHOLHDL 2.3 04/01/2024   Last hemoglobin A1c Lab Results  Component Value Date   HGBA1C 6.0 (H) 04/01/2024      The ASCVD Risk score (Arnett DK, et al., 2019) failed to  calculate for the  following reasons:   The valid total cholesterol range is 130 to 320 mg/dL    Assessment & Plan:  Acute cough Sx for the past 3 days. Likely viral sinusitis. Stable vitals on exam today. Supportive care with tylenol . Nasal irrigation, intranasal corticosteroids, and tessalon as needed for cough at night. Return precautions reviewed for fever, dyspnea, wheezing, worsening sx.  -     POC Covid19/Flu A&B Antigen  Other orders -     Benzonatate; Take 1 capsule (100 mg total) by mouth 3 (three) times daily as needed for cough.  Dispense: 20 capsule; Refill: 0     Return if symptoms worsen or fail to improve.    Harlene Saddler, MD

## 2024-06-09 NOTE — Telephone Encounter (Signed)
Please review and advise patient.

## 2024-06-17 ENCOUNTER — Ambulatory Visit (INDEPENDENT_AMBULATORY_CARE_PROVIDER_SITE_OTHER): Admitting: Internal Medicine

## 2024-06-17 ENCOUNTER — Ambulatory Visit: Payer: Self-pay

## 2024-06-17 ENCOUNTER — Encounter: Payer: Self-pay | Admitting: Internal Medicine

## 2024-06-17 VITALS — BP 136/74 | HR 92 | Temp 97.9°F | Ht 72.0 in | Wt 189.0 lb

## 2024-06-17 DIAGNOSIS — J0100 Acute maxillary sinusitis, unspecified: Secondary | ICD-10-CM | POA: Diagnosis not present

## 2024-06-17 MED ORDER — BENZONATATE 100 MG PO CAPS: 100.0000 mg | ORAL_CAPSULE | Freq: Three times a day (TID) | ORAL | 0 refills | Status: AC | PRN

## 2024-06-17 MED ORDER — AZITHROMYCIN 250 MG PO TABS: ORAL_TABLET | ORAL | 0 refills | Status: AC

## 2024-06-17 NOTE — Progress Notes (Signed)
 Date:  06/17/2024   Name:  Juan Moreno   DOB:  [birth year]   MRN:  [id number]   Chief Complaint: Cough  Cough This is a recurrent problem. The current episode started yesterday. The problem has been gradually worsening. The problem occurs every few minutes. The cough is Non-productive. Associated symptoms include a sore throat. Pertinent negatives include no chest pain, chills, ear pain, fever, headaches, shortness of breath or wheezing. Associated symptoms comments: Sinus and head congestion.  Sinus Problem This is a new problem. The current episode started yesterday. There has been no fever. Associated symptoms include congestion, coughing, sinus pressure, a sore throat and swollen glands. Pertinent negatives include no chills, ear pain, headaches or shortness of breath. Past treatments include oral decongestants. The treatment provided mild relief.    Review of Systems  Constitutional:  Positive for fatigue. Negative for chills and fever.  HENT:  Positive for congestion, sinus pressure and sore throat. Negative for ear pain.   Respiratory:  Positive for cough. Negative for chest tightness, shortness of breath and wheezing.   Cardiovascular:  Negative for chest pain.  Gastrointestinal:  Negative for nausea and vomiting.  Neurological:  Negative for headaches.  Psychiatric/Behavioral:  Positive for sleep disturbance. Negative for dysphoric mood. The patient is not nervous/anxious.      Lab Results  Component Value Date   NA 135 04/01/2024   K 5.1 04/01/2024   CO2 25 04/01/2024   GLUCOSE 97 04/01/2024   BUN 18 04/01/2024   CREATININE 1.40 (H) 04/01/2024   CALCIUM  10.3 (H) 04/01/2024   EGFR 52 (L) 04/01/2024   GFRNONAA 54 (L) 11/01/2020   Lab Results  Component Value Date   CHOL 108 04/01/2024   HDL 46 04/01/2024   LDLCALC 46 04/01/2024   TRIG 76 04/01/2024   CHOLHDL 2.3 04/01/2024   Lab Results  Component Value Date   TSH 1.943 02/12/2018   Lab Results   Component Value Date   HGBA1C 6.0 (H) 04/01/2024   Lab Results  Component Value Date   WBC 11.0 (H) 11/01/2020   HGB 16.3 11/01/2020   HCT 47.0 11/01/2020   MCV 84.7 11/01/2020   PLT 265 11/01/2020   Lab Results  Component Value Date   ALT 20 01/21/2023   AST 27 01/21/2023   ALKPHOS 98 01/21/2023   BILITOT 1.2 01/21/2023   No results found for: MARIEN BOLLS, VD25OH   Patient Active Problem List   Diagnosis Date Noted   Acute cough 06/08/2024   Prediabetes 04/01/2024   Polyp of ascending colon    Essential hypertension 03/21/2015   Hyperlipidemia 03/21/2015   Gastroesophageal reflux disease with esophagitis 03/21/2015    Allergies  Allergen Reactions   Penicillins Rash    Past Surgical History:  Procedure Laterality Date   COLONOSCOPY  2013   cleared for 4 yrs- Dr Viktoria- [facility]   COLONOSCOPY WITH PROPOFOL  N/A 12/26/2016   Procedure: COLONOSCOPY WITH PROPOFOL ;  Surgeon: Viktoria Lamar DASEN, MD;  Location: [facility] ENDOSCOPY;  Service: Endoscopy;  Laterality: N/A;   COLONOSCOPY WITH PROPOFOL  N/A 12/14/2021   Procedure: COLONOSCOPY WITH PROPOFOL ;  Surgeon: Unk Corinn Skiff, MD;  Location: [facility];  Service: Endoscopy;  Laterality: N/A;   POLYPECTOMY  12/14/2021   Procedure: POLYPECTOMY;  Surgeon: Unk Corinn Skiff, MD;  Location: [facility];  Service: Endoscopy;;   STAPEDES SURGERY      Social History   Tobacco Use   Smoking status: Never    Passive  exposure: Never   Smokeless tobacco: Former    Types: Engineer, Drilling   Vaping status: Never Used  Substance Use Topics   Alcohol use: Yes    Alcohol/week: 0.0 standard drinks of alcohol    Comment: rare   Drug use: No     Medication list has been reviewed and updated.  Current Meds  Medication Sig   amLODipine  (NORVASC ) 5 MG tablet TAKE 1 TABLET(5 MG) BY MOUTH DAILY   atorvastatin  (LIPITOR) 10 MG tablet Take 1 tablet (10 mg total) by mouth daily. Take 1 tablet daily    azithromycin  (ZITHROMAX  Z-PAK) 250 MG tablet UAD   loratadine  (CLARITIN ) 10 MG tablet Take 10 mg by mouth daily. otc   losartan  (COZAAR ) 50 MG tablet Take 1 tablet (50 mg total) by mouth daily.   metoprolol  succinate (TOPROL -XL) 25 MG 24 hr tablet TAKE 1 TABLET(25 MG) BY MOUTH DAILY   pantoprazole  (PROTONIX ) 40 MG tablet TAKE 1 TABLET(40 MG) BY MOUTH DAILY   SODIUM FLUORIDE 5000 PPM 1.1 % PSTE USE TO BRUSH TEETH NIGHTLY. DO NOT RINSE MOUTH AFTER USE NPOFO R30 MINUTES AFTER USE   [DISCONTINUED] benzonatate (TESSALON) 100 MG capsule Take 1 capsule (100 mg total) by mouth 3 (three) times daily as needed for cough.       06/17/2024    2:17 PM 04/01/2024    8:46 AM 10/17/2023    8:41 AM 08/01/2023    7:58 AM  GAD 7 : Generalized Anxiety Score  Nervous, Anxious, on Edge 0 0 0 0  Control/stop worrying 0 0 0 0  Worry too much - different things 0 0 0 0  Trouble relaxing 0 0 0 0  Restless 0 0 0 0  Easily annoyed or irritable 0 0 0 0  Afraid - awful might happen 0 0 0 0  Total GAD 7 Score 0 0 0 0  Anxiety Difficulty Not difficult at all Not difficult at all Not difficult at all Not difficult at all       06/17/2024    2:17 PM 04/01/2024    8:46 AM 10/17/2023    8:41 AM  Depression screen PHQ 2/9  Decreased Interest 0 0 0  Down, Depressed, Hopeless 0 0 0  PHQ - 2 Score 0 0 0  Altered sleeping 1 1 0  Tired, decreased energy 0 0 0  Change in appetite 0 0 0  Feeling bad or failure about yourself  0 0 0  Trouble concentrating 0 0 0  Moving slowly or fidgety/restless 0 0 0  Suicidal thoughts 0 0 0  PHQ-9 Score 1 1  0   Difficult doing work/chores Not difficult at all Not difficult at all Not difficult at all     Data saved with a previous flowsheet row definition    BP Readings from Last 3 Encounters:  06/17/24 136/74  06/08/24 118/72  04/01/24 124/82    Physical Exam Vitals and nursing note reviewed.  Constitutional:      General: He is not in acute distress.    Appearance: Normal  appearance. He is well-developed.  HENT:     Head: Normocephalic and atraumatic.     Right Ear: Tympanic membrane is retracted. Tympanic membrane is not erythematous.     Left Ear: Tympanic membrane is retracted. Tympanic membrane is not erythematous.     Nose:     Right Sinus: Maxillary sinus tenderness present. No frontal sinus tenderness.     Left Sinus: Maxillary sinus  tenderness present. No frontal sinus tenderness.     Mouth/Throat:     Pharynx: Posterior oropharyngeal erythema present. No oropharyngeal exudate.  Cardiovascular:     Rate and Rhythm: Normal rate and regular rhythm.     Pulses: Normal pulses.  Pulmonary:     Effort: Pulmonary effort is normal. No respiratory distress.     Breath sounds: Normal breath sounds. No wheezing or rhonchi.  Musculoskeletal:     Cervical back: Normal range of motion.  Lymphadenopathy:     Cervical: No cervical adenopathy.  Skin:    General: Skin is warm and dry.     Findings: No rash.  Neurological:     Mental Status: He is alert and oriented to person, place, and time.  Psychiatric:        Mood and Affect: Mood normal.        Behavior: Behavior normal.     Wt Readings from Last 3 Encounters:  06/17/24 189 lb (85.7 kg)  06/08/24 189 lb (85.7 kg)  04/01/24 187 lb (84.8 kg)    BP 136/74   Pulse 92   Temp 97.9 F (36.6 C) (Oral)   Ht 6' (1.829 m)   Wt 189 lb (85.7 kg)   SpO2 99%   BMI 25.63 kg/m   Assessment and Plan:  Problem List Items Addressed This Visit   None Visit Diagnoses       Acute non-recurrent maxillary sinusitis    -  Primary   take Coricidin HBP twice a day and Claritin  in the evening Zpak and refill Tessalon perles   Relevant Medications   azithromycin  (ZITHROMAX  Z-PAK) 250 MG tablet   benzonatate (TESSALON) 100 MG capsule       No follow-ups on file.    Leita HILARIO Adie, MD [facility] [place]

## 2024-06-17 NOTE — Telephone Encounter (Signed)
 Patient was seen on 06/08/2024  2 tablets left for cough Patient states he started to feel a little better but then started getting worse yesterday Still coughing a lot Using Nasacort spray    FYI Only or Action Required?: FYI only for provider: appointment scheduled on 06/17/2024 at 2pm with Dr Leita Adie.  Patient was last seen in primary care on 06/08/2024 by Juan Raisin, MD.  Called Nurse Triage reporting Cough.  Symptoms began almost two weeks ago, got slightly better, then back worse yesterday.  Interventions attempted: OTC medications: nasacort nasal spray, Prescription medications: benzonatate prescribed on 06/08/2024, and Rest, hydration, or home remedies.  Symptoms are: gradually worsening.  Triage Disposition: See Physician Within 24 Hours  Patient/caregiver understands and will follow disposition?: Yes         Summary: sinus head and pressure -congestion runny nose -coughing up mucus -not getting better   Reason for Triage: patient calling in was in office on 06/08/24 Symptoms: -was given medication and has 2 pills left -sinus head and pressure -congestion runny nose -coughing up mucus -not getting better -no fever -no chills       Reason for Disposition  [1] Continuous (nonstop) coughing interferes with work or school AND [2] no improvement using cough treatment per Care Advice  Answer Assessment - Initial Assessment Questions Patient was seen on 06/08/2024  2 tablets left for cough Patient states he started to feel a little better but then started getting worse yesterday Clear and yellowish phlegm now Still having sinus pressure/congestion in head Blew nose this morning---saw a little bit of blood   5. DIFFICULTY BREATHING: Are you having difficulty breathing? If Yes, ask: How bad is it? (e.g., mild, moderate, severe)      no 6. FEVER: Do you have a fever? If Yes, ask: What is your temperature, how was it measured, and when did  it start?    no  Protocols used: Cough - Acute Productive-A-AH

## 2024-06-17 NOTE — Patient Instructions (Addendum)
 Take Coricidin HBP morning and afternoon  Take Claritin  every evening.

## 2024-06-29 DIAGNOSIS — H353211 Exudative age-related macular degeneration, right eye, with active choroidal neovascularization: Secondary | ICD-10-CM | POA: Diagnosis not present

## 2024-09-24 ENCOUNTER — Encounter

## 2024-10-07 ENCOUNTER — Ambulatory Visit: Admitting: Student
